# Patient Record
Sex: Male | Born: 1955
Health system: Southern US, Community
[De-identification: ages and names within clinical notes are randomized; demographics above are authoritative.]

## PROBLEM LIST (undated history)

## (undated) DIAGNOSIS — T7840XA Allergy, unspecified, initial encounter: Secondary | ICD-10-CM

## (undated) DIAGNOSIS — E119 Type 2 diabetes mellitus without complications: Secondary | ICD-10-CM

## (undated) DIAGNOSIS — Z9889 Other specified postprocedural states: Secondary | ICD-10-CM

## (undated) DIAGNOSIS — F419 Anxiety disorder, unspecified: Secondary | ICD-10-CM

## (undated) DIAGNOSIS — M1990 Unspecified osteoarthritis, unspecified site: Secondary | ICD-10-CM

## (undated) DIAGNOSIS — Z91018 Allergy to other foods: Secondary | ICD-10-CM

## (undated) DIAGNOSIS — L509 Urticaria, unspecified: Secondary | ICD-10-CM

## (undated) DIAGNOSIS — C61 Malignant neoplasm of prostate: Secondary | ICD-10-CM

## (undated) DIAGNOSIS — Z87442 Personal history of urinary calculi: Secondary | ICD-10-CM

## (undated) DIAGNOSIS — R112 Nausea with vomiting, unspecified: Secondary | ICD-10-CM

## (undated) HISTORY — PX: ADENOIDECTOMY: SUR15

## (undated) HISTORY — DX: Allergy to other foods: Z91.018

## (undated) HISTORY — DX: Allergy, unspecified, initial encounter: T78.40XA

## (undated) HISTORY — DX: Type 2 diabetes mellitus without complications: E11.9

## (undated) HISTORY — DX: Urticaria, unspecified: L50.9

## (undated) HISTORY — PX: COLONOSCOPY: SHX174

## (undated) HISTORY — DX: Malignant neoplasm of prostate: C61

## (undated) HISTORY — PX: TONSILLECTOMY: SUR1361

---

## 1988-02-16 HISTORY — PX: SHOULDER SURGERY: SHX246

## 1998-02-15 HISTORY — PX: CHOLECYSTECTOMY: SHX55

## 2010-02-15 LAB — HM COLONOSCOPY: HM Colonoscopy: NORMAL

## 2013-11-24 ENCOUNTER — Ambulatory Visit: Payer: Self-pay

## 2014-03-18 DIAGNOSIS — C61 Malignant neoplasm of prostate: Secondary | ICD-10-CM

## 2014-03-18 HISTORY — DX: Malignant neoplasm of prostate: C61

## 2014-04-18 ENCOUNTER — Other Ambulatory Visit: Payer: Self-pay | Admitting: Urology

## 2014-05-03 ENCOUNTER — Telehealth: Payer: Self-pay

## 2014-05-03 NOTE — Telephone Encounter (Signed)
LM for patient to return the call.  

## 2014-05-06 ENCOUNTER — Ambulatory Visit (INDEPENDENT_AMBULATORY_CARE_PROVIDER_SITE_OTHER): Payer: 59 | Admitting: Family

## 2014-05-06 ENCOUNTER — Encounter: Payer: Self-pay | Admitting: Family

## 2014-05-06 VITALS — BP 118/78 | HR 80 | Temp 98.4°F | Resp 16 | Ht 68.0 in | Wt 207.8 lb

## 2014-05-06 DIAGNOSIS — E119 Type 2 diabetes mellitus without complications: Secondary | ICD-10-CM | POA: Diagnosis not present

## 2014-05-06 DIAGNOSIS — G4700 Insomnia, unspecified: Secondary | ICD-10-CM

## 2014-05-06 DIAGNOSIS — J302 Other seasonal allergic rhinitis: Secondary | ICD-10-CM

## 2014-05-06 DIAGNOSIS — C61 Malignant neoplasm of prostate: Secondary | ICD-10-CM

## 2014-05-06 DIAGNOSIS — Z0000 Encounter for general adult medical examination without abnormal findings: Secondary | ICD-10-CM

## 2014-05-06 MED ORDER — SITAGLIPTIN PHOSPHATE 100 MG PO TABS: 100.0000 mg | ORAL_TABLET | Freq: Every day | ORAL | Status: DC

## 2014-05-06 MED ORDER — ZOLPIDEM TARTRATE 10 MG PO TABS: 10.0000 mg | ORAL_TABLET | Freq: Every evening | ORAL | Status: DC | PRN

## 2014-05-06 NOTE — Patient Instructions (Signed)
Please schedule fasting lab draw at the front. Stop metformin, start januvia.  Follow up in 3 months. Welcome to [facility]!

## 2014-05-06 NOTE — Progress Notes (Signed)
Subjective:    Patient ID: Larry Davis, male    DOB: 1955/04/14, 59 y.o.   MRN: [id number]  HPI  [name] is a 59 yr old male who presents today to establish care.  1) DM2- was diagnosed 2 years ago. On Metformin. Generally sugars run 160-180. Denies hypoglycemia.  Last A1C was 5 months ago.   2) Prostate cancer- diagnosed a few weeks ago.  Followed at [facility]. He will have robotic prostatectomy with Dr. Tresa Moore in the end of April.  3) allergic rhinitis- reports that he was recent sinus infection and was treated with amoxicillin. He takes Allegra D.  Also using flonase.  Feeling much better.   4) Insomnia- uses ambien rarely.    Review of Systems  Constitutional:       Has gained 8-9 pounds in the last 6 months.    HENT:       Has hearing aids  Eyes:       Wears glasses  Respiratory: Negative for shortness of breath.   Cardiovascular: Negative for chest pain and leg swelling.  Gastrointestinal: Negative for constipation.       Diarrhea secondary to metformin  Genitourinary: Negative for dysuria and frequency.  Musculoskeletal: Positive for arthralgias.       Some right knee pain  Skin: Positive for rash.  Neurological:       Occasional sinus headaches  Hematological: Negative for adenopathy.  Psychiatric/Behavioral:       Denies depression/anxiety   Past Medical History  Diagnosis Date  . Diabetes mellitus without complication   . Prostate cancer 03/2014  . Allergy     History   Social History  . Marital Status: Married    Spouse Name: N/A  . Number of Children: N/A  . Years of Education: N/A   Occupational History  . Not on file.   Social History Main Topics  . Smoking status: Never Smoker   . Smokeless tobacco: Never Used  . Alcohol Use: No  . Drug Use: No  . Sexual Activity: Not on file   Other Topics Concern  . 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 Cholecystectomy  2000  . Shoulder surgery Right 1990    arthroscopy    Family History  Problem Relation Age of Onset  . Heart disease Mother   . Diabetes Mother   . Arthritis Mother   . Hypertension Mother   . Heart disease Father   . Hypertension Father   . Diabetes Father   . Arthritis Father   . Cancer Maternal Uncle     prostate  . Cancer Maternal Grandmother     cervical    No Known Allergies  No current outpatient prescriptions on file prior to visit.   No current facility-administered medications on file prior to visit.    BP 118/78 mmHg  Pulse 80  Temp(Src) 98.4 F (36.9 C) (Oral)  Resp 16  Ht 5\' 8"  (1.727 m)  Wt 207 lb 12.8 oz (94.257 kg)  BMI 31.60 kg/m2  SpO2 97%       Objective:   Physical Exam  Constitutional: He is oriented to person, place, and  time. He appears well-developed and well-nourished. No distress.  HENT:  Head: Normocephalic and atraumatic.  Right Ear: Tympanic membrane and ear canal normal.  Left Ear: Tympanic membrane and ear canal normal.  Eyes: No scleral icterus.  Cardiovascular: Normal rate and regular rhythm.   No murmur heard. Pulmonary/Chest: Effort normal and breath sounds normal. No respiratory distress. He has no wheezes. He has no rales.  Abdominal: Soft. He exhibits no distension. There is no tenderness.  Musculoskeletal: He exhibits no edema.  Lymphadenopathy:    He has no cervical adenopathy.  Neurological: He is alert and oriented to person, place, and time.  Skin: Skin is warm and dry. No rash noted.  Psychiatric: He has a normal mood and affect. His behavior is normal. Thought content normal.          Assessment & Plan:

## 2014-05-06 NOTE — Telephone Encounter (Signed)
Unable to reach pre visit.  

## 2014-05-11 DIAGNOSIS — E1165 Type 2 diabetes mellitus with hyperglycemia: Secondary | ICD-10-CM | POA: Insufficient documentation

## 2014-05-12 DIAGNOSIS — G4700 Insomnia, unspecified: Secondary | ICD-10-CM | POA: Insufficient documentation

## 2014-05-12 DIAGNOSIS — J3089 Other allergic rhinitis: Secondary | ICD-10-CM | POA: Insufficient documentation

## 2014-05-12 DIAGNOSIS — C61 Malignant neoplasm of prostate: Secondary | ICD-10-CM | POA: Insufficient documentation

## 2014-05-12 NOTE — Assessment & Plan Note (Addendum)
Fair control per pt report. Pt will return for A1C, urine microalbumin, lipid panel.

## 2014-05-12 NOTE — Assessment & Plan Note (Signed)
Per pt he will have robotic prostatectomy with Dr. Tresa Moore in the end of April.

## 2014-05-12 NOTE — Assessment & Plan Note (Signed)
Improved with allegra D and flonase.

## 2014-05-12 NOTE — Assessment & Plan Note (Signed)
Stable with rare use of ambien.

## 2014-05-15 ENCOUNTER — Telehealth: Payer: Self-pay | Admitting: *Deleted

## 2014-05-15 NOTE — Telephone Encounter (Signed)
Prior authorization for Januvia initiated. Awaiting determination. JG//CMA

## 2014-05-17 ENCOUNTER — Telehealth: Payer: Self-pay | Admitting: Family

## 2014-05-17 NOTE — Telephone Encounter (Signed)
Please remind pt to schedule fasting lab visit.

## 2014-05-17 NOTE — Telephone Encounter (Signed)
Mailed letter to pt

## 2014-05-20 NOTE — Telephone Encounter (Signed)
PA approved effective 05/15/2014 through 05/15/2015. Approval letter sent for scanning. JG//CMA

## 2014-06-05 ENCOUNTER — Other Ambulatory Visit: Payer: Self-pay | Admitting: Urology

## 2014-06-05 NOTE — Patient Instructions (Addendum)
Larry Davis  06/05/2014   Your procedure is scheduled on: 06/14/2014    Report to [facility] Main  Entrance and follow signs to               [place] at      [phone].  Call this number if you have problems the morning of surgery [phone]   Remember:  Do not eat food or drink liquids :After Midnight.                No diabetic medications am of surgery   Take these medicines the morning of surgery with A SIP OF WATER: Flonas nasal spray                                You may not have any metal on your body including hair pins and              piercings  Do not wear jewelry, , lotions, powders or perfumes, deodorant.  .                        Men may shave face and neck.   Do not bring valuables to the hospital. [place].  Contacts, dentures or bridgework may not be worn into surgery.  Leave suitcase in the car. After surgery it may be brought to your room.         Special Instructions: coughing and deep breathing exercises, leg exercises               Please read over the following fact sheets you were given: _____________________________________________________________________             [facility] - Preparing for Surgery Before surgery, you can play an important role.  Because skin is not sterile, your skin needs to be as free of germs as possible.  You can reduce the number of germs on your skin by washing with CHG (chlorahexidine gluconate) soap before surgery.  CHG is an antiseptic cleaner which kills germs and bonds with the skin to continue killing germs even after washing. Please DO NOT use if you have an allergy to CHG or antibacterial soaps.  If your skin becomes reddened/irritated stop using the CHG and inform your nurse when you arrive at Short Stay. Do not shave (including legs and underarms) for at least 48 hours prior to the first CHG shower.  You may shave your  face/neck. Please follow these instructions carefully:  1.  Shower with CHG Soap the night before surgery and the  morning of Surgery.  2.  If you choose to wash your hair, wash your hair first as usual with your  normal  shampoo.  3.  After you shampoo, rinse your hair and body thoroughly to remove the  shampoo.                           4.  Use CHG as you would any other liquid soap.  You can apply chg directly  to the skin and wash  Gently with a scrungie or clean washcloth.  5.  Apply the CHG Soap to your body ONLY FROM THE NECK DOWN.   Do not use on face/ open                           Wound or open sores. Avoid contact with eyes, ears mouth and genitals (private parts).                       Wash face,  Genitals (private parts) with your normal soap.             6.  Wash thoroughly, paying special attention to the area where your surgery  will be performed.  7.  Thoroughly rinse your body with warm water from the neck down.  8.  DO NOT shower/wash with your normal soap after using and rinsing off  the CHG Soap.                9.  Pat yourself dry with a clean towel.            10.  Wear clean pajamas.            11.  Place clean sheets on your bed the night of your first shower and do not  sleep with pets. Day of Surgery : Do not apply any lotions/deodorants the morning of surgery.  Please wear clean clothes to the hospital/surgery center.  FAILURE TO FOLLOW THESE INSTRUCTIONS MAY RESULT IN THE CANCELLATION OF YOUR SURGERY PATIENT SIGNATURE_________________________________  NURSE SIGNATURE__________________________________  ________________________________________________________________________  WHAT IS A BLOOD TRANSFUSION? Blood Transfusion Information  A transfusion is the replacement of blood or some of its parts. Blood is made up of multiple cells which provide different functions.  Red blood cells carry oxygen and are used for blood loss  replacement.  White blood cells fight against infection.  Platelets control bleeding.  Plasma helps clot blood.  Other blood products are available for specialized needs, such as hemophilia or other clotting disorders. BEFORE THE TRANSFUSION  Who gives blood for transfusions?   Healthy volunteers who are fully evaluated to make sure their blood is safe. This is blood bank blood. Transfusion therapy is the safest it has ever been in the practice of medicine. Before blood is taken from a donor, a complete history is taken to make sure that person has no history of diseases nor engages in risky social behavior (examples are intravenous drug use or sexual activity with multiple partners). The donor's travel history is screened to minimize risk of transmitting infections, such as malaria. The donated blood is tested for signs of infectious diseases, such as HIV and hepatitis. The blood is then tested to be sure it is compatible with you in order to minimize the chance of a transfusion reaction. If you or a relative donates blood, this is often done in anticipation of surgery and is not appropriate for emergency situations. It takes many days to process the donated blood. RISKS AND COMPLICATIONS Although transfusion therapy is very safe and saves many lives, the main dangers of transfusion include:   Getting an infectious disease.  Developing a transfusion reaction. This is an allergic reaction to something in the blood you were given. Every precaution is taken to prevent this. The decision to have a blood transfusion has been considered carefully by your caregiver before blood is given. Blood is not given unless the benefits outweigh  the risks. AFTER THE TRANSFUSION  Right after receiving a blood transfusion, you will usually feel much better and more energetic. This is especially true if your red blood cells have gotten low (anemic). The transfusion raises the level of the red blood cells which  carry oxygen, and this usually causes an energy increase.  The nurse administering the transfusion will monitor you carefully for complications. HOME CARE INSTRUCTIONS  No special instructions are needed after a transfusion. You may find your energy is better. Speak with your caregiver about any limitations on activity for underlying diseases you may have. SEEK MEDICAL CARE IF:   Your condition is not improving after your transfusion.  You develop redness or irritation at the intravenous (IV) site. SEEK IMMEDIATE MEDICAL CARE IF:  Any of the following symptoms occur over the next 12 hours:  Shaking chills.  You have a temperature by mouth above 102 F (38.9 C), not controlled by medicine.  Chest, back, or muscle pain.  People around you feel you are not acting correctly or are confused.  Shortness of breath or difficulty breathing.  Dizziness and fainting.  You get a rash or develop hives.  You have a decrease in urine output.  Your urine turns a dark color or changes to pink, red, or brown. Any of the following symptoms occur over the next 10 days:  You have a temperature by mouth above 102 F (38.9 C), not controlled by medicine.  Shortness of breath.  Weakness after normal activity.  The white part of the eye turns yellow (jaundice). You have a decrease in the amount of urine or are urinating less often.

## 2014-06-06 ENCOUNTER — Encounter (HOSPITAL_COMMUNITY)
Admission: RE | Admit: 2014-06-06 | Discharge: 2014-06-06 | Disposition: A | Discharge status: Home / Self Care | Payer: 59 | Source: Ambulatory Visit | Attending: Urology | Admitting: Urology

## 2014-06-06 ENCOUNTER — Encounter (HOSPITAL_COMMUNITY): Payer: Self-pay

## 2014-06-06 DIAGNOSIS — Z01818 Encounter for other preprocedural examination: Secondary | ICD-10-CM | POA: Diagnosis present

## 2014-06-06 HISTORY — DX: Other specified postprocedural states: R11.2

## 2014-06-06 HISTORY — DX: Other specified postprocedural states: Z98.890

## 2014-06-06 LAB — CBC
HEMATOCRIT: 42.6 % (ref 39.0–52.0)
HEMOGLOBIN: 15.1 g/dL (ref 13.0–17.0)
MCH: 30.4 pg (ref 26.0–34.0)
MCHC: 35.4 g/dL (ref 30.0–36.0)
MCV: 85.9 fL (ref 78.0–100.0)
Platelets: 166 10*3/uL (ref 150–400)
RBC: 4.96 MIL/uL (ref 4.22–5.81)
RDW: 12.6 % (ref 11.5–15.5)
WBC: 7.4 10*3/uL (ref 4.0–10.5)

## 2014-06-06 LAB — BASIC METABOLIC PANEL
Anion gap: 10 (ref 5–15)
BUN: 18 mg/dL (ref 6–23)
CALCIUM: 9.2 mg/dL (ref 8.4–10.5)
CO2: 22 mmol/L (ref 19–32)
Chloride: 105 mmol/L (ref 96–112)
Creatinine, Ser: 0.92 mg/dL (ref 0.50–1.35)
GFR calc non Af Amer: >90 mL/min (ref >90)
GLUCOSE: 297 mg/dL — AB (ref 70–99)
POTASSIUM: 3.9 mmol/L (ref 3.5–5.1)
Sodium: 137 mmol/L (ref 135–145)

## 2014-06-06 NOTE — Progress Notes (Signed)
BMP results done 06/05/2014 faxed via EPIC to Dr Tresa Moore.

## 2014-06-07 NOTE — Progress Notes (Signed)
Final EKG in EPIC done 06/06/2014.

## 2014-06-11 NOTE — Anesthesia Preprocedure Evaluation (Addendum)
Anesthesia Evaluation  Patient identified by MRN, date of birth, ID band Patient awake    Reviewed: Allergy & Precautions, NPO status , Patient's Chart, lab work & pertinent test results, reviewed documented beta blocker date and time   History of Anesthesia Complications (+) PONV  Airway Mallampati: III   Neck ROM: Full    Dental  (+) Teeth Intact, Dental Advisory Given, Chipped   Pulmonary neg pulmonary ROS,  breath sounds clear to auscultation        Cardiovascular Rhythm:Regular  EKG 05/2014 occ PVC, poss old septal MI   Neuro/Psych negative neurological ROS  negative psych ROS   GI/Hepatic   Endo/Other  diabetes, Poorly Controlled, Type 2  Renal/GU      Musculoskeletal   Abdominal (+)  Abdomen: soft.    Peds  Hematology 15/42   Anesthesia Other Findings Chipped top front  Reproductive/Obstetrics                           Anesthesia Physical Anesthesia Plan  ASA: II  Anesthesia Plan: General   Post-op Pain Management:    Induction: Intravenous  Airway Management Planned: Oral ETT  Additional Equipment:   Intra-op Plan:   Post-operative Plan: Extubation in OR  Informed Consent: I have reviewed the patients History and Physical, chart, labs and discussed the procedure including the risks, benefits and alternatives for the proposed anesthesia with the patient or authorized representative who has indicated his/her understanding and acceptance.     Plan Discussed with:   Anesthesia Plan Comments: (Multimodal pain RX)        Anesthesia Quick Evaluation

## 2014-06-14 ENCOUNTER — Inpatient Hospital Stay (HOSPITAL_COMMUNITY): Payer: 59 | Admitting: Anesthesiology

## 2014-06-14 ENCOUNTER — Encounter (HOSPITAL_COMMUNITY): Payer: Self-pay | Admitting: Anesthesiology

## 2014-06-14 ENCOUNTER — Encounter (HOSPITAL_COMMUNITY)
Admission: RE | Disposition: A | Discharge status: Home / Self Care | Payer: Self-pay | Source: Ambulatory Visit | Attending: Urology

## 2014-06-14 ENCOUNTER — Inpatient Hospital Stay (HOSPITAL_COMMUNITY)
Admission: RE | Admit: 2014-06-14 | Discharge: 2014-06-15 | DRG: 708 | Disposition: A | Discharge status: Home / Self Care | Payer: 59 | Source: Ambulatory Visit | Attending: Urology | Admitting: Urology

## 2014-06-14 DIAGNOSIS — E119 Type 2 diabetes mellitus without complications: Secondary | ICD-10-CM | POA: Diagnosis present

## 2014-06-14 DIAGNOSIS — Z833 Family history of diabetes mellitus: Secondary | ICD-10-CM

## 2014-06-14 DIAGNOSIS — Z8249 Family history of ischemic heart disease and other diseases of the circulatory system: Secondary | ICD-10-CM | POA: Diagnosis not present

## 2014-06-14 DIAGNOSIS — Z8042 Family history of malignant neoplasm of prostate: Secondary | ICD-10-CM

## 2014-06-14 DIAGNOSIS — Z01810 Encounter for preprocedural cardiovascular examination: Secondary | ICD-10-CM

## 2014-06-14 DIAGNOSIS — C61 Malignant neoplasm of prostate: Secondary | ICD-10-CM | POA: Diagnosis present

## 2014-06-14 DIAGNOSIS — E291 Testicular hypofunction: Secondary | ICD-10-CM | POA: Diagnosis present

## 2014-06-14 DIAGNOSIS — Z79899 Other long term (current) drug therapy: Secondary | ICD-10-CM | POA: Diagnosis not present

## 2014-06-14 DIAGNOSIS — Z01812 Encounter for preprocedural laboratory examination: Secondary | ICD-10-CM | POA: Diagnosis not present

## 2014-06-14 HISTORY — PX: LYMPHADENECTOMY: SHX5960

## 2014-06-14 HISTORY — PX: ROBOT ASSISTED LAPAROSCOPIC RADICAL PROSTATECTOMY: SHX5141

## 2014-06-14 LAB — GLUCOSE, CAPILLARY
GLUCOSE-CAPILLARY: 275 mg/dL — AB (ref 70–99)
Glucose-Capillary: 230 mg/dL — ABNORMAL HIGH (ref 70–99)
Glucose-Capillary: 259 mg/dL — ABNORMAL HIGH (ref 70–99)
Glucose-Capillary: 282 mg/dL — ABNORMAL HIGH (ref 70–99)
Glucose-Capillary: 283 mg/dL — ABNORMAL HIGH (ref 70–99)
Glucose-Capillary: 247 mg/dL — ABNORMAL HIGH (ref 70–99)
Glucose-Capillary: 187 mg/dL — ABNORMAL HIGH (ref 70–99)

## 2014-06-14 LAB — HEMOGLOBIN AND HEMATOCRIT, BLOOD
HEMATOCRIT: 39 % (ref 39.0–52.0)
Hemoglobin: 13.7 g/dL (ref 13.0–17.0)

## 2014-06-14 LAB — TYPE AND SCREEN
ABO/RH(D): O POS
Antibody Screen: NEGATIVE

## 2014-06-14 LAB — ABO/RH: ABO/RH(D): O POS

## 2014-06-14 SURGERY — ROBOTIC ASSISTED LAPAROSCOPIC RADICAL PROSTATECTOMY
Anesthesia: General

## 2014-06-14 MED ORDER — HYDROMORPHONE HCL 1 MG/ML IJ SOLN
0.2500 mg | INTRAMUSCULAR | Status: DC | PRN
  Administered 2014-06-14 (×2): 0.5 mg via INTRAVENOUS

## 2014-06-14 MED ORDER — FENTANYL CITRATE (PF) 250 MCG/5ML IJ SOLN
INTRAMUSCULAR | Status: AC
  Filled 2014-06-14: qty 5

## 2014-06-14 MED ORDER — INSULIN ASPART 100 UNIT/ML ~~LOC~~ SOLN
SUBCUTANEOUS | Status: AC
  Filled 2014-06-14: qty 1

## 2014-06-14 MED ORDER — LACTATED RINGERS IR SOLN
Status: DC | PRN
  Administered 2014-06-14: 1

## 2014-06-14 MED ORDER — ONDANSETRON HCL 4 MG/2ML IJ SOLN: 4.0000 mg | INTRAMUSCULAR | Status: DC | PRN

## 2014-06-14 MED ORDER — MIDAZOLAM HCL 5 MG/5ML IJ SOLN
INTRAMUSCULAR | Status: DC | PRN
  Administered 2014-06-14: 2 mg via INTRAVENOUS

## 2014-06-14 MED ORDER — INSULIN ASPART 100 UNIT/ML ~~LOC~~ SOLN
8.0000 [IU] | Freq: Once | SUBCUTANEOUS | Status: AC
  Administered 2014-06-14: 8 [IU] via SUBCUTANEOUS

## 2014-06-14 MED ORDER — NEOSTIGMINE METHYLSULFATE 10 MG/10ML IV SOLN
INTRAVENOUS | Status: AC
Start: 2014-06-14 — End: 2014-06-14
  Filled 2014-06-14: qty 1

## 2014-06-14 MED ORDER — FENTANYL CITRATE (PF) 100 MCG/2ML IJ SOLN
25.0000 ug | INTRAMUSCULAR | Status: AC | PRN
  Administered 2014-06-14: 50 ug via INTRAVENOUS
  Administered 2014-06-14 (×2): 25 ug via INTRAVENOUS
  Administered 2014-06-14: 50 ug via INTRAVENOUS
  Administered 2014-06-14 (×2): 25 ug via INTRAVENOUS

## 2014-06-14 MED ORDER — EPHEDRINE SULFATE 50 MG/ML IJ SOLN
INTRAMUSCULAR | Status: AC
  Filled 2014-06-14: qty 1

## 2014-06-14 MED ORDER — KETAMINE HCL 10 MG/ML IJ SOLN
INTRAMUSCULAR | Status: AC
  Filled 2014-06-14: qty 1

## 2014-06-14 MED ORDER — CEFAZOLIN SODIUM-DEXTROSE 2-3 GM-% IV SOLR
INTRAVENOUS | Status: AC
  Filled 2014-06-14: qty 50

## 2014-06-14 MED ORDER — PROMETHAZINE HCL 25 MG/ML IJ SOLN: 6.2500 mg | INTRAMUSCULAR | Status: DC | PRN

## 2014-06-14 MED ORDER — SODIUM CHLORIDE 0.9 % IJ SOLN
INTRAMUSCULAR | Status: AC
  Filled 2014-06-14: qty 10

## 2014-06-14 MED ORDER — LIDOCAINE HCL (CARDIAC) 20 MG/ML IV SOLN
INTRAVENOUS | Status: AC
Start: 2014-06-14 — End: 2014-06-14
  Filled 2014-06-14: qty 10

## 2014-06-14 MED ORDER — MEPERIDINE HCL 50 MG/ML IJ SOLN: 6.2500 mg | INTRAMUSCULAR | Status: DC | PRN

## 2014-06-14 MED ORDER — INSULIN ASPART 100 UNIT/ML ~~LOC~~ SOLN
4.0000 [IU] | Freq: Three times a day (TID) | SUBCUTANEOUS | Status: DC
  Administered 2014-06-14: 4 [IU] via SUBCUTANEOUS

## 2014-06-14 MED ORDER — LINAGLIPTIN 5 MG PO TABS
5.0000 mg | ORAL_TABLET | Freq: Every day | ORAL | Status: DC
Start: 2014-06-14 — End: 2014-06-14
  Filled 2014-06-14 (×2): qty 1

## 2014-06-14 MED ORDER — FENTANYL CITRATE (PF) 100 MCG/2ML IJ SOLN
INTRAMUSCULAR | Status: AC
  Filled 2014-06-14: qty 2

## 2014-06-14 MED ORDER — SCOPOLAMINE 1 MG/3DAYS TD PT72
1.0000 | MEDICATED_PATCH | TRANSDERMAL | Status: DC
  Administered 2014-06-14: 1.5 mg via TRANSDERMAL
  Filled 2014-06-14 (×2): qty 1

## 2014-06-14 MED ORDER — ROCURONIUM BROMIDE 100 MG/10ML IV SOLN
INTRAVENOUS | Status: DC | PRN
  Administered 2014-06-14 (×2): 10 mg via INTRAVENOUS
  Administered 2014-06-14: 40 mg via INTRAVENOUS
  Administered 2014-06-14: 5 mg via INTRAVENOUS

## 2014-06-14 MED ORDER — PROPOFOL 10 MG/ML IV BOLUS
INTRAVENOUS | Status: AC
  Filled 2014-06-14: qty 20

## 2014-06-14 MED ORDER — INSULIN ASPART 100 UNIT/ML ~~LOC~~ SOLN
0.0000 [IU] | Freq: Three times a day (TID) | SUBCUTANEOUS | Status: DC
  Administered 2014-06-14 – 2014-06-15 (×2): 5 [IU] via SUBCUTANEOUS

## 2014-06-14 MED ORDER — ONDANSETRON HCL 4 MG/2ML IJ SOLN
INTRAMUSCULAR | Status: DC | PRN
  Administered 2014-06-14 (×2): 4 mg via INTRAVENOUS

## 2014-06-14 MED ORDER — HYDROMORPHONE HCL 1 MG/ML IJ SOLN
0.5000 mg | INTRAMUSCULAR | Status: DC | PRN
  Administered 2014-06-14: 1 mg via INTRAVENOUS
  Filled 2014-06-14: qty 1

## 2014-06-14 MED ORDER — SODIUM CHLORIDE 0.9 % IJ SOLN
INTRAMUSCULAR | Status: AC
  Filled 2014-06-14: qty 20

## 2014-06-14 MED ORDER — ONDANSETRON HCL 4 MG/2ML IJ SOLN
INTRAMUSCULAR | Status: AC
  Filled 2014-06-14: qty 4

## 2014-06-14 MED ORDER — OXYCODONE HCL 5 MG PO TABS
5.0000 mg | ORAL_TABLET | ORAL | Status: DC | PRN
Start: 2014-06-14 — End: 2014-06-15
  Administered 2014-06-14 – 2014-06-15 (×5): 5 mg via ORAL
  Filled 2014-06-14 (×5): qty 1

## 2014-06-14 MED ORDER — LIDOCAINE HCL (CARDIAC) 20 MG/ML IV SOLN
INTRAVENOUS | Status: DC | PRN
  Administered 2014-06-14: 100 mg via INTRAVENOUS

## 2014-06-14 MED ORDER — ONDANSETRON HCL 4 MG/2ML IJ SOLN
INTRAMUSCULAR | Status: AC
  Filled 2014-06-14: qty 2

## 2014-06-14 MED ORDER — SODIUM CHLORIDE 0.45 % IV SOLN
INTRAVENOUS | Status: DC
  Administered 2014-06-14 – 2014-06-15 (×3): via INTRAVENOUS

## 2014-06-14 MED ORDER — HYDROMORPHONE HCL 1 MG/ML IJ SOLN
INTRAMUSCULAR | Status: AC
  Filled 2014-06-14: qty 1

## 2014-06-14 MED ORDER — ROCURONIUM BROMIDE 100 MG/10ML IV SOLN
INTRAVENOUS | Status: AC
  Filled 2014-06-14: qty 1

## 2014-06-14 MED ORDER — ACETAMINOPHEN 10 MG/ML IV SOLN
1000.0000 mg | Freq: Once | INTRAVENOUS | Status: AC
  Administered 2014-06-14: 1000 mg via INTRAVENOUS
  Filled 2014-06-14: qty 100

## 2014-06-14 MED ORDER — INSULIN ASPART 100 UNIT/ML ~~LOC~~ SOLN
5.0000 [IU] | Freq: Once | SUBCUTANEOUS | Status: AC
  Administered 2014-06-14: 5 [IU] via SUBCUTANEOUS

## 2014-06-14 MED ORDER — KETOROLAC TROMETHAMINE 30 MG/ML IJ SOLN
30.0000 mg | Freq: Four times a day (QID) | INTRAMUSCULAR | Status: DC
  Administered 2014-06-14 – 2014-06-15 (×5): 30 mg via INTRAVENOUS
  Filled 2014-06-14 (×7): qty 1

## 2014-06-14 MED ORDER — DEXMEDETOMIDINE BOLUS VIA INFUSION
INTRAVENOUS | Status: DC | PRN
Start: 2014-06-14 — End: 2014-06-14
  Administered 2014-06-14 (×3): 10 ug via INTRAVENOUS

## 2014-06-14 MED ORDER — NEOSTIGMINE METHYLSULFATE 10 MG/10ML IV SOLN
INTRAVENOUS | Status: DC | PRN
  Administered 2014-06-14: 4 mg via INTRAVENOUS

## 2014-06-14 MED ORDER — PROPOFOL 10 MG/ML IV BOLUS
INTRAVENOUS | Status: DC | PRN
  Administered 2014-06-14: 200 mg via INTRAVENOUS

## 2014-06-14 MED ORDER — GLYCOPYRROLATE 0.2 MG/ML IJ SOLN
INTRAMUSCULAR | Status: AC
  Filled 2014-06-14: qty 2

## 2014-06-14 MED ORDER — BUPIVACAINE LIPOSOME 1.3 % IJ SUSP
20.0000 mL | Freq: Once | INTRAMUSCULAR | Status: AC
  Administered 2014-06-14: 20 mL
  Filled 2014-06-14: qty 20

## 2014-06-14 MED ORDER — DEXMEDETOMIDINE HCL IN NACL 200 MCG/50ML IV SOLN
0.4000 ug/kg/h | INTRAVENOUS | Status: DC
Start: 2014-06-14 — End: 2014-06-15
  Filled 2014-06-14: qty 50

## 2014-06-14 MED ORDER — KETAMINE HCL 10 MG/ML IJ SOLN
INTRAMUSCULAR | Status: DC | PRN
  Administered 2014-06-14: 20 mg via INTRAVENOUS
  Administered 2014-06-14: 10 mg via INTRAVENOUS
  Administered 2014-06-14: 20 mg via INTRAVENOUS

## 2014-06-14 MED ORDER — LACTATED RINGERS IV SOLN
INTRAVENOUS | Status: DC | PRN
  Administered 2014-06-14 (×2): via INTRAVENOUS

## 2014-06-14 MED ORDER — SODIUM CHLORIDE 0.9 % IV BOLUS (SEPSIS)
1000.0000 mL | Freq: Once | INTRAVENOUS | Status: AC
  Administered 2014-06-14: 1000 mL via INTRAVENOUS

## 2014-06-14 MED ORDER — SUCCINYLCHOLINE CHLORIDE 20 MG/ML IJ SOLN
INTRAMUSCULAR | Status: DC | PRN
  Administered 2014-06-14: 100 mg via INTRAVENOUS

## 2014-06-14 MED ORDER — CIPROFLOXACIN HCL 500 MG PO TABS: 500.0000 mg | ORAL_TABLET | Freq: Two times a day (BID) | ORAL | Status: DC

## 2014-06-14 MED ORDER — MIDAZOLAM HCL 2 MG/2ML IJ SOLN
INTRAMUSCULAR | Status: AC
  Filled 2014-06-14: qty 2

## 2014-06-14 MED ORDER — GLYCOPYRROLATE 0.2 MG/ML IJ SOLN
INTRAMUSCULAR | Status: DC | PRN
  Administered 2014-06-14: 0.6 mg via INTRAVENOUS

## 2014-06-14 MED ORDER — MENTHOL 3 MG MT LOZG
1.0000 | LOZENGE | OROMUCOSAL | Status: DC | PRN
  Filled 2014-06-14: qty 9

## 2014-06-14 MED ORDER — HYDROCODONE-ACETAMINOPHEN 5-325 MG PO TABS: 1.0000 | ORAL_TABLET | Freq: Four times a day (QID) | ORAL | Status: DC | PRN

## 2014-06-14 MED ORDER — ACETAMINOPHEN 325 MG PO TABS: 650.0000 mg | ORAL_TABLET | ORAL | Status: DC | PRN

## 2014-06-14 MED ORDER — PHENOL 1.4 % MT LIQD: 1.0000 | OROMUCOSAL | Status: DC | PRN

## 2014-06-14 MED ORDER — FENTANYL CITRATE (PF) 100 MCG/2ML IJ SOLN
INTRAMUSCULAR | Status: DC | PRN
  Administered 2014-06-14 (×3): 50 ug via INTRAVENOUS
  Administered 2014-06-14: 100 ug via INTRAVENOUS
  Administered 2014-06-14: 50 ug via INTRAVENOUS

## 2014-06-14 MED ORDER — CEFAZOLIN SODIUM-DEXTROSE 2-3 GM-% IV SOLR
2.0000 g | INTRAVENOUS | Status: AC
  Administered 2014-06-14: 2 g via INTRAVENOUS

## 2014-06-14 MED ORDER — PHENYLEPHRINE HCL 10 MG/ML IJ SOLN
INTRAMUSCULAR | Status: DC | PRN
  Administered 2014-06-14 (×2): 40 ug via INTRAVENOUS

## 2014-06-14 SURGICAL SUPPLY — 52 items
CABLE HIGH FREQUENCY MONO STRZ (ELECTRODE) ×3 IMPLANT
CATH FOLEY 2WAY SLVR 18FR 30CC (CATHETERS) ×3 IMPLANT
CATH TIEMANN FOLEY 18FR 5CC (CATHETERS) ×3 IMPLANT
CHLORAPREP W/TINT 26ML (MISCELLANEOUS) ×3 IMPLANT
CLIP LIGATING HEM O LOK PURPLE (MISCELLANEOUS) ×6 IMPLANT
CLOTH BEACON ORANGE TIMEOUT ST (SAFETY) ×3 IMPLANT
CONT SPEC 4OZ CLIKSEAL STRL BL (MISCELLANEOUS) ×3 IMPLANT
COVER SURGICAL LIGHT HANDLE (MISCELLANEOUS) ×3 IMPLANT
COVER TIP SHEARS 8 DVNC (MISCELLANEOUS) ×2 IMPLANT
COVER TIP SHEARS 8MM DA VINCI (MISCELLANEOUS) ×1
CUTTER ECHEON FLEX ENDO 45 340 (ENDOMECHANICALS) ×3 IMPLANT
DECANTER SPIKE VIAL GLASS SM (MISCELLANEOUS) ×3 IMPLANT
DRSG TEGADERM 4X4.75 (GAUZE/BANDAGES/DRESSINGS) ×6 IMPLANT
DRSG TEGADERM 6X8 (GAUZE/BANDAGES/DRESSINGS) ×6 IMPLANT
ELECT REM PT RETURN 9FT ADLT (ELECTROSURGICAL) ×3
ELECTRODE REM PT RTRN 9FT ADLT (ELECTROSURGICAL) ×2 IMPLANT
GAUZE SPONGE 2X2 8PLY STRL LF (GAUZE/BANDAGES/DRESSINGS) ×2 IMPLANT
GLOVE BIO SURGEON STRL SZ 6.5 (GLOVE) ×3 IMPLANT
GLOVE BIOGEL M STRL SZ7.5 (GLOVE) ×6 IMPLANT
GLOVE BIOGEL PI IND STRL 7.5 (GLOVE) ×2 IMPLANT
GLOVE BIOGEL PI INDICATOR 7.5 (GLOVE) ×1
GOWN STRL REUS W/TWL LRG LVL3 (GOWN DISPOSABLE) ×6 IMPLANT
GOWN STRL REUS W/TWL LRG LVL4 (GOWN DISPOSABLE) ×9 IMPLANT
HOLDER FOLEY CATH W/STRAP (MISCELLANEOUS) ×3 IMPLANT
IV LACTATED RINGERS 1000ML (IV SOLUTION) ×3 IMPLANT
KIT ACCESSORY DA VINCI DISP (KITS) ×1
KIT ACCESSORY DVNC DISP (KITS) ×2 IMPLANT
KIT PROCEDURE DA VINCI SI (MISCELLANEOUS) ×1
KIT PROCEDURE DVNC SI (MISCELLANEOUS) ×2 IMPLANT
LIQUID BAND (GAUZE/BANDAGES/DRESSINGS) ×3 IMPLANT
NEEDLE INSUFFLATION 14GA 120MM (NEEDLE) ×3 IMPLANT
PACK ROBOT UROLOGY CUSTOM (CUSTOM PROCEDURE TRAY) ×3 IMPLANT
PAD POSITIONING PINK XL (MISCELLANEOUS) IMPLANT
RELOAD GREEN ECHELON 45 (STAPLE) ×3 IMPLANT
SET TUBE IRRIG SUCTION NO TIP (IRRIGATION / IRRIGATOR) ×3 IMPLANT
SHEET LAVH (DRAPES) IMPLANT
SOLUTION ELECTROLUBE (MISCELLANEOUS) ×3 IMPLANT
SPONGE GAUZE 2X2 STER 10/PKG (GAUZE/BANDAGES/DRESSINGS) ×1
SPONGE LAP 4X18 X RAY DECT (DISPOSABLE) ×3 IMPLANT
SUT ETHILON 3 0 PS 1 (SUTURE) ×3 IMPLANT
SUT MNCRL AB 4-0 PS2 18 (SUTURE) ×6 IMPLANT
SUT PDS AB 1 CT1 27 (SUTURE) ×6 IMPLANT
SUT VIC AB 2-0 SH 27 (SUTURE) ×1
SUT VIC AB 2-0 SH 27X BRD (SUTURE) ×2 IMPLANT
SUT VICRYL 0 UR6 27IN ABS (SUTURE) ×3 IMPLANT
SUT VLOC BARB 180 ABS3/0GR12 (SUTURE) ×9
SUTURE VLOC BRB 180 ABS3/0GR12 (SUTURE) ×6 IMPLANT
SYR 27GX1/2 1ML LL SAFETY (SYRINGE) ×3 IMPLANT
TOWEL OR 17X26 10 PK STRL BLUE (TOWEL DISPOSABLE) ×3 IMPLANT
TOWEL OR NON WOVEN STRL DISP B (DISPOSABLE) ×3 IMPLANT
TROCAR 12M 150ML BLUNT (TROCAR) ×3 IMPLANT
WATER STERILE IRR 1500ML POUR (IV SOLUTION) ×6 IMPLANT

## 2014-06-14 NOTE — Anesthesia Procedure Notes (Signed)
Procedure Name: Intubation Date/Time: 06/14/2014 7:23 AM Performed by: Ramirez Fullbright, Virgel Gess Pre-anesthesia Checklist: Patient identified, Emergency Drugs available, Suction available, Patient being monitored and Timeout performed Patient Re-evaluated:Patient Re-evaluated prior to inductionOxygen Delivery Method: Circle system utilized Preoxygenation: Pre-oxygenation with 100% oxygen Intubation Type: IV induction Ventilation: Mask ventilation without difficulty Laryngoscope Size: Mac and 3 Grade View: Grade III Tube type: Oral Tube size: 7.5 mm Number of attempts: 2 Airway Equipment and Method: Stylet Placement Confirmation: ETT inserted through vocal cords under direct vision,  positive ETCO2,  CO2 detector and breath sounds checked- equal and bilateral Secured at: 23 cm Tube secured with: Tape Dental Injury: Teeth and Oropharynx as per pre-operative assessment  Future Recommendations: Recommend- induction with short-acting agent, and alternative techniques readily available Comments: Anterior, small mouth opening with full set of teeth.

## 2014-06-14 NOTE — Transfer of Care (Signed)
Immediate Anesthesia Transfer of Care Note  Patient: Larry Davis  Procedure(s) Performed: Procedure(s): ROBOTIC ASSISTED LAPAROSCOPIC RADICAL PROSTATECTOMY WITH INDOCYANINE GREEN DYE INJECTION (N/A) PELVIC LYMPH NODE DISSECTION (Bilateral)  Patient Location: PACU  Anesthesia Type:General  Level of Consciousness:  sedated, patient cooperative and responds to stimulation  Airway & Oxygen Therapy:Patient Spontanous Breathing and Patient connected to face mask oxgen  Post-op Assessment:  Report given to PACU RN and Post -op Vital signs reviewed and stable  Post vital signs:  Reviewed and stable  Last Vitals:  Filed Vitals:   06/14/14 0519  BP: 133/78  Pulse: 72  Temp: 36.4 C  Resp: 18    Complications: No apparent anesthesia complications

## 2014-06-14 NOTE — H&P (Signed)
Larry Davis is an 59 y.o. male.    Chief Complaint: Pre-Op Robotic Radical Prostatectomy  HPI:   1 - Moderate Risk Prostate Cancer - Mix of Gleason 6 and 3+4=7 adenocarcinoma by prostate biopsy 03/21/14 by Dr. Janice Norrie on evaluation of PSA 7.09 that had risen after starting androgen supplementation. All positive cores right sided including lateral base and apex. He denies erection problems or urinary control problems.   PMH sig for lap chole, DM2 (no neuropathy). His PCP is London Pepper. He also receives some care at [place] in [place].  Today " Larry Davis " is seen to proceed with robotic prostatecotmy. NO interval fevers.   Past Medical History  Diagnosis Date  . Diabetes mellitus without complication   . Prostate cancer 03/2014  . Allergy   . PONV (postoperative nausea and vomiting)     Past Surgical History  Procedure Laterality Date  . Cholecystectomy  2000  . Shoulder surgery Right 1990    arthroscopy    Family History  Problem Relation Age of Onset  . Heart disease Mother   . Diabetes Mother   . Arthritis Mother   . Hypertension Mother   . Heart disease Father   . Hypertension Father   . Diabetes Father   . Arthritis Father   . Cancer Maternal Uncle     prostate  . Cancer Maternal Grandmother     cervical   Social History:  reports that he has never smoked. He has never used smokeless tobacco. He reports that he does not drink alcohol or use illicit drugs.  Allergies: No Known Allergies  Medications Prior to Admission  Medication Sig Dispense Refill  . fluticasone (FLONASE) 50 MCG/ACT nasal spray Place 1 spray into both nostrils daily.    . sitaGLIPtin (JANUVIA) 100 MG tablet Take 1 tablet (100 mg total) by mouth daily. 30 tablet 5  . zolpidem (AMBIEN) 10 MG tablet Take 1 tablet (10 mg total) by mouth at bedtime as needed for sleep. 30 tablet 0    Results for orders placed or performed during the hospital encounter of 06/14/14 (from the past 48 hour(s))  Glucose,  capillary     Status: Abnormal   Collection Time: 06/14/14  5:22 AM  Result Value Ref Range   Glucose-Capillary 230 (H) 70 - 99 mg/dL   Comment 1 Notify RN    No results found.  Review of Systems  Constitutional: Negative.  Negative for fever and chills.  HENT: Negative.   Eyes: Negative.   Respiratory: Negative.   Cardiovascular: Negative.   Gastrointestinal: Negative.   Genitourinary: Negative.   Musculoskeletal: Negative.   Skin: Negative.   Neurological: Negative.   Endo/Heme/Allergies: Negative.   Psychiatric/Behavioral: Negative.     Blood pressure 133/78, pulse 72, temperature 97.6 F (36.4 C), temperature source Oral, resp. rate 18, SpO2 97 %. Physical Exam  Constitutional: He appears well-developed.  HENT:  Head: Normocephalic.  Eyes: Pupils are equal, round, and reactive to light.  Neck: Normal range of motion.  Cardiovascular: Normal rate.   Respiratory: Effort normal.  GI: Soft.  Genitourinary:  No CVAT  Musculoskeletal: Normal range of motion.  Neurological: He is alert.  Skin: Skin is warm.  Psychiatric: He has a normal mood and affect. His behavior is normal. Judgment and thought content normal.     Assessment/Plan  1 - Moderate Risk Prostate Cancer - significant disease in young man, especially worrisome in setting of relative hypogonadism.  Certainly agree aggressive primary therapy warranted. Pt very  well versed in primary management options including radiation, surgery, cryo.   We rediscussed prostatectomy and specifically robotic prostatectomy with bilateral pelvic lymphadenectomy being the technique that I most commonly perform. I showed the patient on their abdomen the approximately 6 small incision (trocar) sites as well as presumed extraction sites with robotic approach as well as possible open incision sites should open conversion be necessary. We rediscussed peri-operative risks including bleeding, infection, deep vein thrombosis, pulmonary  embolism, compartment syndrome, nuropathy / neuropraxia, heart attack, stroke, death, as well as long-term risks such as non-cure / need for additional therapy. We specificallyre addressed that the procedure would compromise urinary control leading to stress incontinence which typically resolves with time and pelvic rehabilitation (Kegel's, etc..), but can sometimes be permanent and require additional therapy including surgery. We also specifically readdressed sexual sequellae including significant erectile dysfunction which typically partially resolves with time but can also be permanent and require additional therapy including surgery.   We rediscussed the typical hospital course including usual 1-2 night hospitalization, discharge with foley catheter in place usually for 1-2 weeks before voiding trial as well as usually 2 week recovery until able to perform most non-strenuous activity and 6 weeks until able to return to most jobs and more strenuous activity such as exercise.   Evone Arseneau 06/14/2014, 5:39 AM

## 2014-06-14 NOTE — Care Management Note (Signed)
    Page 1 of 1   06/14/2014     3:31:52 PM CARE MANAGEMENT NOTE 06/14/2014  Patient:  Larry Davis, Larry Davis   Account Number:  [id number]  Date Initiated:  06/14/2014  Documentation initiated by:  Dessa Phi  Subjective/Objective Assessment:   59 y/o m admitted w/Prostate Ca.     Action/Plan:   From home.   Anticipated DC Date:  06/15/2014   Anticipated DC Plan:  [place]  CM consult      Choice offered to / List presented to:             Status of service:  In process, will continue to follow Medicare Important Message given?   (If response is "NO", the following Medicare IM given date fields will be blank) Date Medicare IM given:   Medicare IM given by:   Date Additional Medicare IM given:   Additional Medicare IM given by:    Discharge Disposition:    Per UR Regulation:  Reviewed for med. necessity/level of care/duration of stay  If discussed at [place] of Stay Meetings, dates discussed:    Comments:  06/14/14 Dessa Phi RN BSN NCM [phone] s/P Prostatectomy.No anticipated d/c needs.

## 2014-06-14 NOTE — Discharge Instructions (Signed)

## 2014-06-14 NOTE — Brief Op Note (Signed)
06/14/2014  10:19 AM  PATIENT:  Gwenlyn Fudge  59 y.o. male  PRE-OPERATIVE DIAGNOSIS:  PROSTATE CANCER  POST-OPERATIVE DIAGNOSIS:  PROSTATE CANCER  PROCEDURE:  Procedure(s): ROBOTIC ASSISTED LAPAROSCOPIC RADICAL PROSTATECTOMY WITH INDOCYANINE GREEN DYE INJECTION (N/A) PELVIC LYMPH NODE DISSECTION (Bilateral)  SURGEON:  Surgeon(s) and Role:    * Alexis Frock, MD - Primary  PHYSICIAN ASSISTANT:   ASSISTANTS: Clemetine Marker, PA   ANESTHESIA:   local and general  EBL:  Total I/O In: 1200 [I.V.:1200] Out: 300 [Urine:150; Blood:150]  BLOOD ADMINISTERED:none  DRAINS: 1 - foley, 2 - JP   LOCAL MEDICATIONS USED:  MARCAINE     SPECIMEN:  Source of Specimen:  1 - pelvic lymph nodes, 2- radical prostatectomy, 3 - rt distal urethral margin  DISPOSITION OF SPECIMEN:  PATHOLOGY  COUNTS:  YES  TOURNIQUET:  * No tourniquets in log *  DICTATION: .Other Dictation: Dictation Number [id number]  PLAN OF CARE: Admit to inpatient   PATIENT DISPOSITION:  PACU - hemodynamically stable.   Delay start of Pharmacological VTE agent (>24hrs) due to surgical blood loss or risk of bleeding: yes

## 2014-06-14 NOTE — Anesthesia Postprocedure Evaluation (Signed)
  Anesthesia Post-op Note  Patient: Larry Davis  Procedure(s) Performed: Procedure(s): ROBOTIC ASSISTED LAPAROSCOPIC RADICAL PROSTATECTOMY WITH INDOCYANINE GREEN DYE INJECTION (N/A) PELVIC LYMPH NODE DISSECTION (Bilateral)  Patient Location: PACU  Anesthesia Type:General  Level of Consciousness: awake, alert  and oriented  Airway and Oxygen Therapy: Patient Spontanous Breathing and Patient connected to nasal cannula oxygen  Post-op Pain: mild  Post-op Assessment: Post-op Vital signs reviewed, Patient's Cardiovascular Status Stable, PATIENT'S CARDIOVASCULAR STATUS UNSTABLE, Patent Airway and No signs of Nausea or vomiting  Post-op Vital Signs: Reviewed and stable  Last Vitals:  Filed Vitals:   06/14/14 1100  BP: 159/88  Pulse: 69  Temp:   Resp: 21    Complications: No apparent anesthesia complications

## 2014-06-15 LAB — CREATININE, FLUID (PLEURAL, PERITONEAL, JP DRAINAGE): Creat, Fluid: 0.9 mg/dL

## 2014-06-15 LAB — BASIC METABOLIC PANEL
Anion gap: 8 (ref 5–15)
BUN: 14 mg/dL (ref 6–23)
CALCIUM: 8.5 mg/dL (ref 8.4–10.5)
CHLORIDE: 101 mmol/L (ref 96–112)
CO2: 27 mmol/L (ref 19–32)
Creatinine, Ser: 0.86 mg/dL (ref 0.50–1.35)
GFR calc Af Amer: >90 mL/min (ref >90)
GFR calc non Af Amer: >90 mL/min (ref >90)
GLUCOSE: 171 mg/dL — AB (ref 70–99)
POTASSIUM: 3.8 mmol/L (ref 3.5–5.1)
SODIUM: 136 mmol/L (ref 135–145)

## 2014-06-15 LAB — HEMOGLOBIN AND HEMATOCRIT, BLOOD
HEMATOCRIT: 36.5 % — AB (ref 39.0–52.0)
HEMOGLOBIN: 12.5 g/dL — AB (ref 13.0–17.0)

## 2014-06-15 LAB — GLUCOSE, CAPILLARY
GLUCOSE-CAPILLARY: 189 mg/dL — AB (ref 70–99)
GLUCOSE-CAPILLARY: 206 mg/dL — AB (ref 70–99)

## 2014-06-15 MED ORDER — OXYCODONE-ACETAMINOPHEN 5-325 MG PO TABS: 1.0000 | ORAL_TABLET | Freq: Four times a day (QID) | ORAL | Status: DC | PRN

## 2014-06-15 MED ORDER — SENNOSIDES-DOCUSATE SODIUM 8.6-50 MG PO TABS: 1.0000 | ORAL_TABLET | Freq: Two times a day (BID) | ORAL | Status: DC

## 2014-06-15 NOTE — Discharge Summary (Signed)
Physician Discharge Summary  Patient ID: Larry Davis MRN: [id number] DOB/AGE: Feb 09, 1956 59 y.o.  Admit date: 06/14/2014 Discharge date: 06/15/2014  Admission Diagnoses: Prostate Cancer  Discharge Diagnoses:  Active Problems:   Prostate cancer   Discharged Condition: good  Hospital Course:   1 - Moderate Risk Prostate Cancer - s/p robotic prostatectomy with bilateral pelvic lymphadenectomy on 4/29, the day of admission, without acute complications. By POD 1, the day of discharge, pt is ambulatory, tollerating regular diet, pain controlled on PO meds, and felt to be adequate for discharge. JP removed prior to discharge as output scant and fluid Cr consistent with serum.   Consults: None  Significant Diagnostic Studies: labs: Hgb >10, surgical pathology pending.  Treatments: surgery: robotic prostatectomy with bilateral pelvic lymphadenectomy on 06/14/2014  Discharge Exam: Blood pressure 115/54, pulse 61, temperature 98.7 F (37.1 C), temperature source Oral, resp. rate 16, height 5\' 8"  (1.727 m), weight 92.987 kg (205 lb), SpO2 95 %. General appearance: alert, cooperative and appears stated age Head: Normocephalic, without obvious abnormality, atraumatic Nose: Nares normal. Septum midline. Mucosa normal. No drainage or sinus tenderness. Throat: lips, mucosa, and tongue normal; teeth and gums normal Back: symmetric, no curvature. ROM normal. No CVA tenderness. Resp: non-labored on room air Cardio: Nl rate GI: soft, non-tender; bowel sounds normal; no masses,  no organomegaly Male genitalia: normal, foley c/d/i with light pink urine, no clots Extremities: extremities normal, atraumatic, no cyanosis or edema Pulses: 2+ and symmetric Skin: Skin color, texture, turgor normal. No rashes or lesions Lymph nodes: Cervical, supraclavicular, and axillary nodes normal. Neurologic: Grossly normal Incision/Wound: recent port stites / extraction sites c/d/i. JP removed and dry dressing  applied.   Disposition: Final discharge disposition not confirmed     Medication List    TAKE these medications        ciprofloxacin 500 MG tablet  Commonly known as:  CIPRO  Take 1 tablet (500 mg total) by mouth 2 (two) times daily. Start day prior to office visit for foley removal     fluticasone 50 MCG/ACT nasal spray  Commonly known as:  FLONASE  Place 1 spray into both nostrils daily.     oxyCODONE-acetaminophen 5-325 MG per tablet  Commonly known as:  ROXICET  Take 1-2 tablets by mouth every 6 (six) hours as needed for moderate pain or severe pain. Post-operatively     senna-docusate 8.6-50 MG per tablet  Commonly known as:  Senokot-S  Take 1 tablet by mouth 2 (two) times daily. While taking pain meds to prevent constipation.     sitaGLIPtin 100 MG tablet  Commonly known as:  JANUVIA  Take 1 tablet (100 mg total) by mouth daily.     zolpidem 10 MG tablet  Commonly known as:  AMBIEN  Take 1 tablet (10 mg total) by mouth at bedtime as needed for sleep.           Follow-up Information    Follow up with Alexis Frock, MD On 06/21/2014.   Specialty:  Urology   Why:  at 1:15   Contact information:   [place] [address] [phone]       Signed: Alexis Frock 06/15/2014, 2:18 PM

## 2014-06-15 NOTE — Op Note (Signed)
NAME:  Larry Davis, Larry Davis NO.:  [id number]  MEDICAL RECORD NO.:  [id number]  LOCATION:  40                         FACILITY:  [facility]  PHYSICIAN:  Alexis Frock, MD     DATE OF BIRTH:  1956/01/30  DATE OF PROCEDURE: 06/14/2014                               OPERATIVE REPORT  DIAGNOSIS:  Moderate-risk prostate cancer.  PROCEDURES: 1. Robotic-assisted laparoscopic radical prostatectomy. 2. Injection of indocyanine green dye for sentinel lymphangiography. 3. Bilateral pelvic lymph node dissection, laparoscopic.  ESTIMATED BLOOD LOSS:  150 mL.  COMPLICATIONS:  None.  SPECIMENS: 1. Right external iliac lymph node, sentinel. 2. Right obturator lymph node, sentinel. 3. Left external iliac lymph node, sentinel. 4. Left obturator lymph node, sentinel. 5. Radical prostatectomy. 6. Right distal urethral margin frozen section negative.  FINDINGS: 1. Several small hyperfluorescent lymph just in the left external,     left obturator, right external, right obturator packets     respectively. 2. Grossly enlarged right external iliac lymph node with blue     discoloration, this was felt to likely represent uptake from     previous right lower extremity tattoo.  ASSISTANT:  Clemetine Marker, PA.  DRAINS: 1. Jackson-Pratt drain to bulb suction. 2. Foley catheter to straight drain.  INDICATION:  Larry Davis is a pleasant 59 year old gentleman, who was found on workup of elevated PSA to have multifocal moderate-risk prostate cancer.  This was mostly on his right side including right lateral and right apex positivity.  Options were discussed for management including ablative therapies versus surveillance protocol versus surgical extirpation with and without minimally-invasive assistance and minimally wished to proceed with robotic prostatectomy. Informed consent was obtained and placed in the medical record.  PROCEDURE IN DETAIL:  Patient being Larry Davis was  verified. Procedure being robotic prostatectomy was confirmed.  Procedure was carried out.  Time-out was performed.  Intravenous antibiotics were administered.  General endotracheal anesthesia was induced.  The patient was placed into a low lithotomy position and sterile field was created by prepping and draping the patient's penis, perineum, and proximal thighs using iodine in his infra-xiphoid abdomen using chlorhexidine gluconate.  A test of steep Trendelenburg position was performed and was found to be suitably positioned.  He was further fashioned on the operative table using 3-inch tape over across his chest.  Next, a high- flow, low-pressure pneumoperitoneum was obtained using Veress technique in the infraumbilical midline having passed the aspiration and drop test.  After Foley catheter had been placed, a 12-mm robotic camera port was placed in the same location.  Laparoscopic examination of the peritoneal cavity revealed no significant adhesions and no visceral injury.  Additional ports were then placed as follows; right paramedian 8-mm robotic port, right far lateral 12-mm assistant port, right paramedian 5-mm suction port, left paramedian 8-mm robotic port, left far lateral 8-mm robotic port.  Robot was docked and passed through the electronic checks.  Next, attention was directed to the development of the space of Retzius.  Incision was made lateral to the right medial umbilical ligament near the midline towards the area of the internal ring and then following the contour of the iliac vessels.  The vas deferens was  encountered, doubly ligated and placed on gentle medial traction.  Dissection was proceeded within this plane, sweeping the bladder away from the pelvic sidewall towards the area of the endopelvic fascia, which was carefully swept away from the lateral aspect of the prostate with base-to-apex orientation.  A mirror-imaged dissection was performed on the left side,  transecting purposely the left vas deferens and sweeping the left bladder wall away from the pelvic sidewall and skipping the left prostate away from the pelvic sidewall as well. Anterior attachments were taken down using cautery scissors.  This exposed the anterior base of the prostate, which was defatted and set aside for permanent pathology, labeled the periprostatic fat.  This exposed the area of the dorsal venous complex, which was controlled using the vascular load stapler.  This resulted in excellent hemostasis of the structure.  Prostate was identified moving the Foley catheter back and forth.  Attention was directed to the indocyanine green dye injection.  Using robotic guidance, a 0.2 mL of indocyanine green dye were injected via a spinal needle that was placed just superior to the pubic ramus with intervening suction and point coagulation current to avoid dye spillage, which did not occur.  Attention was then directed at lymphadenectomy.  First on the left side, all fiber fatty tissue in the confines of left external iliac artery, vein, pelvic sidewall and iliac. Bifurcation was carefully mobilized.  Lymphostasis was achieved with cold clips.  There was a single dominant area in the more proximal aspect of this that was hyperfluorescent.  Therefore, this packet was set aside, labelled the left external iliac lymph node, sentinel.  Next, all fiber fatty tissue in the confines of the left external iliac artery, obturator nerve and pelvic side wall were carefully mobilized. Lymphostasis was achieved with cold clips.  This was set aside, labeled left obturator lymph node, sentinel as there was one hyperfluorescent nodes also within this packet.  The obturator nerve was inspected following these maneuvers and found to be uninjured.  A mirror-imaged lymphadenectomy was performed on the right side using the exact same borders.  Notably on the right external iliac, there was a  dominant, quite enlarged lymph node, at least 3-4 cm or so.  This had a blue discoloration grossly.  The patient has known lower extremity tattoos, this would likely represent uptake of this in the right external iliac lymph node and right obturator lymph node packets were set aside separately.  The right obturator nerve was inspected following these maneuvers and was found to be uninjured.  Attention was directed to the bladder neck dissection.  The bladder neck was identified moving the Foley catheter back and forth and in the anterior plane.  The bladder neck was carefully separated from the base of the prostate anterior- posterior direction, taking great care to avoid excessive bladder neck caliber, this did not occur.  Posterior dissection was performed by incising approximately 7 mm inferior-posterior to the posterior lip of the prostate and injuring the plane of Denonvilliers.  The bilateral vas deferens were encountered.  As such, total distance approximately 4 cm, placed on gentle superior traction.  Bilateral seminal vesicles were also dissected towards their tips and placed on gentle superior traction.  Dissection proceeded towards the area of the apex of the prostate sweeping the perirectal fat and Denonvilliers fascia inferiorly, this exposed the neurovascular pedicles on the right side. Purposeful wide dissection was performed with hemostasis being achieved with cold clips in a base-to-apex orientation.  There was no  nerve sparing performed on the right side and the left side.  Partial nerve sparing was performed by initially controlling the obvious vascular tissue using cold clips and then sweeping away the presumed neurovascular bundles away from the anterolateral and apical aspects of the prostate.  The prostate was placed on gentle superior traction. This exposed the membranous urethral stump, which was coldly transected. Given the known right apex positivity, frozen  section was sent from the right distal ureteral margin, this was found to be negative for carcinoma.  Posterior reconstruction was performed after a digital rectal exam was performed using indicator glove under laparoscopic vision, which revealed no evidence of rectal injury.  Posterior reconstruction was performed using a single V-Loc suture reapproximating the posterior urethral plate to the posterior bladder neck, brought these structures in tension-free apposition.  Next, mucosa-to-mucosa anastomosis was performed using double-armed V-Loc suture reapproximating the membranous urethral stump to the bladder neck, resulted in excellent tension-free apposition.  New Foley catheter was then placed, was irrigated quantitatively.  This anastomotic suture was anchored to the puboprostatic ligament thus performing anterior reconstruction.  At this point, all sponge and needle counts were correct.  Hemostasis appeared excellent.  Closed suction drain was brought through the previous left lateral most robotic port site near the peritoneal cavity.  Robot was then undocked.  Specimen was retrieved by extending the previous 12-mm camera port site for total distance of approximately 4 cm, removing the prostatectomy specimen and setting aside for permanent pathology.  Just prior to extraction, the right lateral most robotic port site was closed at the level of the fascia using a suture passer.  The extraction site was closed at the level of the fascia using figure-of-eight PDS x3 followed by refractory Scarpa's using 0 Vicryl.  All incision sites were infiltrated with dilute lyophilized Marcaine.  Drain stitch was applied, and the skin was reapproximated using subcuticular Monocryl followed by Dermabond. Procedure was then terminated.  The patient tolerated the procedure well.  There were no immediate periprocedural occasions.  The patient was taken to the postanesthesia care unit in a stable  condition.          ______________________________ Alexis Frock, MD     TM/MEDQ  D:  06/14/2014  T:  06/15/2014  Job:  [id number]

## 2014-06-17 ENCOUNTER — Encounter (HOSPITAL_COMMUNITY): Payer: Self-pay | Admitting: Urology

## 2014-07-30 ENCOUNTER — Other Ambulatory Visit (INDEPENDENT_AMBULATORY_CARE_PROVIDER_SITE_OTHER): Payer: 59

## 2014-07-30 DIAGNOSIS — Z0000 Encounter for general adult medical examination without abnormal findings: Secondary | ICD-10-CM | POA: Diagnosis not present

## 2014-07-30 LAB — URINALYSIS, ROUTINE W REFLEX MICROSCOPIC
Bilirubin Urine: NEGATIVE
Hgb urine dipstick: NEGATIVE
Ketones, ur: NEGATIVE
LEUKOCYTES UA: NEGATIVE
Nitrite: NEGATIVE
SPECIFIC GRAVITY, URINE: 1.025 (ref 1.000–1.030)
Total Protein, Urine: NEGATIVE
UROBILINOGEN UA: 0.2 (ref 0.0–1.0)
Urine Glucose: NEGATIVE
pH: 6 (ref 5.0–8.0)

## 2014-07-30 LAB — HEPATIC FUNCTION PANEL
ALT: 20 U/L (ref 0–53)
AST: 14 U/L (ref 0–37)
Albumin: 4.2 g/dL (ref 3.5–5.2)
Alkaline Phosphatase: 57 U/L (ref 39–117)
BILIRUBIN DIRECT: 0.1 mg/dL (ref 0.0–0.3)
BILIRUBIN TOTAL: 0.6 mg/dL (ref 0.2–1.2)
TOTAL PROTEIN: 6.8 g/dL (ref 6.0–8.3)

## 2014-07-30 LAB — CBC WITH DIFFERENTIAL/PLATELET
BASOS PCT: 0.7 % (ref 0.0–3.0)
Basophils Absolute: 0.1 10*3/uL (ref 0.0–0.1)
EOS ABS: 0.3 10*3/uL (ref 0.0–0.7)
EOS PCT: 3.2 % (ref 0.0–5.0)
HEMATOCRIT: 44 % (ref 39.0–52.0)
Hemoglobin: 15.1 g/dL (ref 13.0–17.0)
LYMPHS ABS: 2.8 10*3/uL (ref 0.7–4.0)
Lymphocytes Relative: 35.6 % (ref 12.0–46.0)
MCHC: 34.3 g/dL (ref 30.0–36.0)
MCV: 87.9 fl (ref 78.0–100.0)
Monocytes Absolute: 0.5 10*3/uL (ref 0.1–1.0)
Monocytes Relative: 6.8 % (ref 3.0–12.0)
Neutro Abs: 4.3 10*3/uL (ref 1.4–7.7)
Neutrophils Relative %: 53.7 % (ref 43.0–77.0)
Platelets: 204 10*3/uL (ref 150.0–400.0)
RBC: 5.01 Mil/uL (ref 4.22–5.81)
RDW: 13.6 % (ref 11.5–15.5)
WBC: 8 10*3/uL (ref 4.0–10.5)

## 2014-07-30 LAB — BASIC METABOLIC PANEL
BUN: 16 mg/dL (ref 6–23)
CHLORIDE: 105 meq/L (ref 96–112)
CO2: 26 mEq/L (ref 19–32)
Calcium: 9.3 mg/dL (ref 8.4–10.5)
Creatinine, Ser: 0.98 mg/dL (ref 0.40–1.50)
GFR: 83.4 mL/min (ref >60.00)
Glucose, Bld: 191 mg/dL — ABNORMAL HIGH (ref 70–99)
Potassium: 4 mEq/L (ref 3.5–5.1)
Sodium: 136 mEq/L (ref 135–145)

## 2014-07-30 LAB — LIPID PANEL
CHOL/HDL RATIO: 5
CHOLESTEROL: 171 mg/dL (ref 0–200)
HDL: 36 mg/dL — AB (ref >39.00)
LDL Cholesterol: 117 mg/dL — ABNORMAL HIGH (ref 0–99)
NonHDL: 135
Triglycerides: 92 mg/dL (ref 0.0–149.0)
VLDL: 18.4 mg/dL (ref 0.0–40.0)

## 2014-07-30 LAB — TSH: TSH: 1.04 u[IU]/mL (ref 0.35–4.50)

## 2014-07-31 ENCOUNTER — Other Ambulatory Visit (INDEPENDENT_AMBULATORY_CARE_PROVIDER_SITE_OTHER): Payer: 59

## 2014-07-31 DIAGNOSIS — E119 Type 2 diabetes mellitus without complications: Secondary | ICD-10-CM | POA: Diagnosis not present

## 2014-07-31 LAB — HEMOGLOBIN A1C: HEMOGLOBIN A1C: 7.5 % — AB (ref 4.6–6.5)

## 2014-08-12 ENCOUNTER — Ambulatory Visit (INDEPENDENT_AMBULATORY_CARE_PROVIDER_SITE_OTHER): Payer: 59 | Admitting: Family

## 2014-08-12 ENCOUNTER — Encounter: Payer: Self-pay | Admitting: Family

## 2014-08-12 VITALS — BP 118/74 | HR 65 | Temp 98.0°F | Ht 68.0 in | Wt 199.0 lb

## 2014-08-12 DIAGNOSIS — C61 Malignant neoplasm of prostate: Secondary | ICD-10-CM | POA: Diagnosis not present

## 2014-08-12 DIAGNOSIS — G4700 Insomnia, unspecified: Secondary | ICD-10-CM

## 2014-08-12 DIAGNOSIS — E119 Type 2 diabetes mellitus without complications: Secondary | ICD-10-CM

## 2014-08-12 MED ORDER — ZOLPIDEM TARTRATE 10 MG PO TABS: 10.0000 mg | ORAL_TABLET | Freq: Every evening | ORAL | Status: DC | PRN

## 2014-08-12 NOTE — Assessment & Plan Note (Signed)
A1C up slightly. Continue [place]. Reinforced diet/exercise.

## 2014-08-12 NOTE — Assessment & Plan Note (Signed)
S/p prostatectomy. Advised pt to keep his upcoming apt. With urology.

## 2014-08-12 NOTE — Progress Notes (Signed)
Pre visit review using our clinic review tool, if applicable. No additional management support is needed unless otherwise documented below in the visit note. 

## 2014-08-12 NOTE — Patient Instructions (Signed)
Follow up in 3 months

## 2014-08-12 NOTE — Assessment & Plan Note (Signed)
Stable on prn ambien, refill provided.

## 2014-08-12 NOTE — Progress Notes (Signed)
Subjective:    Patient ID: Larry Davis, male    DOB: 1955-11-24, 59 y.o.   MRN: [id number]  HPI  Larry Davis is a 59 yr old male who presents today for follow up.  1) DM2-  Lab Results  Component Value Date   HGBA1C 7.5* 07/31/2014   Lab Results  Component Value Date   LDLCALC 117* 07/30/2014   CREATININE 0.98 07/30/2014   2) Insomnia-  On ambien.reports that this works well and only uses as needed.    3) Allergic rhinitis- reports stable with otc flonase.   4) prostate cancer- s/p robotic prostatectomy.  Having some incontinence and ED- has follow up with urology.    Review of Systems See HPI  Past Medical History  Diagnosis Date  . Diabetes mellitus without complication   . Prostate cancer 03/2014  . Allergy   . PONV (postoperative nausea and vomiting)     History   Social History  . Marital Status: Married    Spouse Name: N/A  . Number of Children: N/A  . Years of Education: N/A   Occupational History  . Not on file.   Social History Main Topics  . Smoking status: Never Smoker   . Smokeless tobacco: Never Used  . Alcohol Use: No  . Drug Use: No  . Sexual Activity: Not on file   Other Topics Concern  . 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 Cholecystectomy  2000  . Shoulder surgery Right 1990    arthroscopy  . Robot assisted laparoscopic radical prostatectomy N/A 06/14/2014    Procedure: ROBOTIC ASSISTED LAPAROSCOPIC RADICAL PROSTATECTOMY WITH INDOCYANINE GREEN DYE INJECTION;  Surgeon: Alexis Frock, MD;  Location: [facility];  Service: Urology;  Laterality: N/A;  . Lymphadenectomy Bilateral 06/14/2014    Procedure: PELVIC LYMPH NODE DISSECTION;  Surgeon: Alexis Frock, MD;  Location:  [facility];  Service: Urology;  Laterality: Bilateral;    Family History  Problem Relation Age of Onset  . Heart disease Mother   . Diabetes Mother   . Arthritis Mother   . Hypertension Mother   . Heart disease Father   . Hypertension Father   . Diabetes Father   . Arthritis Father   . Cancer Maternal Uncle     prostate  . Cancer Maternal Grandmother     cervical    No Known Allergies  Current Outpatient Prescriptions on File Prior to Visit  Medication Sig Dispense Refill  . sitaGLIPtin (JANUVIA) 100 MG tablet Take 1 tablet (100 mg total) by mouth daily. 30 tablet 5  . zolpidem (AMBIEN) 10 MG tablet Take 1 tablet (10 mg total) by mouth at bedtime as needed for sleep. 30 tablet 0   No current facility-administered medications on file prior to visit.    BP 118/74 mmHg  Pulse 65  Temp(Src) 98 F (36.7 C) (Oral)  Ht 5\' 8"  (1.727 m)  Wt 199 lb (90.266 kg)  BMI 30.26 kg/m2  SpO2 95%       Objective:   Physical Exam  Constitutional: He is oriented to person, place, and time. He appears well-developed and well-nourished. No distress.  HENT:  Head: Normocephalic and atraumatic.  Cardiovascular: Normal rate and regular rhythm.   No murmur heard. Pulmonary/Chest: Effort normal and breath sounds normal. No respiratory distress. He has no wheezes. He has no rales.  Musculoskeletal: He exhibits no edema.  Neurological: He is alert and oriented to person, place, and time.  Skin: Skin is warm and dry.  Psychiatric: He has a normal mood and affect. His behavior is normal. Thought content normal.          Assessment & Plan:

## 2014-10-18 ENCOUNTER — Telehealth: Payer: Self-pay | Admitting: Family

## 2014-10-18 MED ORDER — SITAGLIPTIN PHOSPHATE 100 MG PO TABS: 100.0000 mg | ORAL_TABLET | Freq: Every day | ORAL | Status: DC

## 2014-10-18 NOTE — Telephone Encounter (Signed)
Spoke with pt and advised him I will sent 90 day supply of Januvia but zolpidem can only be done every 30 days. Pt voices understanding and states he is not needing a refill of zolpidem at this time. Scheduled f/u with PCP for 11/13/14 at 7am.

## 2014-10-18 NOTE — Telephone Encounter (Signed)
Caller name: Vue Pavon  Relationship to patient: Self  Can be reached: [phone]  Pharmacy:  Reason for call: pt would like to have a 90 day supply on his medication instead of a 30 days supply.   Please advise.

## 2014-11-13 ENCOUNTER — Encounter: Payer: Self-pay | Admitting: Family

## 2014-11-13 ENCOUNTER — Ambulatory Visit (INDEPENDENT_AMBULATORY_CARE_PROVIDER_SITE_OTHER): Payer: 59 | Admitting: Family

## 2014-11-13 VITALS — BP 130/88 | HR 68 | Temp 97.9°F | Resp 16 | Ht 68.0 in | Wt 209.2 lb

## 2014-11-13 DIAGNOSIS — E119 Type 2 diabetes mellitus without complications: Secondary | ICD-10-CM

## 2014-11-13 DIAGNOSIS — G4700 Insomnia, unspecified: Secondary | ICD-10-CM

## 2014-11-13 DIAGNOSIS — E785 Hyperlipidemia, unspecified: Secondary | ICD-10-CM | POA: Diagnosis not present

## 2014-11-13 DIAGNOSIS — Z23 Encounter for immunization: Secondary | ICD-10-CM

## 2014-11-13 LAB — BASIC METABOLIC PANEL
BUN: 14 mg/dL (ref 6–23)
CALCIUM: 9.7 mg/dL (ref 8.4–10.5)
CO2: 27 mEq/L (ref 19–32)
Chloride: 103 mEq/L (ref 96–112)
Creatinine, Ser: 0.94 mg/dL (ref 0.40–1.50)
GFR: 87.42 mL/min (ref >60.00)
GLUCOSE: 262 mg/dL — AB (ref 70–99)
Potassium: 4.3 mEq/L (ref 3.5–5.1)
SODIUM: 138 meq/L (ref 135–145)

## 2014-11-13 LAB — MICROALBUMIN / CREATININE URINE RATIO
Creatinine,U: 127.9 mg/dL
MICROALB UR: 1 mg/dL (ref 0.0–1.9)
MICROALB/CREAT RATIO: 0.8 mg/g (ref 0.0–30.0)

## 2014-11-13 LAB — LIPID PANEL
Cholesterol: 192 mg/dL (ref 0–200)
HDL: 36.5 mg/dL — AB (ref >39.00)
NonHDL: 155.39
TRIGLYCERIDES: 241 mg/dL — AB (ref 0.0–149.0)
Total CHOL/HDL Ratio: 5
VLDL: 48.2 mg/dL — ABNORMAL HIGH (ref 0.0–40.0)

## 2014-11-13 LAB — LDL CHOLESTEROL, DIRECT: LDL DIRECT: 142 mg/dL

## 2014-11-13 LAB — HEMOGLOBIN A1C: HEMOGLOBIN A1C: 8.7 % — AB (ref 4.6–6.5)

## 2014-11-13 NOTE — Progress Notes (Signed)
Subjective:    Patient ID: Larry Davis, male    DOB: 1956-01-28, 59 y.o.   MRN: [id number]  HPI  Larry Davis is a 59 yr old male who presents today for 3 month follow up.    1) DM2-  Last eye exam was 2 years ago.    Lab Results  Component Value Date   HGBA1C 7.5* 07/31/2014   Lab Results  Component Value Date   LDLCALC 117* 07/30/2014   CREATININE 0.98 07/30/2014   2) Insomnia- uses ambien prn.    Review of Systems Past Medical History  Diagnosis Date  . Diabetes mellitus without complication   . Prostate cancer 03/2014  . Allergy   . PONV (postoperative nausea and vomiting)     Social History   Social History  . Marital Status: Married    Spouse Name: N/A  . Number of Children: N/A  . Years of Education: N/A   Occupational History  . Not on file.   Social History Main Topics  . Smoking status: Never Smoker   . Smokeless tobacco: Never Used  . Alcohol Use: No  . Drug Use: No  . Sexual Activity: Not on file   Other Topics Concern  . Not on file   Social History Narrative   2 children Son and daughter 3 biological grandchildren- live in [place]   Larry Davis has 4 children   Married   Works as an Chief Financial Officer in Therapist, nutritional        Past Surgical History  Procedure Laterality Date  . Cholecystectomy  2000  . Shoulder surgery Right 1990    arthroscopy  . Robot assisted laparoscopic radical prostatectomy N/A 06/14/2014    Procedure: ROBOTIC ASSISTED LAPAROSCOPIC RADICAL PROSTATECTOMY WITH INDOCYANINE GREEN DYE INJECTION;  Surgeon: Alexis Frock, MD;  Location: [facility];  Service: Urology;  Laterality: N/A;  . Lymphadenectomy Bilateral 06/14/2014    Procedure: PELVIC LYMPH NODE DISSECTION;  Surgeon: Alexis Frock, MD;  Location: [facility];  Service: Urology;  Laterality: Bilateral;    Family History  Problem Relation Age of Onset  . Heart disease Mother   . Diabetes Mother   . Arthritis Mother   .  Hypertension Mother   . Heart disease Father   . Hypertension Father   . Diabetes Father   . Arthritis Father   . Cancer Maternal Uncle     prostate  . Cancer Maternal Grandmother     cervical    No Known Allergies  Current Outpatient Prescriptions on File Prior to Visit  Medication Sig Dispense Refill  . sitaGLIPtin (JANUVIA) 100 MG tablet Take 1 tablet (100 mg total) by mouth daily. 90 tablet 0  . zolpidem (AMBIEN) 10 MG tablet Take 1 tablet (10 mg total) by mouth at bedtime as needed for sleep. 30 tablet 0   No current facility-administered medications on file prior to visit.    BP 141/82 mmHg  Pulse 68  Temp(Src) 97.9 F (36.6 C) (Oral)  Resp 16  Ht 5\' 8"  (1.727 m)  Wt 209 lb 3.2 oz (94.892 kg)  BMI 31.82 kg/m2  SpO2 98%       Objective:   Physical Exam  Constitutional: He is oriented to person, place, and time. He appears well-developed and well-nourished. No distress.  HENT:  Head: Normocephalic and atraumatic.  Cardiovascular: Normal rate and regular rhythm.   No murmur heard. Pulmonary/Chest: Effort normal and breath sounds normal. No respiratory distress.  He has no wheezes. He has no rales.  Musculoskeletal: He exhibits no edema.  Neurological: He is alert and oriented to person, place, and time.  Skin: Skin is warm and dry.  Psychiatric: He has a normal mood and affect. His behavior is normal. Thought content normal.          Assessment & Plan:  Flu shot today

## 2014-11-13 NOTE — Patient Instructions (Addendum)
Please complete lab work prior to leaving. Follow up in 3 months.  

## 2014-11-13 NOTE — Assessment & Plan Note (Signed)
Obtain follow up A1C, bmet.  Continue [place]. We discussed that if his sugar remains >7 we will need to consider second oral agent. Refer for diabetic eye exam.

## 2014-11-13 NOTE — Progress Notes (Signed)
Pre visit review using our clinic review tool, if applicable. No additional management support is needed unless otherwise documented below in the visit note. 

## 2014-11-13 NOTE — Assessment & Plan Note (Signed)
Stable on prn ambien.  

## 2014-11-18 ENCOUNTER — Telehealth: Payer: Self-pay | Admitting: Family

## 2014-11-19 MED ORDER — CANAGLIFLOZIN 100 MG PO TABS: 100.0000 mg | ORAL_TABLET | Freq: Every day | ORAL | Status: DC

## 2014-11-19 MED ORDER — ATORVASTATIN CALCIUM 20 MG PO TABS: 20.0000 mg | ORAL_TABLET | Freq: Every day | ORAL | Status: DC

## 2014-11-19 NOTE — Telephone Encounter (Signed)
Please contact pt and let him know that sugar is high.  I recommend that he add invokana (another dm med).  Cholesterol is also elevated. I would recommend that he start atorvastatin to lower his cholesterol. Follow up in 3 months.

## 2014-11-19 NOTE — Telephone Encounter (Signed)
Notified pt and scheduled f/u for 02/24/15 at 7am.

## 2014-11-21 ENCOUNTER — Encounter: Payer: Self-pay | Admitting: Family

## 2014-11-21 DIAGNOSIS — E1165 Type 2 diabetes mellitus with hyperglycemia: Principal | ICD-10-CM

## 2014-11-21 DIAGNOSIS — IMO0001 Reserved for inherently not codable concepts without codable children: Secondary | ICD-10-CM

## 2014-11-22 MED ORDER — ASPIRIN 81 MG PO TABS: 81.0000 mg | ORAL_TABLET | Freq: Every day | ORAL | Status: DC

## 2014-11-26 ENCOUNTER — Encounter: Payer: Self-pay | Admitting: Family

## 2014-11-26 MED ORDER — CANAGLIFLOZIN 300 MG PO TABS: 300.0000 mg | ORAL_TABLET | Freq: Every day | ORAL | Status: DC

## 2014-12-21 ENCOUNTER — Encounter: Payer: 59 | Attending: Family

## 2014-12-21 VITALS — Wt 199.3 lb

## 2014-12-21 DIAGNOSIS — Z713 Dietary counseling and surveillance: Secondary | ICD-10-CM | POA: Diagnosis not present

## 2014-12-21 DIAGNOSIS — E119 Type 2 diabetes mellitus without complications: Secondary | ICD-10-CM | POA: Insufficient documentation

## 2014-12-21 NOTE — Progress Notes (Signed)
Patient was seen on 12/21/14 for the complete diabetes self-management series at the [facility]. This is a part of the [facility] [employer].  Handouts given during class include:  Living Well with Diabetes book  Carb Counting and Meal Planning book  Meal Plan Card  Carbohydrate guide  Meal planning worksheet  Low Sodium Flavoring Tips  The diabetes portion plate  Low Carbohydrate Snack Suggestions  A1c to eAG Conversion Chart  Diabetes Medications  Stress Management  Diabetes Recommended Care Schedule  Diabetes Success Plan  Core Class Satisfaction Survey  The following learning objectives were met by the patient during this course:  Describe diabetes  State some common risk factors for diabetes  Defines the role of glucose and insulin  Identifies type of diabetes and pathophysiology  Describe the relationship between diabetes and cardiovascular risk  State the members of the Healthcare Team  States the rationale for glucose monitoring  State when to test glucose  State their individual Target Range  State the importance of logging glucose readings  Describe how to interpret glucose readings  Identifies A1C target  Explain the correlation between A1c and eAG values  State symptoms and treatment of high blood glucose  State symptoms and treatment of low blood glucose  Explain proper technique for glucose testing  Identifies proper sharps disposal  Describe the role of different macronutrients on glucose  Explain how carbohydrates affect blood glucose  State what foods contain the most carbohydrates  Demonstrate carbohydrate counting  Demonstrate how to read Nutrition Facts food label  Describe effects of various fats on heart health  Describe the importance of good nutrition for health and healthy eating strategies  Describe techniques for managing your shopping, cooking and meal planning  List  strategies to follow meal plan when dining out  Describe the effects of alcohol on glucose and how to use it safely . State the amount of activity recommended for healthy living . Describe activities suitable for individual needs . Identify ways to regularly incorporate activity into daily life . Identify barriers to activity and ways to over come these barriers  Identify diabetes medications being personally used and their primary action for lowering glucose and possible side effects . Describe role of stress on blood glucose and develop strategies to address psychosocial issues . Identify diabetes complications and ways to prevent them  Explain how to manage diabetes during illness . Evaluate success in meeting personal goal . Establish 2-3 goals that they will plan to diligently work on until they return for the  48-monthfollow-up visit  Goals:   I will count my carb choices at most meals and snacks  I will be active 30 minutes or more 4 times a week  I will test my glucose at least 2 times a day, 7 days a week  Your patient has identified these potential barriers to change:  Time   Your patient has identified their diabetes self-care support plan as  Family Support On-line Resources   Plan: [place] up with [facility] [place]

## 2015-01-20 ENCOUNTER — Telehealth: Payer: Self-pay | Admitting: Family

## 2015-01-20 MED ORDER — ZOLPIDEM TARTRATE 10 MG PO TABS: 10.0000 mg | ORAL_TABLET | Freq: Every evening | ORAL | Status: DC | PRN

## 2015-01-20 MED ORDER — SITAGLIPTIN PHOSPHATE 100 MG PO TABS: 100.0000 mg | ORAL_TABLET | Freq: Every day | ORAL | Status: DC

## 2015-01-20 NOTE — Telephone Encounter (Signed)
Zolpidem Rx printed and forwarded to PCP for signature.

## 2015-01-21 NOTE — Telephone Encounter (Signed)
Rx faxed to pharmacy. Message sent to pt. 

## 2015-01-27 MED ORDER — ATORVASTATIN CALCIUM 20 MG PO TABS: 20.0000 mg | ORAL_TABLET | Freq: Every day | ORAL | Status: DC

## 2015-01-27 NOTE — Addendum Note (Signed)
Addended by: Kelle Darting A on: 01/27/2015 06:36 PM   Modules accepted: Orders

## 2015-02-03 ENCOUNTER — Encounter: Payer: Self-pay | Admitting: Family

## 2015-02-04 MED ORDER — TRUEPLUS LANCETS 33G MISC: Status: DC

## 2015-02-04 MED ORDER — GLUCOSE BLOOD VI STRP: ORAL_STRIP | Status: DC

## 2015-02-04 MED ORDER — TRUE METRIX METER W/DEVICE KIT: PACK | Status: DC

## 2015-02-17 ENCOUNTER — Encounter: Payer: Self-pay | Admitting: Family

## 2015-02-17 MED FILL — INVOKANA 300 MG TABLET: 300 | 30 days supply | Qty: 30 | Fill #2 | Status: TO

## 2015-02-18 MED ORDER — CANAGLIFLOZIN 300 MG PO TABS: 300.0000 mg | ORAL_TABLET | Freq: Every day | ORAL | Status: DC

## 2015-02-24 ENCOUNTER — Ambulatory Visit: Payer: 59 | Admitting: Family

## 2015-02-25 ENCOUNTER — Ambulatory Visit: Payer: 59 | Admitting: Family

## 2015-02-28 ENCOUNTER — Encounter: Payer: Self-pay | Admitting: Family

## 2015-02-28 NOTE — Telephone Encounter (Signed)
Melissa-- I have scheduled pt for pre-visit follow up labs on 03/11/15.  Please advise what tests you would like him to have?

## 2015-03-10 ENCOUNTER — Encounter: Payer: Self-pay | Admitting: Family

## 2015-03-10 NOTE — Telephone Encounter (Signed)
Larry Davis--  Pt is coming in at 7am tomorrow for labs and we need to put his future orders in.  What labs do you want him to have?  Thanks!

## 2015-03-11 ENCOUNTER — Other Ambulatory Visit (INDEPENDENT_AMBULATORY_CARE_PROVIDER_SITE_OTHER): Payer: 59

## 2015-03-11 DIAGNOSIS — Z0000 Encounter for general adult medical examination without abnormal findings: Secondary | ICD-10-CM | POA: Diagnosis not present

## 2015-03-11 LAB — HEMOGLOBIN A1C: HEMOGLOBIN A1C: 7.3 % — AB (ref 4.6–6.5)

## 2015-03-11 LAB — BASIC METABOLIC PANEL
BUN: 20 mg/dL (ref 6–23)
CALCIUM: 9.3 mg/dL (ref 8.4–10.5)
CO2: 24 mEq/L (ref 19–32)
Chloride: 105 mEq/L (ref 96–112)
Creatinine, Ser: 0.98 mg/dL (ref 0.40–1.50)
GFR: 83.22 mL/min (ref >60.00)
Glucose, Bld: 171 mg/dL — ABNORMAL HIGH (ref 70–99)
Potassium: 4 mEq/L (ref 3.5–5.1)
SODIUM: 140 meq/L (ref 135–145)

## 2015-03-11 LAB — HEPATIC FUNCTION PANEL
ALK PHOS: 60 U/L (ref 39–117)
ALT: 20 U/L (ref 0–53)
AST: 12 U/L (ref 0–37)
Albumin: 4.1 g/dL (ref 3.5–5.2)
BILIRUBIN DIRECT: 0.2 mg/dL (ref 0.0–0.3)
Total Bilirubin: 0.6 mg/dL (ref 0.2–1.2)
Total Protein: 7 g/dL (ref 6.0–8.3)

## 2015-03-18 ENCOUNTER — Ambulatory Visit (INDEPENDENT_AMBULATORY_CARE_PROVIDER_SITE_OTHER): Payer: 59 | Admitting: Family

## 2015-03-18 ENCOUNTER — Encounter: Payer: Self-pay | Admitting: Family

## 2015-03-18 VITALS — BP 125/68 | HR 57 | Temp 98.6°F | Resp 16 | Ht 68.0 in | Wt 193.0 lb

## 2015-03-18 DIAGNOSIS — G4700 Insomnia, unspecified: Secondary | ICD-10-CM | POA: Diagnosis not present

## 2015-03-18 DIAGNOSIS — E118 Type 2 diabetes mellitus with unspecified complications: Secondary | ICD-10-CM | POA: Diagnosis not present

## 2015-03-18 DIAGNOSIS — E785 Hyperlipidemia, unspecified: Secondary | ICD-10-CM | POA: Diagnosis not present

## 2015-03-18 LAB — LIPID PANEL
CHOL/HDL RATIO: 3
Cholesterol: 108 mg/dL (ref 0–200)
HDL: 39.1 mg/dL (ref >39.00)
LDL CALC: 54 mg/dL (ref 0–99)
NonHDL: 68.94
Triglycerides: 76 mg/dL (ref 0.0–149.0)
VLDL: 15.2 mg/dL (ref 0.0–40.0)

## 2015-03-18 MED ORDER — CANAGLIFLOZIN 300 MG PO TABS: 300.0000 mg | ORAL_TABLET | Freq: Every day | ORAL | Status: DC

## 2015-03-18 MED FILL — INVOKANA 300 MG TABLET: 300 | 90 days supply | Qty: 90 | Fill #0

## 2015-03-18 NOTE — Progress Notes (Signed)
Pre visit review using our clinic review tool, if applicable. No additional management support is needed unless otherwise documented below in the visit note. 

## 2015-03-18 NOTE — Assessment & Plan Note (Addendum)
Tolerating statin. Continue same. Obtain lipid panel.

## 2015-03-18 NOTE — Assessment & Plan Note (Signed)
Improving.  Continue current meds.

## 2015-03-18 NOTE — Assessment & Plan Note (Signed)
Stable with rare use of ambien.  

## 2015-03-18 NOTE — Progress Notes (Signed)
Subjective:    Patient ID: Larry Davis, male    DOB: [birth year], 60 y.o.   MRN: [id number]  HPI   [name] is a 60 yr old male who presents today for follow up.  1) DM2- pt is currently maintained on [place] and invokana. Reports eye exam in November- normal per patient.  Wt Readings from Last 3 Encounters:  03/18/15 193 lb (87.544 kg)  12/21/14 199 lb 4.8 oz (90.402 kg)  11/13/14 209 lb 3.2 oz (94.892 kg)   Lab Results  Component Value Date   HGBA1C 7.3* 03/11/2015   HGBA1C 8.7* 11/13/2014   HGBA1C 7.5* 07/31/2014   Lab Results  Component Value Date   MICROALBUR 1.0 11/13/2014   LDLCALC 117* 07/30/2014   CREATININE 0.98 03/11/2015   2) Insomnia- using ambien prn.  Using 2 x a week.    3) Prostate CA- s/p prostatectomy- followed by urology. Saw urology 2 weeks ago.   4) Hyperlipidemia-   Lab Results  Component Value Date   CHOL 192 11/13/2014   HDL 36.50* 11/13/2014   LDLCALC 117* 07/30/2014   LDLDIRECT 142.0 11/13/2014   TRIG 241.0* 11/13/2014   CHOLHDL 5 11/13/2014       Review of Systems  Genitourinary:       + urinary frequency due to prostatectomy   See HPI  Past Medical History  Diagnosis Date  . Diabetes mellitus without complication ([place])   . Prostate cancer ([place]) 03/2014  . Allergy   . PONV (postoperative nausea and vomiting)     Social History   Social History  . Marital Status: Married    Spouse Name: N/A  . Number of Children: N/A  . Years of Education: N/A   Occupational History  . Not on file.   Social History Main Topics  . Smoking status: Never Smoker   . Smokeless tobacco: Never Used  . Alcohol Use: No  . Drug Use: No  . Sexual Activity: Not on file   Other Topics Concern  . Not on file   Social History Narrative   2 children Son and daughter 3 biological grandchildren- live in [place]   Wife has 4 children   Married   Works as an Chief Financial Officer in [employer]        Past Surgical History  Procedure Laterality Date  . Cholecystectomy  2000  . Shoulder surgery Right 1990    arthroscopy  . Robot assisted laparoscopic radical prostatectomy N/A 06/14/2014    Procedure: ROBOTIC ASSISTED LAPAROSCOPIC RADICAL PROSTATECTOMY WITH INDOCYANINE GREEN DYE INJECTION;  Surgeon: Alexis Frock, MD;  Location: [facility];  Service: Urology;  Laterality: N/A;  . Lymphadenectomy Bilateral 06/14/2014    Procedure: PELVIC LYMPH NODE DISSECTION;  Surgeon: Alexis Frock, MD;  Location: [facility];  Service: Urology;  Laterality: Bilateral;    Family History  Problem Relation Age of Onset  . Heart disease Mother   . Diabetes Mother   . Arthritis Mother   . Hypertension Mother   . Heart disease Father   . Hypertension Father   . Diabetes Father   . Arthritis Father   . Cancer Maternal Uncle     prostate  . Cancer Maternal Grandmother     cervical    No Known Allergies  Current Outpatient Prescriptions on File Prior to Visit  Medication Sig Dispense Refill  . aspirin 81 MG tablet Take 1 tablet (81 mg total) by mouth  daily. 30 tablet   . atorvastatin (LIPITOR) 20 MG tablet Take 1 tablet (20 mg total) by mouth daily. 90 tablet 0  . Blood Glucose Monitoring Suppl (TRUE METRIX METER) W/DEVICE KIT Use to check blood sugar DX E11.9 1 kit 0  . canagliflozin (INVOKANA) 300 MG TABS tablet Take 300 mg by mouth daily before breakfast. 90 tablet 0  . glucose blood test strip Use as instructed to check blood sugar once a day.  DX E11.9 100 each 1  . sitaGLIPtin (JANUVIA) 100 MG tablet Take 1 tablet (100 mg total) by mouth daily. 90 tablet 0  . TRUEPLUS LANCETS 33G MISC Use to check blood sugar once a day. DX E11.9 100 each 1  . zolpidem (AMBIEN) 10 MG tablet Take 1 tablet (10 mg total) by mouth at bedtime as needed for sleep. 30 tablet 0   No current facility-administered medications on file prior to visit.    BP 125/68 mmHg  Pulse 57  Temp(Src) 98.6 F (37  C) (Oral)  Resp 16  Ht '5\' 8"'  (1.727 m)  Wt 193 lb (87.544 kg)  BMI 29.35 kg/m2  SpO2 98%       Objective:   Physical Exam  Constitutional: He is oriented to person, place, and time. He appears well-developed and well-nourished. No distress.  HENT:  Head: Normocephalic and atraumatic.  Cardiovascular: Normal rate and regular rhythm.   No murmur heard. Pulmonary/Chest: Effort normal and breath sounds normal. No respiratory distress. He has no wheezes. He has no rales.  Musculoskeletal: He exhibits no edema.  Neurological: He is alert and oriented to person, place, and time.  Skin: Skin is warm and dry.  Psychiatric: He has a normal mood and affect. His behavior is normal. Thought content normal.          Assessment & Plan:

## 2015-03-18 NOTE — Patient Instructions (Signed)
Please complete lab work prior to leaving. Keep up the great work with diet/exercise/weight loss.

## 2015-04-27 ENCOUNTER — Other Ambulatory Visit: Payer: Self-pay | Admitting: Family

## 2015-04-28 ENCOUNTER — Other Ambulatory Visit: Payer: Self-pay | Admitting: Family

## 2015-04-29 MED ORDER — ATORVASTATIN CALCIUM 20 MG PO TABS: 20.0000 mg | ORAL_TABLET | Freq: Every day | ORAL | Status: DC

## 2015-04-29 MED ORDER — SITAGLIPTIN PHOSPHATE 100 MG PO TABS: 100.0000 mg | ORAL_TABLET | Freq: Every day | ORAL | Status: DC

## 2015-04-29 MED FILL — ATORVASTATIN 20 MG TABLET: 20 | 90 days supply | Qty: 90 | Fill #0

## 2015-04-29 MED FILL — JANUVIA 100 MG TABLET: 100 | 90 days supply | Qty: 90 | Fill #0

## 2015-06-12 ENCOUNTER — Telehealth: Payer: Self-pay | Admitting: Family

## 2015-06-13 MED ORDER — ZOLPIDEM TARTRATE 10 MG PO TABS: 10.0000 mg | ORAL_TABLET | Freq: Every evening | ORAL | Status: DC | PRN

## 2015-06-13 MED FILL — INVOKANA 300 MG TABLET: 300 | 90 days supply | Qty: 90 | Fill #1

## 2015-06-13 MED FILL — ZOLPIDEM TARTRATE 10 MG TAB: 10 | 30 days supply | Qty: 30 | Fill #0

## 2015-06-13 NOTE — Telephone Encounter (Signed)
I'd like to see him back in the office please in the next 1 month.

## 2015-06-13 NOTE — Telephone Encounter (Signed)
Rx faxed to pharmacy. Please call pt to schedule appt as below. Thanks!

## 2015-06-13 NOTE — Telephone Encounter (Signed)
Last zolpidem Rx:  01/20/15, #30 UDS:  Moderate risk, next due 08/06/15 Rx printed and forwarded to PCP for signature.  Pt last seen by PCP 03/08/15 and has not future appts scheduled. When should pt f/u in the office again?

## 2015-06-19 NOTE — Telephone Encounter (Signed)
Pt has been scheduled.  °

## 2015-07-24 DIAGNOSIS — C61 Malignant neoplasm of prostate: Secondary | ICD-10-CM | POA: Diagnosis not present

## 2015-07-31 DIAGNOSIS — R32 Unspecified urinary incontinence: Secondary | ICD-10-CM | POA: Diagnosis not present

## 2015-07-31 DIAGNOSIS — C61 Malignant neoplasm of prostate: Secondary | ICD-10-CM | POA: Diagnosis not present

## 2015-08-06 MED FILL — ATORVASTATIN 20 MG TABLET: 20 | 90 days supply | Qty: 90 | Fill #1

## 2015-08-06 MED FILL — JANUVIA 100 MG TABLET: 100 | 90 days supply | Qty: 90 | Fill #1

## 2015-08-11 ENCOUNTER — Encounter: Payer: Self-pay | Admitting: Medical Oncology

## 2015-08-11 ENCOUNTER — Encounter: Payer: Self-pay | Admitting: Radiation Oncology

## 2015-08-11 ENCOUNTER — Ambulatory Visit
Admission: RE | Admit: 2015-08-11 | Discharge: 2015-08-11 | Disposition: A | Discharge status: Home / Self Care | Payer: 59 | Source: Ambulatory Visit | Attending: Radiation Oncology | Admitting: Radiation Oncology

## 2015-08-11 ENCOUNTER — Encounter: Payer: Self-pay | Admitting: Family

## 2015-08-11 ENCOUNTER — Ambulatory Visit (INDEPENDENT_AMBULATORY_CARE_PROVIDER_SITE_OTHER): Payer: 59 | Admitting: Family

## 2015-08-11 VITALS — BP 116/76 | HR 69 | Resp 16 | Ht 68.0 in | Wt 196.1 lb

## 2015-08-11 VITALS — BP 120/78 | HR 61 | Temp 98.4°F | Resp 16 | Ht 68.0 in | Wt 196.4 lb

## 2015-08-11 DIAGNOSIS — E1165 Type 2 diabetes mellitus with hyperglycemia: Secondary | ICD-10-CM

## 2015-08-11 DIAGNOSIS — E119 Type 2 diabetes mellitus without complications: Secondary | ICD-10-CM | POA: Insufficient documentation

## 2015-08-11 DIAGNOSIS — E785 Hyperlipidemia, unspecified: Secondary | ICD-10-CM

## 2015-08-11 DIAGNOSIS — C61 Malignant neoplasm of prostate: Secondary | ICD-10-CM | POA: Insufficient documentation

## 2015-08-11 DIAGNOSIS — Z9079 Acquired absence of other genital organ(s): Secondary | ICD-10-CM | POA: Diagnosis not present

## 2015-08-11 DIAGNOSIS — G4700 Insomnia, unspecified: Secondary | ICD-10-CM

## 2015-08-11 DIAGNOSIS — M25511 Pain in right shoulder: Secondary | ICD-10-CM

## 2015-08-11 DIAGNOSIS — Z7982 Long term (current) use of aspirin: Secondary | ICD-10-CM | POA: Insufficient documentation

## 2015-08-11 DIAGNOSIS — Z79899 Other long term (current) drug therapy: Secondary | ICD-10-CM | POA: Insufficient documentation

## 2015-08-11 DIAGNOSIS — E118 Type 2 diabetes mellitus with unspecified complications: Secondary | ICD-10-CM

## 2015-08-11 DIAGNOSIS — IMO0001 Reserved for inherently not codable concepts without codable children: Secondary | ICD-10-CM

## 2015-08-11 LAB — MICROALBUMIN / CREATININE URINE RATIO
CREATININE, U: 59.2 mg/dL
Microalb Creat Ratio: 1.2 mg/g (ref 0.0–30.0)

## 2015-08-11 LAB — BASIC METABOLIC PANEL
BUN: 18 mg/dL (ref 6–23)
CHLORIDE: 103 meq/L (ref 96–112)
CO2: 25 meq/L (ref 19–32)
Calcium: 9.6 mg/dL (ref 8.4–10.5)
Creatinine, Ser: 1.02 mg/dL (ref 0.40–1.50)
GFR: 79.36 mL/min (ref >60.00)
Glucose, Bld: 191 mg/dL — ABNORMAL HIGH (ref 70–99)
POTASSIUM: 3.8 meq/L (ref 3.5–5.1)
SODIUM: 137 meq/L (ref 135–145)

## 2015-08-11 LAB — HEMOGLOBIN A1C: Hgb A1c MFr Bld: 7.3 % — ABNORMAL HIGH (ref 4.6–6.5)

## 2015-08-11 MED FILL — INVOKANA 300 MG TABLET: 300 | 90 days supply | Qty: 90 | Fill #0

## 2015-08-11 NOTE — Progress Notes (Signed)
Subjective:    Patient ID: Larry Davis, male    DOB: 04-07-1955, 60 y.o.   MRN: [id number]  HPI   Larry Davis is a 60 yr old male who presents today for follow up.  1) Hyperlipidemia- maintained on lipitor.   Lab Results  Component Value Date   CHOL 108 03/18/2015   HDL 39.10 03/18/2015   LDLCALC 54 03/18/2015   LDLDIRECT 142.0 11/13/2014   TRIG 76.0 03/18/2015   CHOLHDL 3 03/18/2015   2) DM2- on invokana and januvia.  Runs about 160-170.  Lab Results  Component Value Date   HGBA1C 7.3* 03/11/2015   HGBA1C 8.7* 11/13/2014   HGBA1C 7.5* 07/31/2014   Lab Results  Component Value Date   MICROALBUR 1.0 11/13/2014   LDLCALC 54 03/18/2015   CREATININE 0.98 03/11/2015   3) Prostate CA- s/ laparoscopic radical prostatectomy.  Following with Dr. Tresa Moore, has rising PSA and is being evaluated for salvage radiation therapy.   4) R shoulder pain- "for years." has had "cortisone shots" in the past that have helped.      Review of Systems  Musculoskeletal:       Some cramping in legs/feet- occasional.  Denies myalgia       Past Medical History  Diagnosis Date  . Diabetes mellitus without complication ([place])   . Prostate cancer ([place]) 03/2014  . Allergy   . PONV (postoperative nausea and vomiting)      Social History   Social History  . Marital Status: Married    Spouse Name: N/A  . Number of Children: N/A  . Years of Education: N/A   Occupational History  . Not on file.   Social History Main Topics  . Smoking status: Never Smoker   . Smokeless tobacco: Never Used  . Alcohol Use: No  . Drug Use: No  . Sexual Activity: Not on file   Other Topics Concern  . 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 Cholecystectomy  2000  . Shoulder surgery Right 1990    arthroscopy  . Robot assisted laparoscopic radical prostatectomy N/A 06/14/2014    Procedure: ROBOTIC ASSISTED LAPAROSCOPIC RADICAL PROSTATECTOMY WITH INDOCYANINE GREEN DYE INJECTION;  Surgeon: Alexis Frock, MD;  Location: [facility];  Service: Urology;  Laterality: N/A;  . Lymphadenectomy Bilateral 06/14/2014    Procedure: PELVIC LYMPH NODE DISSECTION;  Surgeon: Alexis Frock, MD;  Location: [facility];  Service: Urology;  Laterality: Bilateral;    Family History  Problem Relation Age of Onset  . Heart disease Mother   . Diabetes Mother   . Arthritis Mother   . Hypertension Mother   . Heart disease Father   . Hypertension Father   . Diabetes Father   . Arthritis Father   . Cancer Maternal Uncle     prostate  . Cancer Maternal Grandmother     cervical    No Known Allergies  Current Outpatient Prescriptions on File Prior to Visit  Medication Sig Dispense Refill  . aspirin 81 MG tablet Take 1 tablet (81 mg total) by mouth daily. 30 tablet   . atorvastatin (LIPITOR) 20 MG tablet Take 1 tablet (20 mg total)  by mouth daily. 90 tablet 1  . Blood Glucose Monitoring Suppl (TRUE METRIX METER) W/DEVICE KIT Use to check blood sugar DX E11.9 1 kit 0  . canagliflozin (INVOKANA) 300 MG TABS tablet Take 1 tablet (300 mg total) by mouth daily before breakfast. 90 tablet 1  . glucose blood test strip Use as instructed to check blood sugar once a day.  DX E11.9 100 each 1  . sitaGLIPtin (JANUVIA) 100 MG tablet Take 1 tablet (100 mg total) by mouth daily. 90 tablet 1  . TRUEPLUS LANCETS 33G MISC Use to check blood sugar once a day. DX E11.9 100 each 1  . zolpidem (AMBIEN) 10 MG tablet Take 1 tablet (10 mg total) by mouth at bedtime as needed for sleep. 30 tablet 0   No current facility-administered medications on file prior to visit.    BP 120/78 mmHg  Pulse 61  Temp(Src) 98.4 F (36.9 C) (Oral)  Resp 16  Ht _0  (1.727 m)  Wt 196 lb  6.4 oz (89.086 kg)  BMI 29.87 kg/m2  SpO2 100%    Objective:   Physical Exam  Constitutional: He is oriented to person, place, and time. He appears well-developed and well-nourished. No distress.  HENT:  Head: Normocephalic and atraumatic.  Cardiovascular: Normal rate and regular rhythm.   No murmur heard. Pulmonary/Chest: Effort normal and breath sounds normal. No respiratory distress. He has no wheezes. He has no rales.  Musculoskeletal: He exhibits no edema.  Neurological: He is alert and oriented to person, place, and time.  Skin: Skin is warm and dry.  Psychiatric: He has a normal mood and affect. His behavior is normal. Thought content normal.          Assessment & Plan:  R shoulder pain- refer to sports medicine for further evaluation.

## 2015-08-11 NOTE — Progress Notes (Signed)
  Radiation Oncology         [phone] ________________________________  Name: Larry Davis MRN: [id number]  Date: 08/11/2015  DOB: [birth year]  SIMULATION AND TREATMENT PLANNING NOTE    ICD-9-CM ICD-10-CM   1. Prostate cancer ([place]) 185 C61     DIAGNOSIS: 60 y.o. 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  Identity was confirmed.  All relevant records and images related to the planned course of therapy were reviewed.  The patient freely provided informed written consent to proceed with treatment after reviewing the details related to the planned course of therapy. The consent form was witnessed and verified by the simulation staff.  Then, the patient was set-up in a stable reproducible supine position for radiation therapy.  A vacuum lock pillow device was custom fabricated to position his legs in a reproducible immobilized position.  Then, I performed a urethrogram under sterile conditions to identify the prostatic apex.  CT images were obtained.  Surface markings were placed.  The CT images were loaded into the planning software.  Then the prostate target and avoidance structures including the rectum, bladder, bowel and hips were contoured.  Treatment planning then occurred.  The radiation prescription was entered and confirmed.  A total of one complex treatment device was fabricated. I have requested : Intensity Modulated Radiotherapy (IMRT) is medically necessary for this case for the following reason:  Rectal sparing.Marland Kitchen  PLAN:  The patient will receive 68.4 Gy in 38 fractions.  ________________________________  Sheral Apley Tammi Klippel, M.D.  This document serves as a record of services personally performed by Tyler Pita, MD. It was created on his behalf by Darcus Austin, a  trained medical scribe. The creation of this record is based on the scribe's personal observations and the provider's statements to them. This document has been checked and approved by the attending provider.

## 2015-08-11 NOTE — Progress Notes (Signed)
See progress note under physician encounter. 

## 2015-08-11 NOTE — Progress Notes (Signed)
Radiation Oncology         [phone] ________________________________  Initial Outpatient Consultation  Name: Larry Davis MRN: [id number]  Date: 08/11/2015  DOB: 1956/01/24  CC:O'SULLIVAN,MELISSA S., NP  Alexis Frock, MD   REFERRING PHYSICIAN: Alexis Frock, MD  DIAGNOSIS: 60 y.o. 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 Malignant neoplasm of prostate ([place]) 185 C61   2. Prostate cancer ([place]) Middleton is a 60 y.o. gentleman.  He was noted to have an elevated PSA of 7.09 by his primary care physician, Dr. Orland Mustard.  Accordingly, he was referred for evaluation in urology by Dr. Janice Norrie. The patient proceeded to transrectal ultrasound with 12 biopsies of the prostate on 03/21/14. Out of 12 core biopsies, 3 were positive.  The maximum Gleason score was 3+4, and this was seen in the right lateral mid and right mid. The right lateral base was Gleason score 3+3.  The patient underwent a radical prostatectomy and lymph node biopsies on 06/14/14, by Dr. Tresa Moore. This revealed prostatic adenocarcinoma, Gleason's Score 4+3=7, involving both lobes (broadly extending to the inked cauterized left apical margin, measuring 0.8 cm in linear length from the glass slide) with no evidence of angiolymphatic invasion, extraprostatic extension or seminal vesicle involvement. Biopsy of the right distal ureteral margin, fatty tissue, left external iliac sentinel lymph node, right external iliac sentinel lymph node, left obturator sentinel lymph node, right obturator sentinel lymph node, and three lymph nodes were negative for metastasis.  The patient's PSA was followed post-operatively and was noted to increase in June  2017.  7/2016PSA0.01 9/2016PSA0.03 12/2016PSA0.03 6/2017PSA0.14  The patient has kindly been referred today for discussion of salvage radiation treatment options.  PREVIOUS RADIATION THERAPY: No  PAST MEDICAL HISTORY:  has a past medical history of Diabetes mellitus without complication ([place]); Prostate cancer ([place]) (03/2014); Allergy; and PONV (postoperative nausea and vomiting).    PAST SURGICAL HISTORY: Past Surgical History  Procedure Laterality Date  . Cholecystectomy  2000  . Shoulder surgery Right 1990    arthroscopy  . Robot assisted laparoscopic radical prostatectomy N/A 06/14/2014    Procedure: ROBOTIC ASSISTED LAPAROSCOPIC RADICAL PROSTATECTOMY WITH INDOCYANINE GREEN DYE INJECTION;  Surgeon: Alexis Frock, MD;  Location: [facility];  Service: Urology;  Laterality: N/A;  . Lymphadenectomy Bilateral 06/14/2014    Procedure: PELVIC LYMPH NODE DISSECTION;  Surgeon: Alexis Frock, MD;  Location: [facility];  Service: Urology;  Laterality: Bilateral;    FAMILY HISTORY: family history includes Arthritis in his father and mother; Cancer in his maternal grandmother and maternal uncle; Diabetes in his father and mother; Heart disease in his father and mother; Hypertension in his father and mother.  SOCIAL HISTORY:  reports that he has never smoked. He has never used smokeless tobacco. He reports that he does not drink alcohol or use illicit drugs.  ALLERGIES: Review of patient's allergies indicates no known allergies.  MEDICATIONS:  Current Outpatient Prescriptions  Medication Sig Dispense Refill  . aspirin 81 MG tablet Take 1 tablet (81 mg total) by mouth daily. 30 tablet   . atorvastatin (LIPITOR) 20 MG tablet Take 1 tablet (20 mg total) by mouth daily. 90 tablet 1  . Blood Glucose Monitoring Suppl (TRUE METRIX METER) W/DEVICE KIT Use to check blood sugar DX E11.9 1 kit  0   . canagliflozin (INVOKANA) 300 MG TABS tablet Take 1 tablet (300 mg total) by mouth daily before breakfast. 90 tablet 1  . glucose blood test strip Use as instructed to check blood sugar once a day.  DX E11.9 100 each 1  . sitaGLIPtin (JANUVIA) 100 MG tablet Take 1 tablet (100 mg total) by mouth daily. 90 tablet 1  . TRUEPLUS LANCETS 33G MISC Use to check blood sugar once a day. DX E11.9 100 each 1  . zolpidem (AMBIEN) 10 MG tablet Take 1 tablet (10 mg total) by mouth at bedtime as needed for sleep. 30 tablet 0   No current facility-administered medications for this encounter.    REVIEW OF SYSTEMS:  A 15 point review of systems is documented in the electronic medical record. This was obtained by the nursing staff. However, I reviewed this with the patient to discuss relevant findings and make appropriate changes.  Pertinent items noted in HPI and remainder of comprehensive ROS otherwise negative..  The patient completed an IPSS and IIEF questionnaire.    PHYSICAL EXAM: This patient is in no acute distress.  He is alert and oriented.   height is '5\' 8"'  (1.727 m) and weight is 196 lb 1.6 oz (88.95 kg). His blood pressure is 116/76 and his pulse is 69. His respiration is 16 and oxygen saturation is 100%.  He exhibits no respiratory distress or labored breathing.  He appears neurologically intact.  His mood is pleasant.  His affect is appropriate.  Please note the digital rectal exam findings described above.  KPS = 90  100 - Normal; no complaints; no evidence of disease. 90   - Able to carry on normal activity; minor signs or symptoms of disease. 80   - Normal activity with effort; some signs or symptoms of disease. 10   - Cares for self; unable to carry on normal activity or to do active work. 60   - Requires occasional assistance, but is able to care for most of his personal needs. 50   - Requires considerable assistance and frequent medical care. 61   - Disabled; requires special care and  assistance. 55   - Severely disabled; hospital admission is indicated although death not imminent. 28   - Very sick; hospital admission necessary; active supportive treatment necessary. 10   - Moribund; fatal processes progressing rapidly. 0     - Dead  Karnofsky DA, Abelmann [place], Craver LS and Burchenal [facility] [phone]) The use of the nitrogen mustards in the palliative treatment of carcinoma: with particular reference to bronchogenic carcinoma Cancer 1 [id number]   LABORATORY DATA:  Lab Results  Component Value Date   WBC 8.0 07/30/2014   HGB 15.1 07/30/2014   HCT 44.0 07/30/2014   MCV 87.9 07/30/2014   PLT 204.0 07/30/2014   Lab Results  Component Value Date   NA 137 08/11/2015   K 3.8 08/11/2015   CL 103 08/11/2015   CO2 25 08/11/2015   Lab Results  Component Value Date   ALT 20 03/11/2015   AST 12 03/11/2015   ALKPHOS 60 03/11/2015   BILITOT 0.6 03/11/2015     RADIOGRAPHY: No results found.    IMPRESSION: This gentleman is a 60 y.o. 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  He has a biochemical failure with positive surgical margin and may benefit from salvage  radiation treatment.  PLAN: Today I reviewed the findings and workup thus far.  We discussed the natural history of prostate cancer.  We reviewed the the implications of T-stage, Gleason's Score, and PSA on decision-making and outcomes in prostate cancer.  We discussed radiation treatment in the management of prostate cancer with regard to the logistics and delivery of external beam radiation treatment.  The patient would like to proceed with prostate salvage IMRT.  I will share my findings with Dr. Tresa Moore.  I enjoyed meeting with him today, and will look forward to participating in the care of this very nice gentleman.  CT Simulation has been scheduled today at 1PM. The patient  signed a consent form and this was placed in his medical chart.  I spent time face to face with the patient and more than 50% of that time was spent in counseling and/or coordination of care.   ------------------------------------------------  Sheral Apley. Tammi Klippel, M.D.  This document serves as a record of services personally performed by Tyler Pita, MD. It was created on his behalf by Darcus Austin, a trained medical scribe. The creation of this record is based on the scribe's personal observations and the provider's statements to them. This document has been checked and approved by the attending provider.

## 2015-08-11 NOTE — Patient Instructions (Addendum)
Please complete lab work prior to leaving. You will be contacted about your referral to sports medicine for your shoulder pain.

## 2015-08-11 NOTE — Assessment & Plan Note (Signed)
Management per urology 

## 2015-08-11 NOTE — Assessment & Plan Note (Addendum)
Clinically stable on current medications, continue same.  Obtain A1C, bmet, urine microalbumin.

## 2015-08-11 NOTE — Assessment & Plan Note (Signed)
Continues prn Larry Davis, will obtain uds today.

## 2015-08-11 NOTE — Assessment & Plan Note (Signed)
LDL at goal on statin, continue same.

## 2015-08-11 NOTE — Progress Notes (Signed)
Oncology Nurse Navigator Documentation  Oncology Nurse Navigator Flowsheets 08/11/2015  Navigator Location [facility]  Navigator Encounter Type Initial RadOnc  Abnormal Finding Date 07/24/2015  Surgery Date 06/14/2014  Treatment Initiated Date 08/11/2015  Treatment Phase CT SIM-   Barriers/Navigation Needs No barriers at this time  Interventions None required  Support Groups/Services Friends and Family  Acuity Level 1  Acuity Level 1 Initial guidance, education and coordination as needed  Time Spent with Patient 30

## 2015-08-11 NOTE — Progress Notes (Signed)
GU Location of Tumor / Histology: biochemical recurrent prostate cancer  If Prostate Cancer, Gleason Score is (4 + 3) and PSA is (7.09) pre treatment.  08/2014  PSA  0.01 10/2014  PSA  0.03 01/2015 PSA  0.03 08/2015  PSA  0.14  1. Ureter, biopsy, right distal ureteral margin - NO EVIDENCE OF CARCINOMA 2. Fatty tissue - NO EVIDENCE OF CARCINOMA. 3. Lymph node, biopsy, left external iliac sentinel - ONE LYMPH NODE, NEGATIVE FOR METASTATIC CARCINOMA (0/1). 4. Lymph node, biopsy, left obturator sentinel - ONE LYMPH NODE, NEGATIVE FOR METASTATIC CARCINOMA (0/1). 5. Lymph node, biopsy, right obturator sentinel - ONE LYMPH NODE, NEGATIVE FOR METASTATIC CARCINOMA (0/1). 6. Lymph nodes, regional resection, right external iliac sentinel - THREE LYMPH NODES, NEGATIVE FOR METASTATIC CARCINOMA (0/3). 7. Prostate, radical resection - PROSTATIC ADENOCARCINOMA, GLEASON'S SCORE 4+3=7, PROGNOSTIC GRADE GROUP III, INVOLVING BOTH LOBES, BROADLY EXTENDING TO THE INKED CAUTERIZED LEFT APICAL MARGIN. - NO EVIDENCE OF ANGIOLYMPHATIC INVASION, EXTRAPROSTATIC EXTENSION OR SEMINAL VESICLE INVOLVEMENT IDENTIFIED. Microscopic Comment 1. Multiple deeper levels were obtained and yield no additional findings. 7. PROSTATE RADICAL RESECTION/PROSTATECTOMY Procedure: Prostatectomy.  Past/Anticipated interventions by urology, if any: prostatectomy, post op surveillance, referral to Dr. Tammi Klippel for consideration of salvage XRT  Past/Anticipated interventions by medical oncology, if any: no  Weight changes, if any: no  Bowel/Bladder complaints, if any: Nocturia x 2. Urgency less than half the time. Incomplete emptying less than 1 in 5 times. Denies dysuria or hematuria. Reports incontinence has resolved. IPSS 5.    Nausea/Vomiting, if any: no  Pain issues, if any:  no  SAFETY ISSUES:  Prior radiation? no  Pacemaker/ICD? no  Possible current pregnancy? no  Is the patient on methotrexate? 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

## 2015-08-13 ENCOUNTER — Telehealth: Payer: Self-pay | Admitting: Family

## 2015-08-13 MED ORDER — METFORMIN HCL 500 MG PO TABS: 500.0000 mg | ORAL_TABLET | Freq: Two times a day (BID) | ORAL | Status: DC

## 2015-08-13 NOTE — Telephone Encounter (Signed)
Sugar is slightly above goal. Begin metformin 500mg  bid.

## 2015-08-14 ENCOUNTER — Ambulatory Visit (INDEPENDENT_AMBULATORY_CARE_PROVIDER_SITE_OTHER): Payer: 59 | Admitting: Family Medicine

## 2015-08-14 ENCOUNTER — Encounter: Payer: Self-pay | Admitting: Family Medicine

## 2015-08-14 ENCOUNTER — Ambulatory Visit: Payer: 59 | Admitting: Family Medicine

## 2015-08-14 VITALS — BP 121/75 | HR 69 | Ht 68.0 in | Wt 192.0 lb

## 2015-08-14 DIAGNOSIS — M25511 Pain in right shoulder: Secondary | ICD-10-CM

## 2015-08-14 MED ORDER — METHYLPREDNISOLONE ACETATE 40 MG/ML IJ SUSP
40.0000 mg | Freq: Once | INTRAMUSCULAR | Status: AC
  Administered 2015-08-14: 40 mg via INTRA_ARTICULAR

## 2015-08-14 NOTE — Patient Instructions (Signed)
You have rotator cuff impingement Try to avoid painful activities (overhead activities, lifting with extended arm) as much as possible. Aleve 2 tabs twice a day with food OR ibuprofen 3 tabs three times a day with food for pain and inflammation if needed. Can take tylenol in addition to this. Subacromial injection may be beneficial to help with pain and to decrease inflammation - you were given this today. Consider physical therapy with transition to home exercise program. Do home exercise program with theraband and scapular stabilization exercises daily - these are very important for long term relief even if an injection was given.  3 sets of 10 once a day. If not improving at follow-up we will consider further imaging, physical therapy, and/or nitro patches. Follow up with me in 6 weeks or as needed.

## 2015-08-14 NOTE — Telephone Encounter (Signed)
Pt aware.

## 2015-08-14 NOTE — Telephone Encounter (Signed)
No further recommendations.

## 2015-08-14 NOTE — Telephone Encounter (Signed)
Notified pt. He states that he took Metformin in the past for 3 years and it caused him to have diarrhea the whole time and he is not going to take the medication again. Pt states he can work on his diet and exercise a little more. Rx cancelled by Ailene Ravel at [facility].   Please advise if any other recommendations.

## 2015-08-15 DIAGNOSIS — M25519 Pain in unspecified shoulder: Secondary | ICD-10-CM | POA: Insufficient documentation

## 2015-08-15 NOTE — Progress Notes (Signed)
PCP and consultation requested by: Nance Pear., NP  Subjective:   HPI: Patient is a 60 y.o. male here for right shoulder pain.  Patient reports he's had over 20 years of problems with right shoulder. Includes surgical intervention that sounds like debridement but no records available. Pain has been daily, worse with reaching and overhead movements. Pain level 3/10, sharp with these deep lateral shoulder. Has intermittently had injections which usually help for a couple years. + night pain. No radiation. No numbness or tingling.  Past Medical History  Diagnosis Date  . Diabetes mellitus without complication ([place])   . Prostate cancer ([place]) 03/2014  . Allergy   . PONV (postoperative nausea and vomiting)     Current Outpatient Prescriptions on File Prior to Visit  Medication Sig Dispense Refill  . aspirin 81 MG tablet Take 1 tablet (81 mg total) by mouth daily. 30 tablet   . atorvastatin (LIPITOR) 20 MG tablet Take 1 tablet (20 mg total) by mouth daily. 90 tablet 1  . Blood Glucose Monitoring Suppl (TRUE METRIX METER) W/DEVICE KIT Use to check blood sugar DX E11.9 1 kit 0  . canagliflozin (INVOKANA) 300 MG TABS tablet Take 1 tablet (300 mg total) by mouth daily before breakfast. 90 tablet 1  . glucose blood test strip Use as instructed to check blood sugar once a day.  DX E11.9 100 each 1  . sitaGLIPtin (JANUVIA) 100 MG tablet Take 1 tablet (100 mg total) by mouth daily. 90 tablet 1  . TRUEPLUS LANCETS 33G MISC Use to check blood sugar once a day. DX E11.9 100 each 1  . zolpidem (AMBIEN) 10 MG tablet Take 1 tablet (10 mg total) by mouth at bedtime as needed for sleep. 30 tablet 0   No current facility-administered medications on file prior to visit.    Past Surgical History  Procedure Laterality Date  . Cholecystectomy  2000  . Shoulder surgery Right 1990    arthroscopy  . Robot assisted laparoscopic radical prostatectomy N/A 06/14/2014    Procedure: ROBOTIC ASSISTED  LAPAROSCOPIC RADICAL PROSTATECTOMY WITH INDOCYANINE GREEN DYE INJECTION;  Surgeon: Alexis Frock, MD;  Location: [facility];  Service: Urology;  Laterality: N/A;  . Lymphadenectomy Bilateral 06/14/2014    Procedure: PELVIC LYMPH NODE DISSECTION;  Surgeon: Alexis Frock, MD;  Location: [facility];  Service: Urology;  Laterality: Bilateral;    Allergies  Allergen Reactions  . Metformin And Related     diarrhea    Social History   Social History  . Marital Status: Married    Spouse Name: N/A  . Number of Children: N/A  . Years of Education: N/A   Occupational History  . Not on file.   Social History Main Topics  . Smoking status: Never Smoker   . Smokeless tobacco: Never Used  . Alcohol Use: No  . Drug Use: No  . Sexual Activity: Not on file   Other Topics Concern  . 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 Heart disease Mother   . Diabetes Mother   . Arthritis Mother   . Hypertension Mother   . Heart disease Father   . Hypertension Father   .  Diabetes Father   . Arthritis Father   . Cancer Maternal Uncle     prostate  . Cancer Maternal Grandmother     cervical    BP 121/75 mmHg  Pulse 69  Ht _0  (1.727 m)  Wt 192 lb (87.091 kg)  BMI 29.20 kg/m2  Review of Systems: See HPI above.    Objective:  Physical Exam:  Gen: NAD, comfortable in exam room  Right shoulder: No swelling, ecchymoses.  No gross deformity. No TTP. FROM with painful arc. Negative Hawkins, negative Neers. Negative Speeds, Yergasons. Strength 5/5 with empty can and resisted internal/external rotation. Pain empty can > IR. Negative apprehension. NV intact distally.  Left shoulder: FROM without pain.    Assessment & Plan:  1.  Right shoulder pain - 2/2 rotator cuff impingement.  Discussed nsaids, tylenol.  Shown home exercises to do daily.  Subacromial injection given today.  Consider nitro patches, imaging, PT if not improving.  F/u in 6 weeks or prn.  After informed written consent, patient was seated on exam table. Right shoulder was prepped with alcohol swab and utilizing posterior approach, patient's right subacromial space was injected with 3:1 marcaine: depomedrol. Patient tolerated the procedure well without immediate complications.

## 2015-08-15 NOTE — Assessment & Plan Note (Addendum)
2/2 rotator cuff impingement.  Discussed nsaids, tylenol.  Shown home exercises to do daily.  Subacromial injection given today.  Consider nitro patches, imaging, PT if not improving.  F/u in 6 weeks or prn.  After informed written consent, patient was seated on exam table. Right shoulder was prepped with alcohol swab and utilizing posterior approach, patient's right subacromial space was injected with 3:1 marcaine: depomedrol. Patient tolerated the procedure well without immediate complications.

## 2015-08-21 ENCOUNTER — Ambulatory Visit: Payer: 59 | Admitting: Radiation Oncology

## 2015-08-22 ENCOUNTER — Ambulatory Visit: Payer: 59

## 2015-08-25 ENCOUNTER — Ambulatory Visit: Payer: 59

## 2015-08-25 DIAGNOSIS — C61 Malignant neoplasm of prostate: Secondary | ICD-10-CM | POA: Diagnosis not present

## 2015-08-25 DIAGNOSIS — Z7982 Long term (current) use of aspirin: Secondary | ICD-10-CM | POA: Diagnosis not present

## 2015-08-25 DIAGNOSIS — Z9079 Acquired absence of other genital organ(s): Secondary | ICD-10-CM | POA: Diagnosis not present

## 2015-08-25 DIAGNOSIS — Z79899 Other long term (current) drug therapy: Secondary | ICD-10-CM | POA: Diagnosis not present

## 2015-08-25 DIAGNOSIS — E119 Type 2 diabetes mellitus without complications: Secondary | ICD-10-CM | POA: Diagnosis not present

## 2015-08-26 ENCOUNTER — Ambulatory Visit: Payer: 59

## 2015-08-27 ENCOUNTER — Ambulatory Visit: Payer: 59

## 2015-08-28 ENCOUNTER — Ambulatory Visit: Payer: 59

## 2015-08-28 DIAGNOSIS — Z9079 Acquired absence of other genital organ(s): Secondary | ICD-10-CM | POA: Diagnosis not present

## 2015-08-28 DIAGNOSIS — E119 Type 2 diabetes mellitus without complications: Secondary | ICD-10-CM | POA: Diagnosis not present

## 2015-08-28 DIAGNOSIS — C61 Malignant neoplasm of prostate: Secondary | ICD-10-CM | POA: Diagnosis not present

## 2015-08-28 DIAGNOSIS — Z7982 Long term (current) use of aspirin: Secondary | ICD-10-CM | POA: Diagnosis not present

## 2015-08-28 DIAGNOSIS — Z79899 Other long term (current) drug therapy: Secondary | ICD-10-CM | POA: Diagnosis not present

## 2015-08-29 ENCOUNTER — Ambulatory Visit: Payer: 59

## 2015-09-01 ENCOUNTER — Ambulatory Visit: Payer: 59

## 2015-09-02 ENCOUNTER — Ambulatory Visit: Payer: 59

## 2015-09-03 ENCOUNTER — Ambulatory Visit: Payer: 59

## 2015-09-04 ENCOUNTER — Ambulatory Visit: Payer: 59

## 2015-09-05 ENCOUNTER — Ambulatory Visit: Payer: 59

## 2015-09-08 ENCOUNTER — Ambulatory Visit: Payer: 59

## 2015-09-09 ENCOUNTER — Ambulatory Visit: Payer: 59

## 2015-09-10 ENCOUNTER — Ambulatory Visit: Payer: 59

## 2015-09-10 ENCOUNTER — Telehealth: Payer: Self-pay | Admitting: Medical Oncology

## 2015-09-10 NOTE — Telephone Encounter (Signed)
[name] called asking about insurance payment for husband's radiation treatments. I explained that the business office in radiation oncology gets approval. I gave her [name]'s number so she can call and find out what their out of pocket cost will be. I asked her to call me back with any questions or concerns. She voiced understanding. Larry Davis will start radiation August 1.

## 2015-09-11 ENCOUNTER — Ambulatory Visit: Payer: 59

## 2015-09-12 ENCOUNTER — Ambulatory Visit: Payer: 59

## 2015-09-15 ENCOUNTER — Telehealth: Payer: Self-pay | Admitting: Medical Oncology

## 2015-09-15 ENCOUNTER — Ambulatory Visit: Payer: 59

## 2015-09-16 ENCOUNTER — Ambulatory Visit
Admission: RE | Admit: 2015-09-16 | Discharge: 2015-09-16 | Disposition: A | Discharge status: Home / Self Care | Payer: 59 | Source: Ambulatory Visit | Attending: Radiation Oncology | Admitting: Radiation Oncology

## 2015-09-16 ENCOUNTER — Ambulatory Visit: Payer: 59

## 2015-09-16 ENCOUNTER — Encounter: Payer: Self-pay | Admitting: Medical Oncology

## 2015-09-16 DIAGNOSIS — E119 Type 2 diabetes mellitus without complications: Secondary | ICD-10-CM | POA: Diagnosis not present

## 2015-09-16 DIAGNOSIS — Z79899 Other long term (current) drug therapy: Secondary | ICD-10-CM | POA: Diagnosis not present

## 2015-09-16 DIAGNOSIS — Z7982 Long term (current) use of aspirin: Secondary | ICD-10-CM | POA: Diagnosis not present

## 2015-09-16 DIAGNOSIS — Z9079 Acquired absence of other genital organ(s): Secondary | ICD-10-CM | POA: Diagnosis not present

## 2015-09-16 DIAGNOSIS — C61 Malignant neoplasm of prostate: Secondary | ICD-10-CM | POA: Diagnosis not present

## 2015-09-16 NOTE — Telephone Encounter (Signed)
Vaughan Basta- wife called stating she has not received a call regarding the cost/insurane coverage of radiation treatments. I will contact Ailene Ravel in radiation business office and have her contact Vaughan Basta. She voiced understanding.

## 2015-09-16 NOTE — Progress Notes (Signed)
Oncology Nurse Navigator Documentation  Oncology Nurse Navigator Flowsheets 09/10/2015 09/15/2015 09/16/2015  Navigator Location - - -  Navigator Encounter Type Telephone Telephone -  Telephone Incoming Call;Financial Assistance Incoming Call -  Abnormal Finding Date - - -  Surgery Date - - -  Treatment Initiated Date - - -  Patient Visit Type - - RadOnc  Treatment Phase - - First Radiation Tx  Barriers/Navigation Needs - Financial;Family concerns Education- Discussed having a comfortable full bladder to help control side effects. Informed patient and his wife that he will see Dr. Tammi Klippel weekly during treatment.  Education - - Concerns with Finances/ Eligibility- [name] states she was shock at how much of the bill they are responsible for. I informed her that she can talk further with Ailene Ravel and I am sure that we can work with them to set up payments if needed. She voiced understanding.  Interventions - Other Education Method  Education Method - - Teach-back;Verbal  Support Groups/Services - - Friends and Family  Acuity - - -  Acuity Level 1 - - -  Time Spent with Patient [id number]

## 2015-09-17 ENCOUNTER — Ambulatory Visit
Admission: RE | Admit: 2015-09-17 | Discharge: 2015-09-17 | Disposition: A | Discharge status: Home / Self Care | Payer: 59 | Source: Ambulatory Visit | Attending: Radiation Oncology | Admitting: Radiation Oncology

## 2015-09-17 ENCOUNTER — Ambulatory Visit: Payer: 59

## 2015-09-17 DIAGNOSIS — E119 Type 2 diabetes mellitus without complications: Secondary | ICD-10-CM | POA: Diagnosis not present

## 2015-09-17 DIAGNOSIS — Z7982 Long term (current) use of aspirin: Secondary | ICD-10-CM | POA: Diagnosis not present

## 2015-09-17 DIAGNOSIS — C61 Malignant neoplasm of prostate: Secondary | ICD-10-CM | POA: Diagnosis not present

## 2015-09-17 DIAGNOSIS — Z79899 Other long term (current) drug therapy: Secondary | ICD-10-CM | POA: Diagnosis not present

## 2015-09-17 DIAGNOSIS — Z9079 Acquired absence of other genital organ(s): Secondary | ICD-10-CM | POA: Diagnosis not present

## 2015-09-18 ENCOUNTER — Ambulatory Visit
Admission: RE | Admit: 2015-09-18 | Discharge: 2015-09-18 | Disposition: A | Discharge status: Home / Self Care | Payer: 59 | Source: Ambulatory Visit | Attending: Radiation Oncology | Admitting: Radiation Oncology

## 2015-09-18 ENCOUNTER — Ambulatory Visit: Payer: 59

## 2015-09-18 DIAGNOSIS — Z7982 Long term (current) use of aspirin: Secondary | ICD-10-CM | POA: Diagnosis not present

## 2015-09-18 DIAGNOSIS — C61 Malignant neoplasm of prostate: Secondary | ICD-10-CM | POA: Diagnosis not present

## 2015-09-18 DIAGNOSIS — Z79899 Other long term (current) drug therapy: Secondary | ICD-10-CM | POA: Diagnosis not present

## 2015-09-18 DIAGNOSIS — Z9079 Acquired absence of other genital organ(s): Secondary | ICD-10-CM | POA: Diagnosis not present

## 2015-09-18 DIAGNOSIS — E119 Type 2 diabetes mellitus without complications: Secondary | ICD-10-CM | POA: Diagnosis not present

## 2015-09-19 ENCOUNTER — Ambulatory Visit
Admission: RE | Admit: 2015-09-19 | Discharge: 2015-09-19 | Disposition: A | Discharge status: Home / Self Care | Payer: 59 | Source: Ambulatory Visit | Attending: Radiation Oncology | Admitting: Radiation Oncology

## 2015-09-19 ENCOUNTER — Ambulatory Visit: Payer: 59

## 2015-09-19 VITALS — BP 124/73 | HR 72 | Resp 16 | Wt 197.7 lb

## 2015-09-19 DIAGNOSIS — E119 Type 2 diabetes mellitus without complications: Secondary | ICD-10-CM | POA: Diagnosis not present

## 2015-09-19 DIAGNOSIS — Z9079 Acquired absence of other genital organ(s): Secondary | ICD-10-CM | POA: Diagnosis not present

## 2015-09-19 DIAGNOSIS — Z7982 Long term (current) use of aspirin: Secondary | ICD-10-CM | POA: Diagnosis not present

## 2015-09-19 DIAGNOSIS — C61 Malignant neoplasm of prostate: Secondary | ICD-10-CM

## 2015-09-19 DIAGNOSIS — Z79899 Other long term (current) drug therapy: Secondary | ICD-10-CM | POA: Diagnosis not present

## 2015-09-19 NOTE — Progress Notes (Signed)
  Radiation Oncology         [phone]   Name: Larry Davis MRN: [id number]   Date: 09/19/2015  DOB: [birth year]   Weekly Radiation Therapy Management    ICD-9-CM ICD-10-CM   1. Prostate cancer ([place]) 185 C61     Current Dose: 7.2 Gy  Planned Dose:  68.4  Gy  Narrative The patient presents for routine under treatment assessment. .  The patient is without complaint. Set-up films were reviewed. The chart was checked.  Physical Findings  weight is 197 lb 11.2 oz (89.7 kg). His blood pressure is 124/73 and his pulse is 72. His respiration is 16 and oxygen saturation is 100%. . Weight essentially stable.  No significant changes.  Impression The patient is tolerating radiation.  Plan Continue treatment as planned.         Sheral Apley Tammi Klippel, M.D.

## 2015-09-19 NOTE — Progress Notes (Signed)
Weight and vitals stable. Denies pain. Reports nocturia x 2. Denies dysuria or hematuria. Describes his urine stream as strong and steady. Denies difficulty emptying his bladder. Denies leakage or incontinence. Denies urinary urgency. Denies urinary frequency. Denies diarrhea. Denies fatigue.   BP 124/73   Pulse 72   Resp 16   Wt 197 lb 11.2 oz (89.7 kg)   SpO2 100%   BMI 30.06 kg/m  Wt Readings from Last 3 Encounters:  09/19/15 197 lb 11.2 oz (89.7 kg)  08/14/15 192 lb (87.1 kg)  08/11/15 196 lb 1.6 oz (89 kg)

## 2015-09-22 ENCOUNTER — Ambulatory Visit: Payer: 59

## 2015-09-22 ENCOUNTER — Ambulatory Visit
Admission: RE | Admit: 2015-09-22 | Discharge: 2015-09-22 | Disposition: A | Discharge status: Home / Self Care | Payer: 59 | Source: Ambulatory Visit | Attending: Radiation Oncology | Admitting: Radiation Oncology

## 2015-09-22 DIAGNOSIS — Z9079 Acquired absence of other genital organ(s): Secondary | ICD-10-CM | POA: Diagnosis not present

## 2015-09-22 DIAGNOSIS — E119 Type 2 diabetes mellitus without complications: Secondary | ICD-10-CM | POA: Diagnosis not present

## 2015-09-22 DIAGNOSIS — C61 Malignant neoplasm of prostate: Secondary | ICD-10-CM | POA: Diagnosis not present

## 2015-09-22 DIAGNOSIS — Z7982 Long term (current) use of aspirin: Secondary | ICD-10-CM | POA: Diagnosis not present

## 2015-09-22 DIAGNOSIS — Z79899 Other long term (current) drug therapy: Secondary | ICD-10-CM | POA: Diagnosis not present

## 2015-09-23 ENCOUNTER — Encounter: Payer: Self-pay | Admitting: Medical Oncology

## 2015-09-23 ENCOUNTER — Ambulatory Visit: Payer: 59

## 2015-09-23 ENCOUNTER — Ambulatory Visit
Admission: RE | Admit: 2015-09-23 | Discharge: 2015-09-23 | Disposition: A | Discharge status: Home / Self Care | Payer: 59 | Source: Ambulatory Visit | Attending: Radiation Oncology | Admitting: Radiation Oncology

## 2015-09-23 DIAGNOSIS — E119 Type 2 diabetes mellitus without complications: Secondary | ICD-10-CM | POA: Diagnosis not present

## 2015-09-23 DIAGNOSIS — Z9079 Acquired absence of other genital organ(s): Secondary | ICD-10-CM | POA: Diagnosis not present

## 2015-09-23 DIAGNOSIS — Z79899 Other long term (current) drug therapy: Secondary | ICD-10-CM | POA: Diagnosis not present

## 2015-09-23 DIAGNOSIS — Z7982 Long term (current) use of aspirin: Secondary | ICD-10-CM | POA: Diagnosis not present

## 2015-09-23 DIAGNOSIS — C61 Malignant neoplasm of prostate: Secondary | ICD-10-CM | POA: Diagnosis not present

## 2015-09-23 NOTE — Progress Notes (Signed)
Oncology Nurse Navigator Documentation  Oncology Nurse Navigator Flowsheets 09/15/2015 09/16/2015 09/23/2015  Navigator Location - - -  Navigator Encounter Type Telephone - Treatment  Telephone Incoming Call - -  Abnormal Finding Date - - -  Surgery Date - - -  Treatment Initiated Date - - -  Patient Visit Type - RadOnc RadOnc- Mr. Zaman doing well with his first week of radiation. States he is not experiencing any side effects. Will continue to follow.  Treatment Phase - First Radiation Tx Treatment  Barriers/Navigation Needs Financial;Family concerns Education No barriers at this time  Education - Pain/ Symptom Management;Concerns with Finances/ Eligibility -  Interventions Other Education Method None required  Education Method - Teach-back;Verbal -  Support Groups/Services - Friends and Family Friends and Family  Acuity - - -  Acuity Level 1 - - -  Time Spent with Patient [id number]

## 2015-09-24 ENCOUNTER — Ambulatory Visit: Payer: 59

## 2015-09-24 ENCOUNTER — Ambulatory Visit
Admission: RE | Admit: 2015-09-24 | Discharge: 2015-09-24 | Disposition: A | Discharge status: Home / Self Care | Payer: 59 | Source: Ambulatory Visit | Attending: Radiation Oncology | Admitting: Radiation Oncology

## 2015-09-24 VITALS — BP 129/79 | HR 80 | Resp 16 | Wt 198.8 lb

## 2015-09-24 DIAGNOSIS — Z79899 Other long term (current) drug therapy: Secondary | ICD-10-CM | POA: Diagnosis not present

## 2015-09-24 DIAGNOSIS — Z7982 Long term (current) use of aspirin: Secondary | ICD-10-CM | POA: Diagnosis not present

## 2015-09-24 DIAGNOSIS — E119 Type 2 diabetes mellitus without complications: Secondary | ICD-10-CM | POA: Diagnosis not present

## 2015-09-24 DIAGNOSIS — C61 Malignant neoplasm of prostate: Secondary | ICD-10-CM | POA: Diagnosis not present

## 2015-09-24 DIAGNOSIS — Z9079 Acquired absence of other genital organ(s): Secondary | ICD-10-CM | POA: Diagnosis not present

## 2015-09-24 NOTE — Progress Notes (Signed)
  Radiation Oncology         [phone] ________________________________  Name: Larry Davis MRN: [id number]  Date: 09/24/2015  DOB: 20-Feb-1955  Weekly Radiation Therapy Management    ICD-9-CM ICD-10-CM   1. Prostate cancer ([place]) 185 C61      Current Dose: 12.6 Gy     Planned Dose:  68.4 Gy  Narrative . . . . . . . . The patient presents for routine under treatment assessment.                                   The patient is without complaint.  . Denies pain. Reports nocturia x 2. Denies dysuria or hematuria. Describes his urine stream as strong and steady. Denies difficulty emptying his bladder. Denies leakage or incontinence. Denies urinary urgency. Denies urinary frequency. Denies diarrhea. Denies fatigue.                                  Set-up films were reviewed.                                 The chart was checked. Physical Findings. . .  weight is 198 lb 12.8 oz (90.2 kg). His blood pressure is 129/79 and his pulse is 80. His respiration is 16 and oxygen saturation is 100%. . The lungs are clear. The heart has a regular rhythm and rate. The abdomen is soft and nontender with normal bowel sounds Impression . . . . . . . The patient is tolerating radiation. Plan . . . . . . . . . . . . Continue treatment as planned.  ________________________________   Blair Promise, PhD, MD

## 2015-09-24 NOTE — Progress Notes (Signed)
Weight and vitals stable. Denies pain. Reports nocturia x 2. Denies dysuria or hematuria. Describes his urine stream as strong and steady. Denies difficulty emptying his bladder. Denies leakage or incontinence. Denies urinary urgency. Denies urinary frequency. Denies diarrhea. Denies fatigue.   BP 129/79 (BP Location: Left Arm, Cuff Size: Normal)   Pulse 80   Resp 16   Wt 198 lb 12.8 oz (90.2 kg)   SpO2 100%   BMI 30.23 kg/m  Wt Readings from Last 3 Encounters:  09/24/15 198 lb 12.8 oz (90.2 kg)  09/19/15 197 lb 11.2 oz (89.7 kg)  08/14/15 192 lb (87.1 kg)

## 2015-09-25 ENCOUNTER — Ambulatory Visit: Payer: 59

## 2015-09-25 ENCOUNTER — Ambulatory Visit
Admission: RE | Admit: 2015-09-25 | Discharge: 2015-09-25 | Disposition: A | Discharge status: Home / Self Care | Payer: 59 | Source: Ambulatory Visit | Attending: Radiation Oncology | Admitting: Radiation Oncology

## 2015-09-25 DIAGNOSIS — Z7982 Long term (current) use of aspirin: Secondary | ICD-10-CM | POA: Diagnosis not present

## 2015-09-25 DIAGNOSIS — E119 Type 2 diabetes mellitus without complications: Secondary | ICD-10-CM | POA: Diagnosis not present

## 2015-09-25 DIAGNOSIS — Z9079 Acquired absence of other genital organ(s): Secondary | ICD-10-CM | POA: Diagnosis not present

## 2015-09-25 DIAGNOSIS — C61 Malignant neoplasm of prostate: Secondary | ICD-10-CM | POA: Diagnosis not present

## 2015-09-25 DIAGNOSIS — Z79899 Other long term (current) drug therapy: Secondary | ICD-10-CM | POA: Diagnosis not present

## 2015-09-26 ENCOUNTER — Ambulatory Visit: Payer: 59

## 2015-09-26 ENCOUNTER — Ambulatory Visit
Admission: RE | Admit: 2015-09-26 | Discharge: 2015-09-26 | Disposition: A | Discharge status: Home / Self Care | Payer: 59 | Source: Ambulatory Visit | Attending: Radiation Oncology | Admitting: Radiation Oncology

## 2015-09-26 DIAGNOSIS — E119 Type 2 diabetes mellitus without complications: Secondary | ICD-10-CM | POA: Diagnosis not present

## 2015-09-26 DIAGNOSIS — Z79899 Other long term (current) drug therapy: Secondary | ICD-10-CM | POA: Diagnosis not present

## 2015-09-26 DIAGNOSIS — Z7982 Long term (current) use of aspirin: Secondary | ICD-10-CM | POA: Diagnosis not present

## 2015-09-26 DIAGNOSIS — C61 Malignant neoplasm of prostate: Secondary | ICD-10-CM | POA: Diagnosis not present

## 2015-09-26 DIAGNOSIS — Z9079 Acquired absence of other genital organ(s): Secondary | ICD-10-CM | POA: Diagnosis not present

## 2015-09-27 ENCOUNTER — Other Ambulatory Visit: Payer: Self-pay | Admitting: Family

## 2015-09-29 ENCOUNTER — Ambulatory Visit
Admission: RE | Admit: 2015-09-29 | Discharge: 2015-09-29 | Disposition: A | Discharge status: Home / Self Care | Payer: 59 | Source: Ambulatory Visit | Attending: Radiation Oncology | Admitting: Radiation Oncology

## 2015-09-29 ENCOUNTER — Ambulatory Visit: Payer: 59

## 2015-09-29 DIAGNOSIS — C61 Malignant neoplasm of prostate: Secondary | ICD-10-CM | POA: Diagnosis not present

## 2015-09-29 DIAGNOSIS — Z79899 Other long term (current) drug therapy: Secondary | ICD-10-CM | POA: Diagnosis not present

## 2015-09-29 DIAGNOSIS — Z7982 Long term (current) use of aspirin: Secondary | ICD-10-CM | POA: Diagnosis not present

## 2015-09-29 DIAGNOSIS — E119 Type 2 diabetes mellitus without complications: Secondary | ICD-10-CM | POA: Diagnosis not present

## 2015-09-29 DIAGNOSIS — Z9079 Acquired absence of other genital organ(s): Secondary | ICD-10-CM | POA: Diagnosis not present

## 2015-09-29 MED ORDER — ZOLPIDEM TARTRATE 10 MG PO TABS: 10.0000 mg | ORAL_TABLET | Freq: Every evening | ORAL | 0 refills | Status: DC | PRN

## 2015-09-29 MED FILL — ZOLPIDEM TARTRATE 10 MG TAB: 10 | 30 days supply | Qty: 30 | Fill #0

## 2015-09-29 NOTE — Telephone Encounter (Signed)
Last zolpidem Rx: 05/29/15, #30 UDS: moderate 05/06/14, will collect at pt's next OV Last OV: 08/12/15 Next OV: due 11/12/15  Rx printed and forwarded to PCP for signature.

## 2015-09-29 NOTE — Telephone Encounter (Signed)
Rx faxed to pharmacy  

## 2015-09-30 ENCOUNTER — Ambulatory Visit
Admission: RE | Admit: 2015-09-30 | Discharge: 2015-09-30 | Disposition: A | Discharge status: Home / Self Care | Payer: 59 | Source: Ambulatory Visit | Attending: Radiation Oncology | Admitting: Radiation Oncology

## 2015-09-30 ENCOUNTER — Ambulatory Visit: Payer: 59

## 2015-09-30 DIAGNOSIS — E119 Type 2 diabetes mellitus without complications: Secondary | ICD-10-CM | POA: Diagnosis not present

## 2015-09-30 DIAGNOSIS — Z79899 Other long term (current) drug therapy: Secondary | ICD-10-CM | POA: Diagnosis not present

## 2015-09-30 DIAGNOSIS — Z7982 Long term (current) use of aspirin: Secondary | ICD-10-CM | POA: Diagnosis not present

## 2015-09-30 DIAGNOSIS — C61 Malignant neoplasm of prostate: Secondary | ICD-10-CM | POA: Diagnosis not present

## 2015-09-30 DIAGNOSIS — Z9079 Acquired absence of other genital organ(s): Secondary | ICD-10-CM | POA: Diagnosis not present

## 2015-10-01 ENCOUNTER — Ambulatory Visit: Payer: 59

## 2015-10-01 ENCOUNTER — Ambulatory Visit
Admission: RE | Admit: 2015-10-01 | Discharge: 2015-10-01 | Disposition: A | Discharge status: Home / Self Care | Payer: 59 | Source: Ambulatory Visit | Attending: Radiation Oncology | Admitting: Radiation Oncology

## 2015-10-01 ENCOUNTER — Encounter: Payer: Self-pay | Admitting: Medical Oncology

## 2015-10-01 DIAGNOSIS — Z7982 Long term (current) use of aspirin: Secondary | ICD-10-CM | POA: Diagnosis not present

## 2015-10-01 DIAGNOSIS — Z9079 Acquired absence of other genital organ(s): Secondary | ICD-10-CM | POA: Diagnosis not present

## 2015-10-01 DIAGNOSIS — Z79899 Other long term (current) drug therapy: Secondary | ICD-10-CM | POA: Diagnosis not present

## 2015-10-01 DIAGNOSIS — E119 Type 2 diabetes mellitus without complications: Secondary | ICD-10-CM | POA: Diagnosis not present

## 2015-10-01 DIAGNOSIS — C61 Malignant neoplasm of prostate: Secondary | ICD-10-CM | POA: Diagnosis not present

## 2015-10-01 NOTE — Progress Notes (Signed)
Oncology Nurse Navigator Documentation  Oncology Nurse Navigator Flowsheets 09/16/2015 09/23/2015 10/01/2015  Navigator Location - - -  Navigator Encounter Type - Treatment Treatment  Telephone - - -  Abnormal Finding Date - - -  Surgery Date - - -  Treatment Initiated Date - - -  Patient Visit Type RadOnc RadOnc RadOnc  Treatment Phase First Radiation Tx Treatment Treatment- Tolerating treatment well. States he has some mild fatigue but doing well.  Barriers/Navigation Needs Education No barriers at this time -  Education Pain/ Symptom Management;Concerns with Finances/ Eligibility - -  Interventions Education Method None required -  Education Method Teach-back;Verbal - -  Support Groups/Services Friends and Family Friends and Family -  Acuity - - -  Acuity Level 1 - - -  Time Spent with Patient [id number]

## 2015-10-02 ENCOUNTER — Ambulatory Visit
Admission: RE | Admit: 2015-10-02 | Discharge: 2015-10-02 | Disposition: A | Discharge status: Home / Self Care | Payer: 59 | Source: Ambulatory Visit | Attending: Radiation Oncology | Admitting: Radiation Oncology

## 2015-10-02 ENCOUNTER — Ambulatory Visit: Payer: 59

## 2015-10-02 VITALS — BP 133/74 | HR 63 | Resp 16 | Wt 198.1 lb

## 2015-10-02 DIAGNOSIS — Z7982 Long term (current) use of aspirin: Secondary | ICD-10-CM | POA: Diagnosis not present

## 2015-10-02 DIAGNOSIS — Z9079 Acquired absence of other genital organ(s): Secondary | ICD-10-CM | POA: Diagnosis not present

## 2015-10-02 DIAGNOSIS — C61 Malignant neoplasm of prostate: Secondary | ICD-10-CM

## 2015-10-02 DIAGNOSIS — Z79899 Other long term (current) drug therapy: Secondary | ICD-10-CM | POA: Diagnosis not present

## 2015-10-02 DIAGNOSIS — E119 Type 2 diabetes mellitus without complications: Secondary | ICD-10-CM | POA: Diagnosis not present

## 2015-10-02 NOTE — Progress Notes (Signed)
Weight and vitals stable. Denies pain. Reports nocturia x 2-3. Denies dysuria or hematuria. Describes his urine stream as strong and steady. Denies difficulty emptying his bladder. Denies leakage or incontinence. Denies urinary urgency. Denies urinary frequency. Denies diarrhea. Denies fatigue.   BP 133/74   Pulse 63   Resp 16   Wt 198 lb 1.6 oz (89.9 kg)   SpO2 100%   BMI 30.12 kg/m  Wt Readings from Last 3 Encounters:  10/02/15 198 lb 1.6 oz (89.9 kg)  09/24/15 198 lb 12.8 oz (90.2 kg)  09/19/15 197 lb 11.2 oz (89.7 kg)

## 2015-10-02 NOTE — Progress Notes (Signed)
  Radiation Oncology         [phone]   Name: Larry Davis MRN: [id number]   Date: 10/02/2015  DOB: 1955-10-20     Weekly Radiation Therapy Management    ICD-9-CM ICD-10-CM   1. Prostate cancer ([place]) 185 C61     Current Dose: 23.4 Gy  Planned Dose:  68.4 Gy  Narrative The patient presents for routine under treatment assessment.  Weight and vitals stable. Denies pain. Reports nocturia x 2-3. Denies dysuria or hematuria. Describes his urine stream as strong and steady. Denies difficulty emptying his bladder. Denies leakage or incontinence. Denies urinary urgency or frequency. Denies diarrhea. Reports very mild fatigue, still able to do everything he needs to do.  The patient is without complaint. Set-up films were reviewed. The chart was checked.  Physical Findings  weight is 198 lb 1.6 oz (89.9 kg). His blood pressure is 133/74 and his pulse is 63. His respiration is 16 and oxygen saturation is 100%. . Weight essentially stable.  No significant changes. Alert, in no acute distress.  Impression The patient is tolerating radiation.  Plan Continue treatment as planned.         Sheral Apley Tammi Klippel, M.D.  This document serves as a record of services personally performed by Tyler Pita, MD. It was created on his behalf by Arlyce Harman, a trained medical scribe. The creation of this record is based on the scribe's personal observations and the provider's statements to them. This document has been checked and approved by the attending provider.

## 2015-10-03 ENCOUNTER — Ambulatory Visit
Admission: RE | Admit: 2015-10-03 | Discharge: 2015-10-03 | Disposition: A | Discharge status: Home / Self Care | Payer: 59 | Source: Ambulatory Visit | Attending: Radiation Oncology | Admitting: Radiation Oncology

## 2015-10-03 ENCOUNTER — Ambulatory Visit: Payer: 59

## 2015-10-03 DIAGNOSIS — C61 Malignant neoplasm of prostate: Secondary | ICD-10-CM | POA: Diagnosis not present

## 2015-10-03 DIAGNOSIS — Z7982 Long term (current) use of aspirin: Secondary | ICD-10-CM | POA: Diagnosis not present

## 2015-10-03 DIAGNOSIS — E119 Type 2 diabetes mellitus without complications: Secondary | ICD-10-CM | POA: Diagnosis not present

## 2015-10-03 DIAGNOSIS — Z79899 Other long term (current) drug therapy: Secondary | ICD-10-CM | POA: Diagnosis not present

## 2015-10-03 DIAGNOSIS — Z9079 Acquired absence of other genital organ(s): Secondary | ICD-10-CM | POA: Diagnosis not present

## 2015-10-06 ENCOUNTER — Ambulatory Visit
Admission: RE | Admit: 2015-10-06 | Discharge: 2015-10-06 | Disposition: A | Discharge status: Home / Self Care | Payer: 59 | Source: Ambulatory Visit | Attending: Radiation Oncology | Admitting: Radiation Oncology

## 2015-10-06 ENCOUNTER — Ambulatory Visit: Payer: 59

## 2015-10-06 DIAGNOSIS — Z79899 Other long term (current) drug therapy: Secondary | ICD-10-CM | POA: Diagnosis not present

## 2015-10-06 DIAGNOSIS — E119 Type 2 diabetes mellitus without complications: Secondary | ICD-10-CM | POA: Diagnosis not present

## 2015-10-06 DIAGNOSIS — Z9079 Acquired absence of other genital organ(s): Secondary | ICD-10-CM | POA: Diagnosis not present

## 2015-10-06 DIAGNOSIS — C61 Malignant neoplasm of prostate: Secondary | ICD-10-CM | POA: Diagnosis not present

## 2015-10-06 DIAGNOSIS — Z7982 Long term (current) use of aspirin: Secondary | ICD-10-CM | POA: Diagnosis not present

## 2015-10-07 ENCOUNTER — Ambulatory Visit: Payer: 59

## 2015-10-07 ENCOUNTER — Ambulatory Visit
Admission: RE | Admit: 2015-10-07 | Discharge: 2015-10-07 | Disposition: A | Discharge status: Home / Self Care | Payer: 59 | Source: Ambulatory Visit | Attending: Radiation Oncology | Admitting: Radiation Oncology

## 2015-10-07 DIAGNOSIS — C61 Malignant neoplasm of prostate: Secondary | ICD-10-CM | POA: Diagnosis not present

## 2015-10-07 DIAGNOSIS — Z7982 Long term (current) use of aspirin: Secondary | ICD-10-CM | POA: Diagnosis not present

## 2015-10-07 DIAGNOSIS — Z9079 Acquired absence of other genital organ(s): Secondary | ICD-10-CM | POA: Diagnosis not present

## 2015-10-07 DIAGNOSIS — Z79899 Other long term (current) drug therapy: Secondary | ICD-10-CM | POA: Diagnosis not present

## 2015-10-07 DIAGNOSIS — E119 Type 2 diabetes mellitus without complications: Secondary | ICD-10-CM | POA: Diagnosis not present

## 2015-10-08 ENCOUNTER — Ambulatory Visit: Payer: 59

## 2015-10-08 ENCOUNTER — Ambulatory Visit
Admission: RE | Admit: 2015-10-08 | Discharge: 2015-10-08 | Disposition: A | Discharge status: Home / Self Care | Payer: 59 | Source: Ambulatory Visit | Attending: Radiation Oncology | Admitting: Radiation Oncology

## 2015-10-08 DIAGNOSIS — Z7982 Long term (current) use of aspirin: Secondary | ICD-10-CM | POA: Diagnosis not present

## 2015-10-08 DIAGNOSIS — C61 Malignant neoplasm of prostate: Secondary | ICD-10-CM | POA: Diagnosis not present

## 2015-10-08 DIAGNOSIS — Z79899 Other long term (current) drug therapy: Secondary | ICD-10-CM | POA: Diagnosis not present

## 2015-10-08 DIAGNOSIS — E119 Type 2 diabetes mellitus without complications: Secondary | ICD-10-CM | POA: Diagnosis not present

## 2015-10-08 DIAGNOSIS — Z9079 Acquired absence of other genital organ(s): Secondary | ICD-10-CM | POA: Diagnosis not present

## 2015-10-09 ENCOUNTER — Ambulatory Visit
Admission: RE | Admit: 2015-10-09 | Discharge: 2015-10-09 | Disposition: A | Discharge status: Home / Self Care | Payer: 59 | Source: Ambulatory Visit | Attending: Radiation Oncology | Admitting: Radiation Oncology

## 2015-10-09 ENCOUNTER — Ambulatory Visit: Payer: 59

## 2015-10-09 DIAGNOSIS — E119 Type 2 diabetes mellitus without complications: Secondary | ICD-10-CM | POA: Diagnosis not present

## 2015-10-09 DIAGNOSIS — Z79899 Other long term (current) drug therapy: Secondary | ICD-10-CM | POA: Diagnosis not present

## 2015-10-09 DIAGNOSIS — Z9079 Acquired absence of other genital organ(s): Secondary | ICD-10-CM | POA: Diagnosis not present

## 2015-10-09 DIAGNOSIS — Z7982 Long term (current) use of aspirin: Secondary | ICD-10-CM | POA: Diagnosis not present

## 2015-10-09 DIAGNOSIS — C61 Malignant neoplasm of prostate: Secondary | ICD-10-CM | POA: Diagnosis not present

## 2015-10-10 ENCOUNTER — Ambulatory Visit
Admission: RE | Admit: 2015-10-10 | Discharge: 2015-10-10 | Disposition: A | Discharge status: Home / Self Care | Payer: 59 | Source: Ambulatory Visit | Attending: Radiation Oncology | Admitting: Radiation Oncology

## 2015-10-10 ENCOUNTER — Ambulatory Visit: Payer: 59

## 2015-10-10 ENCOUNTER — Encounter: Payer: Self-pay | Admitting: Radiation Oncology

## 2015-10-10 VITALS — BP 132/74 | HR 67 | Temp 98.2°F | Resp 20 | Wt 199.6 lb

## 2015-10-10 DIAGNOSIS — Z79899 Other long term (current) drug therapy: Secondary | ICD-10-CM | POA: Diagnosis not present

## 2015-10-10 DIAGNOSIS — Z7982 Long term (current) use of aspirin: Secondary | ICD-10-CM | POA: Diagnosis not present

## 2015-10-10 DIAGNOSIS — C61 Malignant neoplasm of prostate: Secondary | ICD-10-CM

## 2015-10-10 DIAGNOSIS — Z9079 Acquired absence of other genital organ(s): Secondary | ICD-10-CM | POA: Diagnosis not present

## 2015-10-10 DIAGNOSIS — E119 Type 2 diabetes mellitus without complications: Secondary | ICD-10-CM | POA: Diagnosis not present

## 2015-10-10 NOTE — Progress Notes (Signed)
   Department of Radiation Oncology  Phone:  [phone] Fax:        [phone]  Weekly Treatment Note    Name: MORTY ORTWEIN Date: 10/12/2015 MRN: [id number] DOB: 1955/10/07   Diagnosis:     ICD-9-CM ICD-10-CM   1. Prostate cancer ([place]) 185 C61      Current dose: 34.2 Gy  Current fraction:19   MEDICATIONS: Current Outpatient Prescriptions  Medication Sig Dispense Refill  . aspirin 81 MG tablet Take 1 tablet (81 mg total) by mouth daily. 30 tablet   . atorvastatin (LIPITOR) 20 MG tablet Take 1 tablet (20 mg total) by mouth daily. 90 tablet 1  . Blood Glucose Monitoring Suppl (TRUE METRIX METER) W/DEVICE KIT Use to check blood sugar DX E11.9 1 kit 0  . canagliflozin (INVOKANA) 300 MG TABS tablet Take 1 tablet (300 mg total) by mouth daily before breakfast. 90 tablet 1  . glucose blood test strip Use as instructed to check blood sugar once a day.  DX E11.9 100 each 1  . metFORMIN (GLUCOPHAGE) 500 MG tablet   3  . sitaGLIPtin (JANUVIA) 100 MG tablet Take 1 tablet (100 mg total) by mouth daily. 90 tablet 1  . TRUEPLUS LANCETS 33G MISC Use to check blood sugar once a day. DX E11.9 100 each 1  . zolpidem (AMBIEN) 10 MG tablet Take 1 tablet (10 mg total) by mouth at bedtime as needed for sleep. 30 tablet 0   No current facility-administered medications for this encounter.      ALLERGIES: Metformin and related   LABORATORY DATA:  Lab Results  Component Value Date   WBC 8.0 07/30/2014   HGB 15.1 07/30/2014   HCT 44.0 07/30/2014   MCV 87.9 07/30/2014   PLT 204.0 07/30/2014   Lab Results  Component Value Date   NA 137 08/11/2015   K 3.8 08/11/2015   CL 103 08/11/2015   CO2 25 08/11/2015   Lab Results  Component Value Date   ALT 20 03/11/2015   AST 12 03/11/2015   ALKPHOS 60 03/11/2015   BILITOT 0.6 03/11/2015     NARRATIVE: Danella Sensing was seen today for weekly treatment management. The chart was checked and the patient's films were reviewed.  He  denies dysuria hematuria, nocturia, diarrhea, irritation, or pain. He has a good appetite and mild fatigue.  PHYSICAL EXAMINATION: weight is 199 lb 9.6 oz (90.5 kg). His oral temperature is 98.2 F (36.8 C). His blood pressure is 132/74 and his pulse is 67. His respiration is 20.   ASSESSMENT: The patient is doing satisfactorily with treatment.  PLAN: We will continue with the patient's radiation treatment as planned.  This document serves as a record of services personally performed by Kyung Rudd, MD. It was created on his behalf by Darcus Austin, a trained medical scribe. The creation of this record is based on the scribe's personal observations and the provider's statements to them. This document has been checked and approved by the attending provider.

## 2015-10-10 NOTE — Progress Notes (Addendum)
Weekly rad txs prostate 19/38 completed, no dysuria, no hematuria, no nocturia, regular bowels, appetite good,  No irritation  Or pain, mild fatigue 8:29 AM  Wt Readings from Last 3 Encounters:  10/10/15 199 lb 9.6 oz (90.5 kg)  10/02/15 198 lb 1.6 oz (89.9 kg)  09/24/15 198 lb 12.8 oz (90.2 kg)   .BP 132/74 (BP Location: Right Arm, Patient Position: Sitting, Cuff Size: Normal)   Pulse 67   Temp 98.2 F (36.8 C) (Oral)   Resp 20   Wt 199 lb 9.6 oz (90.5 kg)   BMI 30.35 kg/m

## 2015-10-13 ENCOUNTER — Ambulatory Visit: Payer: 59

## 2015-10-13 ENCOUNTER — Ambulatory Visit
Admission: RE | Admit: 2015-10-13 | Discharge: 2015-10-13 | Disposition: A | Discharge status: Home / Self Care | Payer: 59 | Source: Ambulatory Visit | Attending: Radiation Oncology | Admitting: Radiation Oncology

## 2015-10-13 DIAGNOSIS — Z9079 Acquired absence of other genital organ(s): Secondary | ICD-10-CM | POA: Diagnosis not present

## 2015-10-13 DIAGNOSIS — Z7982 Long term (current) use of aspirin: Secondary | ICD-10-CM | POA: Diagnosis not present

## 2015-10-13 DIAGNOSIS — C61 Malignant neoplasm of prostate: Secondary | ICD-10-CM | POA: Diagnosis not present

## 2015-10-13 DIAGNOSIS — E119 Type 2 diabetes mellitus without complications: Secondary | ICD-10-CM | POA: Diagnosis not present

## 2015-10-13 DIAGNOSIS — Z79899 Other long term (current) drug therapy: Secondary | ICD-10-CM | POA: Diagnosis not present

## 2015-10-14 ENCOUNTER — Ambulatory Visit
Admission: RE | Admit: 2015-10-14 | Discharge: 2015-10-14 | Disposition: A | Discharge status: Home / Self Care | Payer: 59 | Source: Ambulatory Visit | Attending: Radiation Oncology | Admitting: Radiation Oncology

## 2015-10-14 DIAGNOSIS — Z79899 Other long term (current) drug therapy: Secondary | ICD-10-CM | POA: Diagnosis not present

## 2015-10-14 DIAGNOSIS — Z9079 Acquired absence of other genital organ(s): Secondary | ICD-10-CM | POA: Diagnosis not present

## 2015-10-14 DIAGNOSIS — C61 Malignant neoplasm of prostate: Secondary | ICD-10-CM | POA: Diagnosis not present

## 2015-10-14 DIAGNOSIS — Z7982 Long term (current) use of aspirin: Secondary | ICD-10-CM | POA: Diagnosis not present

## 2015-10-14 DIAGNOSIS — E119 Type 2 diabetes mellitus without complications: Secondary | ICD-10-CM | POA: Diagnosis not present

## 2015-10-15 ENCOUNTER — Encounter: Payer: Self-pay | Admitting: Medical Oncology

## 2015-10-15 ENCOUNTER — Ambulatory Visit
Admission: RE | Admit: 2015-10-15 | Discharge: 2015-10-15 | Disposition: A | Discharge status: Home / Self Care | Payer: 59 | Source: Ambulatory Visit | Attending: Radiation Oncology | Admitting: Radiation Oncology

## 2015-10-15 DIAGNOSIS — Z79899 Other long term (current) drug therapy: Secondary | ICD-10-CM | POA: Diagnosis not present

## 2015-10-15 DIAGNOSIS — Z7982 Long term (current) use of aspirin: Secondary | ICD-10-CM | POA: Diagnosis not present

## 2015-10-15 DIAGNOSIS — Z9079 Acquired absence of other genital organ(s): Secondary | ICD-10-CM | POA: Diagnosis not present

## 2015-10-15 DIAGNOSIS — C61 Malignant neoplasm of prostate: Secondary | ICD-10-CM | POA: Diagnosis not present

## 2015-10-15 DIAGNOSIS — E119 Type 2 diabetes mellitus without complications: Secondary | ICD-10-CM | POA: Diagnosis not present

## 2015-10-15 NOTE — Progress Notes (Signed)
Oncology Nurse Navigator Documentation  Oncology Nurse Navigator Flowsheets 09/16/2015 09/23/2015 10/01/2015  Navigator Location - - -  Navigator Encounter Type - Treatment Treatment  Telephone - - -  Abnormal Finding Date - - -  Surgery Date - - -  Treatment Initiated Date - - -  Patient Visit Type RadOnc RadOnc RadOnc  Treatment Phase First Radiation Tx Treatment Treatment  Barriers/Navigation Needs Education No barriers at this time -  Education Pain/ Symptom Management;Concerns with Finances/ Eligibility - -  Interventions Education Method None required -  Education Method Teach-back;Verbal - -  Support Groups/Services Friends and Family Friends and Family -  Acuity - - -  Acuity Level 1 - - -  Time Spent with Patient 30 15 15   Larry Davis is doing well with radiation except his fatigue. Today is 22/38 treatments. No urinary symptoms but an increased in being fatigued. He states he is not pushing himself and taking rest periods.

## 2015-10-16 ENCOUNTER — Ambulatory Visit
Admission: RE | Admit: 2015-10-16 | Discharge: 2015-10-16 | Disposition: A | Discharge status: Home / Self Care | Payer: 59 | Source: Ambulatory Visit | Attending: Radiation Oncology | Admitting: Radiation Oncology

## 2015-10-16 VITALS — BP 136/80 | HR 75 | Temp 98.2°F | Resp 12 | Wt 198.6 lb

## 2015-10-16 DIAGNOSIS — Z7982 Long term (current) use of aspirin: Secondary | ICD-10-CM | POA: Diagnosis not present

## 2015-10-16 DIAGNOSIS — C61 Malignant neoplasm of prostate: Secondary | ICD-10-CM

## 2015-10-16 DIAGNOSIS — Z79899 Other long term (current) drug therapy: Secondary | ICD-10-CM | POA: Diagnosis not present

## 2015-10-16 DIAGNOSIS — Z9079 Acquired absence of other genital organ(s): Secondary | ICD-10-CM | POA: Diagnosis not present

## 2015-10-16 DIAGNOSIS — E119 Type 2 diabetes mellitus without complications: Secondary | ICD-10-CM | POA: Diagnosis not present

## 2015-10-16 NOTE — Progress Notes (Signed)
  Radiation Oncology         [phone]   Name: Larry Davis MRN: [id number]   Date: 10/16/2015  DOB: September 06, 1955    Weekly Radiation Therapy Management    ICD-9-CM ICD-10-CM   1. Prostate cancer ([place]) 185 C61     Current Dose: 41.4 Gy  Planned Dose:  68.4 Gy  Narrative The patient presents for routine under treatment assessment. Patient reports no pain at this time. Patient reports urinary frequency and urgency. Patient reports nocturia 2-3 x. Patient reports diarrhea and constipation depending on diet. Patient also notes fatigue. The patient is without complaint. Set-up films were reviewed. The chart was checked.  Physical Findings  weight is 198 lb 9.6 oz (90.1 kg). His oral temperature is 98.2 F (36.8 C). His blood pressure is 136/80 and his pulse is 75. His respiration is 12 and oxygen saturation is 97%. . Weight essentially stable.  No significant changes.  Impression The patient is tolerating radiation.  Plan Continue treatment as planned.         Sheral Apley Tammi Klippel, M.D. This document serves as a record of services personally performed by Tyler Pita, MD. It was created on his behalf by Bethann Humble, a trained medical scribe. The creation of this record is based on the scribe's personal observations and the provider's statements to them. This document has been checked and approved by the attending provider.

## 2015-10-16 NOTE — Progress Notes (Signed)
PAIN: He is currently in no pain. URINARY: Pt reports urinary frequency and urgency. Pt states they urinate 2 - 3 times per night.  BOWEL: Pt reports Diarrhea and Constipation varies depending on diet. OTHER: Pt complains of fatigue BP 136/80   Pulse 75   Temp 98.2 F (36.8 C) (Oral)   Resp 12   Wt 198 lb 9.6 oz (90.1 kg)   SpO2 97%   BMI 30.20 kg/m  Wt Readings from Last 3 Encounters:  10/16/15 198 lb 9.6 oz (90.1 kg)  10/10/15 199 lb 9.6 oz (90.5 kg)  10/02/15 198 lb 1.6 oz (89.9 kg)

## 2015-10-17 ENCOUNTER — Ambulatory Visit
Admission: RE | Admit: 2015-10-17 | Discharge: 2015-10-17 | Disposition: A | Discharge status: Home / Self Care | Payer: 59 | Source: Ambulatory Visit | Attending: Radiation Oncology | Admitting: Radiation Oncology

## 2015-10-17 DIAGNOSIS — Z7982 Long term (current) use of aspirin: Secondary | ICD-10-CM | POA: Diagnosis not present

## 2015-10-17 DIAGNOSIS — C61 Malignant neoplasm of prostate: Secondary | ICD-10-CM | POA: Diagnosis not present

## 2015-10-17 DIAGNOSIS — E119 Type 2 diabetes mellitus without complications: Secondary | ICD-10-CM | POA: Diagnosis not present

## 2015-10-17 DIAGNOSIS — Z9079 Acquired absence of other genital organ(s): Secondary | ICD-10-CM | POA: Diagnosis not present

## 2015-10-17 DIAGNOSIS — Z79899 Other long term (current) drug therapy: Secondary | ICD-10-CM | POA: Diagnosis not present

## 2015-10-21 ENCOUNTER — Ambulatory Visit
Admission: RE | Admit: 2015-10-21 | Discharge: 2015-10-21 | Disposition: A | Discharge status: Home / Self Care | Payer: 59 | Source: Ambulatory Visit | Attending: Radiation Oncology | Admitting: Radiation Oncology

## 2015-10-21 DIAGNOSIS — E119 Type 2 diabetes mellitus without complications: Secondary | ICD-10-CM | POA: Diagnosis not present

## 2015-10-21 DIAGNOSIS — Z79899 Other long term (current) drug therapy: Secondary | ICD-10-CM | POA: Diagnosis not present

## 2015-10-21 DIAGNOSIS — Z9079 Acquired absence of other genital organ(s): Secondary | ICD-10-CM | POA: Diagnosis not present

## 2015-10-21 DIAGNOSIS — C61 Malignant neoplasm of prostate: Secondary | ICD-10-CM | POA: Diagnosis not present

## 2015-10-21 DIAGNOSIS — Z7982 Long term (current) use of aspirin: Secondary | ICD-10-CM | POA: Diagnosis not present

## 2015-10-22 ENCOUNTER — Ambulatory Visit
Admission: RE | Admit: 2015-10-22 | Discharge: 2015-10-22 | Disposition: A | Discharge status: Home / Self Care | Payer: 59 | Source: Ambulatory Visit | Attending: Radiation Oncology | Admitting: Radiation Oncology

## 2015-10-22 DIAGNOSIS — Z79899 Other long term (current) drug therapy: Secondary | ICD-10-CM | POA: Diagnosis not present

## 2015-10-22 DIAGNOSIS — Z9079 Acquired absence of other genital organ(s): Secondary | ICD-10-CM | POA: Diagnosis not present

## 2015-10-22 DIAGNOSIS — C61 Malignant neoplasm of prostate: Secondary | ICD-10-CM | POA: Diagnosis not present

## 2015-10-22 DIAGNOSIS — E119 Type 2 diabetes mellitus without complications: Secondary | ICD-10-CM | POA: Diagnosis not present

## 2015-10-22 DIAGNOSIS — Z7982 Long term (current) use of aspirin: Secondary | ICD-10-CM | POA: Diagnosis not present

## 2015-10-23 ENCOUNTER — Ambulatory Visit
Admission: RE | Admit: 2015-10-23 | Discharge: 2015-10-23 | Disposition: A | Discharge status: Home / Self Care | Payer: 59 | Source: Ambulatory Visit | Attending: Radiation Oncology | Admitting: Radiation Oncology

## 2015-10-23 VITALS — BP 132/81 | HR 67 | Resp 16 | Wt 199.2 lb

## 2015-10-23 DIAGNOSIS — Z7982 Long term (current) use of aspirin: Secondary | ICD-10-CM | POA: Diagnosis not present

## 2015-10-23 DIAGNOSIS — Z79899 Other long term (current) drug therapy: Secondary | ICD-10-CM | POA: Diagnosis not present

## 2015-10-23 DIAGNOSIS — C61 Malignant neoplasm of prostate: Secondary | ICD-10-CM | POA: Diagnosis not present

## 2015-10-23 DIAGNOSIS — Z9079 Acquired absence of other genital organ(s): Secondary | ICD-10-CM | POA: Diagnosis not present

## 2015-10-23 DIAGNOSIS — E119 Type 2 diabetes mellitus without complications: Secondary | ICD-10-CM | POA: Diagnosis not present

## 2015-10-23 NOTE — Progress Notes (Signed)
Weight and vitals stable. Denies pain. Reports nocturia x 2-3. Denies dysuria or hematuria. Describes a steady urine stream without difficulty emptying. Denies leakage or incontinence. Denies urinary urgency or frequency. Denies diarrhea. Denies fatigue.   BP 132/81 (BP Location: Left Arm, Patient Position: Sitting, Cuff Size: Normal)   Pulse 67   Resp 16   Wt 199 lb 3.2 oz (90.4 kg)   SpO2 100%   BMI 30.29 kg/m  Wt Readings from Last 3 Encounters:  10/23/15 199 lb 3.2 oz (90.4 kg)  10/16/15 198 lb 9.6 oz (90.1 kg)  10/10/15 199 lb 9.6 oz (90.5 kg)

## 2015-10-23 NOTE — Progress Notes (Signed)
  Radiation Oncology         [phone]   Name: Larry Davis MRN: [id number]   Date: 10/23/2015  DOB: 1955-10-12    Weekly Radiation Therapy Management    ICD-9-CM ICD-10-CM   1. Prostate cancer ([place]) 185 C61     Current Dose: 48.6 Gy  Planned Dose:  68.4 Gy  Narrative The patient presents for routine under treatment assessment.  Weight and vitals stable. Denies pain. Reports nocturia x 2-3. Denies dysuria or hematuria. Describes a steady urine stream without difficulty emptying. Denies leakage or incontinence. Denies urinary frequency or urgency. Denies diarrhea. Denies fatigue. He feels a lot less tired this week than last.  Set-up films were reviewed. The chart was checked.  Physical Findings  weight is 199 lb 3.2 oz (90.4 kg). His blood pressure is 132/81 and his pulse is 67. His respiration is 16 and oxygen saturation is 100%. . Weight essentially stable.  No significant changes. Alert, in no acute distress.   Impression The patient is tolerating radiation.  Plan Continue treatment as planned.         Sheral Apley Tammi Klippel, M.D.  This document serves as a record of services personally performed by Tyler Pita, MD. It was created on his behalf by Arlyce Harman, a trained medical scribe. The creation of this record is based on the scribe's personal observations and the provider's statements to them. This document has been checked and approved by the attending provider.

## 2015-10-24 ENCOUNTER — Ambulatory Visit
Admission: RE | Admit: 2015-10-24 | Discharge: 2015-10-24 | Disposition: A | Discharge status: Home / Self Care | Payer: 59 | Source: Ambulatory Visit | Attending: Radiation Oncology | Admitting: Radiation Oncology

## 2015-10-24 DIAGNOSIS — Z79899 Other long term (current) drug therapy: Secondary | ICD-10-CM | POA: Diagnosis not present

## 2015-10-24 DIAGNOSIS — Z9079 Acquired absence of other genital organ(s): Secondary | ICD-10-CM | POA: Diagnosis not present

## 2015-10-24 DIAGNOSIS — Z7982 Long term (current) use of aspirin: Secondary | ICD-10-CM | POA: Diagnosis not present

## 2015-10-24 DIAGNOSIS — C61 Malignant neoplasm of prostate: Secondary | ICD-10-CM | POA: Diagnosis not present

## 2015-10-24 DIAGNOSIS — E119 Type 2 diabetes mellitus without complications: Secondary | ICD-10-CM | POA: Diagnosis not present

## 2015-10-27 ENCOUNTER — Ambulatory Visit
Admission: RE | Admit: 2015-10-27 | Discharge: 2015-10-27 | Disposition: A | Discharge status: Home / Self Care | Payer: 59 | Source: Ambulatory Visit | Attending: Radiation Oncology | Admitting: Radiation Oncology

## 2015-10-27 ENCOUNTER — Encounter: Payer: Self-pay | Admitting: Family

## 2015-10-27 ENCOUNTER — Encounter: Payer: Self-pay | Admitting: Radiation Oncology

## 2015-10-27 DIAGNOSIS — E119 Type 2 diabetes mellitus without complications: Secondary | ICD-10-CM | POA: Diagnosis not present

## 2015-10-27 DIAGNOSIS — Z9079 Acquired absence of other genital organ(s): Secondary | ICD-10-CM | POA: Diagnosis not present

## 2015-10-27 DIAGNOSIS — Z79899 Other long term (current) drug therapy: Secondary | ICD-10-CM | POA: Diagnosis not present

## 2015-10-27 DIAGNOSIS — Z7982 Long term (current) use of aspirin: Secondary | ICD-10-CM | POA: Diagnosis not present

## 2015-10-27 DIAGNOSIS — C61 Malignant neoplasm of prostate: Secondary | ICD-10-CM | POA: Diagnosis not present

## 2015-10-28 ENCOUNTER — Ambulatory Visit
Admission: RE | Admit: 2015-10-28 | Discharge: 2015-10-28 | Disposition: A | Discharge status: Home / Self Care | Payer: 59 | Source: Ambulatory Visit | Attending: Radiation Oncology | Admitting: Radiation Oncology

## 2015-10-28 ENCOUNTER — Encounter: Payer: Self-pay | Admitting: Radiation Oncology

## 2015-10-28 DIAGNOSIS — E119 Type 2 diabetes mellitus without complications: Secondary | ICD-10-CM | POA: Diagnosis not present

## 2015-10-28 DIAGNOSIS — Z79899 Other long term (current) drug therapy: Secondary | ICD-10-CM | POA: Diagnosis not present

## 2015-10-28 DIAGNOSIS — Z9079 Acquired absence of other genital organ(s): Secondary | ICD-10-CM | POA: Diagnosis not present

## 2015-10-28 DIAGNOSIS — C61 Malignant neoplasm of prostate: Secondary | ICD-10-CM | POA: Diagnosis not present

## 2015-10-28 DIAGNOSIS — Z7982 Long term (current) use of aspirin: Secondary | ICD-10-CM | POA: Diagnosis not present

## 2015-10-29 ENCOUNTER — Ambulatory Visit
Admission: RE | Admit: 2015-10-29 | Discharge: 2015-10-29 | Disposition: A | Discharge status: Home / Self Care | Payer: 59 | Source: Ambulatory Visit | Attending: Radiation Oncology | Admitting: Radiation Oncology

## 2015-10-29 DIAGNOSIS — C61 Malignant neoplasm of prostate: Secondary | ICD-10-CM | POA: Diagnosis not present

## 2015-10-29 DIAGNOSIS — E119 Type 2 diabetes mellitus without complications: Secondary | ICD-10-CM | POA: Diagnosis not present

## 2015-10-29 DIAGNOSIS — Z9079 Acquired absence of other genital organ(s): Secondary | ICD-10-CM | POA: Diagnosis not present

## 2015-10-29 DIAGNOSIS — Z79899 Other long term (current) drug therapy: Secondary | ICD-10-CM | POA: Diagnosis not present

## 2015-10-29 DIAGNOSIS — Z7982 Long term (current) use of aspirin: Secondary | ICD-10-CM | POA: Diagnosis not present

## 2015-10-30 ENCOUNTER — Ambulatory Visit: Payer: 59 | Admitting: Radiation Oncology

## 2015-10-30 ENCOUNTER — Ambulatory Visit: Payer: 59

## 2015-10-30 MED FILL — MUSE 1,000 MCG URETHRAL SUP: 1000 | 30 days supply | Qty: 6 | Fill #0

## 2015-10-31 ENCOUNTER — Ambulatory Visit
Admission: RE | Admit: 2015-10-31 | Discharge: 2015-10-31 | Disposition: A | Discharge status: Home / Self Care | Payer: 59 | Source: Ambulatory Visit | Attending: Radiation Oncology | Admitting: Radiation Oncology

## 2015-10-31 ENCOUNTER — Ambulatory Visit: Admission: RE | Admit: 2015-10-31 | Payer: 59 | Source: Ambulatory Visit

## 2015-10-31 NOTE — Progress Notes (Signed)
0748 patient presented to the clinic for PUT. Physician unavailable at this time. Patient unable to be treated because machine down. Patient without complaints at this time. Patient released. Patient left clinic ambulatory.

## 2015-11-03 ENCOUNTER — Ambulatory Visit
Admission: RE | Admit: 2015-11-03 | Discharge: 2015-11-03 | Disposition: A | Discharge status: Home / Self Care | Payer: 59 | Source: Ambulatory Visit | Attending: Radiation Oncology | Admitting: Radiation Oncology

## 2015-11-03 DIAGNOSIS — Z9079 Acquired absence of other genital organ(s): Secondary | ICD-10-CM | POA: Diagnosis not present

## 2015-11-03 DIAGNOSIS — E119 Type 2 diabetes mellitus without complications: Secondary | ICD-10-CM | POA: Diagnosis not present

## 2015-11-03 DIAGNOSIS — Z7982 Long term (current) use of aspirin: Secondary | ICD-10-CM | POA: Diagnosis not present

## 2015-11-03 DIAGNOSIS — C61 Malignant neoplasm of prostate: Secondary | ICD-10-CM | POA: Diagnosis not present

## 2015-11-03 DIAGNOSIS — Z79899 Other long term (current) drug therapy: Secondary | ICD-10-CM | POA: Diagnosis not present

## 2015-11-03 NOTE — Progress Notes (Addendum)
Per Merrilee Seashore, RT on L4 patient refused PUT visit with Dr. Tammi Klippel today. Patient good in block of five until Wednesday. [name], PA-C informed of these findings.

## 2015-11-04 ENCOUNTER — Ambulatory Visit
Admission: RE | Admit: 2015-11-04 | Discharge: 2015-11-04 | Disposition: A | Discharge status: Home / Self Care | Payer: 59 | Source: Ambulatory Visit | Attending: Radiation Oncology | Admitting: Radiation Oncology

## 2015-11-04 DIAGNOSIS — E119 Type 2 diabetes mellitus without complications: Secondary | ICD-10-CM | POA: Diagnosis not present

## 2015-11-04 DIAGNOSIS — Z79899 Other long term (current) drug therapy: Secondary | ICD-10-CM | POA: Diagnosis not present

## 2015-11-04 DIAGNOSIS — Z7982 Long term (current) use of aspirin: Secondary | ICD-10-CM | POA: Diagnosis not present

## 2015-11-04 DIAGNOSIS — Z9079 Acquired absence of other genital organ(s): Secondary | ICD-10-CM | POA: Diagnosis not present

## 2015-11-04 DIAGNOSIS — C61 Malignant neoplasm of prostate: Secondary | ICD-10-CM | POA: Diagnosis not present

## 2015-11-05 ENCOUNTER — Ambulatory Visit
Admission: RE | Admit: 2015-11-05 | Discharge: 2015-11-05 | Disposition: A | Discharge status: Home / Self Care | Payer: 59 | Source: Ambulatory Visit | Attending: Radiation Oncology | Admitting: Radiation Oncology

## 2015-11-05 DIAGNOSIS — E119 Type 2 diabetes mellitus without complications: Secondary | ICD-10-CM | POA: Diagnosis not present

## 2015-11-05 DIAGNOSIS — C61 Malignant neoplasm of prostate: Secondary | ICD-10-CM | POA: Diagnosis not present

## 2015-11-05 DIAGNOSIS — Z9079 Acquired absence of other genital organ(s): Secondary | ICD-10-CM | POA: Diagnosis not present

## 2015-11-05 DIAGNOSIS — Z79899 Other long term (current) drug therapy: Secondary | ICD-10-CM | POA: Diagnosis not present

## 2015-11-05 DIAGNOSIS — Z7982 Long term (current) use of aspirin: Secondary | ICD-10-CM | POA: Diagnosis not present

## 2015-11-06 ENCOUNTER — Ambulatory Visit
Admission: RE | Admit: 2015-11-06 | Discharge: 2015-11-06 | Disposition: A | Discharge status: Home / Self Care | Payer: 59 | Source: Ambulatory Visit | Attending: Radiation Oncology | Admitting: Radiation Oncology

## 2015-11-06 VITALS — BP 131/78 | HR 75 | Resp 16 | Wt 200.9 lb

## 2015-11-06 DIAGNOSIS — C61 Malignant neoplasm of prostate: Secondary | ICD-10-CM

## 2015-11-06 DIAGNOSIS — E119 Type 2 diabetes mellitus without complications: Secondary | ICD-10-CM | POA: Diagnosis not present

## 2015-11-06 DIAGNOSIS — Z9079 Acquired absence of other genital organ(s): Secondary | ICD-10-CM | POA: Diagnosis not present

## 2015-11-06 DIAGNOSIS — Z79899 Other long term (current) drug therapy: Secondary | ICD-10-CM | POA: Diagnosis not present

## 2015-11-06 DIAGNOSIS — Z7982 Long term (current) use of aspirin: Secondary | ICD-10-CM | POA: Diagnosis not present

## 2015-11-06 NOTE — Progress Notes (Signed)
Weight and vitals stable. Denies pain. Reports nocturia x 4. Denies dysuria or hematuria. Reports urinary frequency. Describes a steady urine stream without difficulty emptying. Denies leakage or incontinence. Denies urinary urgency. Denies diarrhea. Denies fatigue.   BP 131/78 (BP Location: Right Arm, Patient Position: Sitting, Cuff Size: Large)   Pulse 75   Resp 16   Wt 200 lb 14.4 oz (91.1 kg)   SpO2 100%   BMI 30.55 kg/m  Wt Readings from Last 3 Encounters:  11/06/15 200 lb 14.4 oz (91.1 kg)  10/31/15 198 lb 4.8 oz (89.9 kg)  10/23/15 199 lb 3.2 oz (90.4 kg)

## 2015-11-06 NOTE — Progress Notes (Signed)
  Radiation Oncology         [phone] ________________________________  Name: Larry Davis MRN: [id number]  Date: 11/06/2015  DOB: 08/11/1955  Weekly Radiation Therapy Management    ICD-9-CM ICD-10-CM   1. Prostate cancer ([facility]) 185 C61      Current Dose: 63 Gy     Planned Dose:  68.4 Gy  Narrative . . . . . . . . The patient presents for routine under treatment assessment.                                   35/38 fractions completed. Weight and vitals stable. Denies pain, dysuria, hematuria, leakage, incontinence, urinary urgency, diarrhea, fatigue, or rectal irritation/bleeding. Reports nocturia x 2-4. Reports urinary frequency. Describes a steady urine stream without difficulty emptying.                                  Set-up films were reviewed.                                 The chart was checked. Physical Findings. . .  weight is 200 lb 14.4 oz (91.1 kg). His blood pressure is 131/78 and his pulse is 75. His respiration is 16 and oxygen saturation is 100%. . The lungs are clear. The heart has a regular rhythm and rate. The abdomen is soft and nontender with normal bowel sounds Impression . . . . . . . The patient is tolerating radiation. Plan . . . . . . . . . . . . Continue treatment as planned. ________________________________   Blair Promise, PhD, MD  This document serves as a record of services personally performed by Gery Pray, MD. It was created on his behalf by Darcus Austin, a trained medical scribe. The creation of this record is based on the scribe's personal observations and the provider's statements to them. This document has been checked and approved by the attending provider.

## 2015-11-07 ENCOUNTER — Ambulatory Visit: Payer: 59

## 2015-11-07 ENCOUNTER — Ambulatory Visit
Admission: RE | Admit: 2015-11-07 | Discharge: 2015-11-07 | Disposition: A | Discharge status: Home / Self Care | Payer: 59 | Source: Ambulatory Visit | Attending: Radiation Oncology | Admitting: Radiation Oncology

## 2015-11-07 DIAGNOSIS — Z79899 Other long term (current) drug therapy: Secondary | ICD-10-CM | POA: Diagnosis not present

## 2015-11-07 DIAGNOSIS — Z9079 Acquired absence of other genital organ(s): Secondary | ICD-10-CM | POA: Diagnosis not present

## 2015-11-07 DIAGNOSIS — Z7982 Long term (current) use of aspirin: Secondary | ICD-10-CM | POA: Diagnosis not present

## 2015-11-07 DIAGNOSIS — C61 Malignant neoplasm of prostate: Secondary | ICD-10-CM | POA: Diagnosis not present

## 2015-11-07 DIAGNOSIS — E119 Type 2 diabetes mellitus without complications: Secondary | ICD-10-CM | POA: Diagnosis not present

## 2015-11-10 ENCOUNTER — Encounter: Payer: Self-pay | Admitting: Radiation Oncology

## 2015-11-10 ENCOUNTER — Ambulatory Visit: Payer: 59

## 2015-11-10 ENCOUNTER — Ambulatory Visit
Admission: RE | Admit: 2015-11-10 | Discharge: 2015-11-10 | Disposition: A | Discharge status: Home / Self Care | Payer: 59 | Source: Ambulatory Visit | Attending: Radiation Oncology | Admitting: Radiation Oncology

## 2015-11-10 VITALS — BP 126/77 | HR 70 | Temp 98.2°F | Resp 20 | Wt 199.0 lb

## 2015-11-10 DIAGNOSIS — Z7982 Long term (current) use of aspirin: Secondary | ICD-10-CM | POA: Diagnosis not present

## 2015-11-10 DIAGNOSIS — C61 Malignant neoplasm of prostate: Secondary | ICD-10-CM | POA: Diagnosis not present

## 2015-11-10 DIAGNOSIS — Z9079 Acquired absence of other genital organ(s): Secondary | ICD-10-CM | POA: Diagnosis not present

## 2015-11-10 DIAGNOSIS — Z79899 Other long term (current) drug therapy: Secondary | ICD-10-CM | POA: Diagnosis not present

## 2015-11-10 DIAGNOSIS — E119 Type 2 diabetes mellitus without complications: Secondary | ICD-10-CM | POA: Insufficient documentation

## 2015-11-10 NOTE — Progress Notes (Signed)
Weekly rad ts 37/38 completd, nocturia 3-4x, regular bowels, , no dysuria, hematuria, , good stream, main c/o fatigue, 1 month f/u appt given, appetite good 8:32 AM BP 126/77 (BP Location: Right Arm, Patient Position: Sitting, Cuff Size: Normal)   Pulse 70   Temp 98.2 F (36.8 C) (Oral)   Resp 20   Wt 199 lb (90.3 kg)   BMI 30.26 kg/m   Wt Readings from Last 3 Encounters:  11/10/15 199 lb (90.3 kg)  11/06/15 200 lb 14.4 oz (91.1 kg)  10/31/15 198 lb 4.8 oz (89.9 kg)

## 2015-11-10 NOTE — Progress Notes (Signed)
  Radiation Oncology         [phone]   Name: Larry Davis MRN: [id number]   Date: 11/10/2015  DOB: 08/24/1955    Weekly Radiation Therapy Management    ICD-9-CM ICD-10-CM   1. Prostate cancer ([place]) 185 C61     Current Dose: 66.6 Gy  Planned Dose:  68.4 Gy  Narrative The patient presents for final routine under treatment assessment after his penultimate fraction.  The patient has completed 37/38 fractions. He denies dysuria or hematuria. He reports nocturia x3-4, regular bowels, a good appetite, and a good stream. His main complaint is fatigue. A 1 month f/u appt card has been given.  Set-up films were reviewed. The chart was checked.  Physical Findings  weight is 199 lb (90.3 kg). His oral temperature is 98.2 F (36.8 C). His blood pressure is 126/77 and his pulse is 70. His respiration is 20. . Weight essentially stable.  No significant changes. Alert, in no acute distress.   Impression The patient is tolerating radiation.  Plan Continue treatment as planned. The patient is scheduled to follow up on 12/16/15 after he completes radiation.      Sheral Apley Tammi Klippel, M.D.  This document serves as a record of services personally performed by Tyler Pita, MD. It was created on his behalf by Darcus Austin, a trained medical scribe. The creation of this record is based on the scribe's personal observations and the provider's statements to them. This document has been checked and approved by the attending provider.

## 2015-11-11 ENCOUNTER — Ambulatory Visit: Payer: 59

## 2015-11-11 ENCOUNTER — Encounter: Payer: Self-pay | Admitting: Radiation Oncology

## 2015-11-11 ENCOUNTER — Telehealth: Payer: Self-pay | Admitting: Medical Oncology

## 2015-11-11 ENCOUNTER — Ambulatory Visit
Admission: RE | Admit: 2015-11-11 | Discharge: 2015-11-11 | Disposition: A | Discharge status: Home / Self Care | Payer: 59 | Source: Ambulatory Visit | Attending: Radiation Oncology | Admitting: Radiation Oncology

## 2015-11-11 DIAGNOSIS — Z79899 Other long term (current) drug therapy: Secondary | ICD-10-CM | POA: Diagnosis not present

## 2015-11-11 DIAGNOSIS — E119 Type 2 diabetes mellitus without complications: Secondary | ICD-10-CM | POA: Diagnosis not present

## 2015-11-11 DIAGNOSIS — Z7982 Long term (current) use of aspirin: Secondary | ICD-10-CM | POA: Diagnosis not present

## 2015-11-11 DIAGNOSIS — C61 Malignant neoplasm of prostate: Secondary | ICD-10-CM | POA: Diagnosis not present

## 2015-11-11 DIAGNOSIS — Z9079 Acquired absence of other genital organ(s): Secondary | ICD-10-CM | POA: Diagnosis not present

## 2015-11-11 NOTE — Telephone Encounter (Signed)
I called Larry Davis to congratulate him on completing radiation this morning. He will follow up with [name], PA 12/16/15. I asked him to call me with any questions or concerns.

## 2015-11-12 ENCOUNTER — Ambulatory Visit: Payer: 59

## 2015-11-17 ENCOUNTER — Other Ambulatory Visit: Payer: Self-pay | Admitting: Family

## 2015-11-17 MED FILL — ATORVASTATIN 20 MG TABLET: 20 | 90 days supply | Qty: 90 | Fill #0

## 2015-11-17 MED FILL — JANUVIA 100 MG TABLET: 100 | 90 days supply | Qty: 90 | Fill #0

## 2015-11-17 MED FILL — INVOKANA 300 MG TABLET: 300 | 90 days supply | Qty: 90 | Fill #0

## 2015-11-18 ENCOUNTER — Telehealth: Payer: Self-pay | Admitting: Family

## 2015-11-18 MED ORDER — ZOLPIDEM TARTRATE 10 MG PO TABS: 10.0000 mg | ORAL_TABLET | Freq: Every evening | ORAL | 0 refills | Status: DC | PRN

## 2015-11-18 MED FILL — ZOLPIDEM TARTRATE 10 MG TAB: 10 | 30 days supply | Qty: 30 | Fill #0

## 2015-11-18 NOTE — Telephone Encounter (Signed)
Last rf:  09/29/15, #30 Next OV: due 12/11/15 UDS: past due. Can collect at next office visit.  Rx printed and forwarded to PCP for signature.

## 2015-11-18 NOTE — Telephone Encounter (Signed)
Rx faxed to pharmacy.  Please call pt to scheduled routine follow up with Melissa on 12/11/15. Thanks!

## 2015-11-19 NOTE — Telephone Encounter (Signed)
Called pt. He says that he will call our office back to schedule once he has his calendar with him.

## 2015-11-20 NOTE — Progress Notes (Signed)
  Radiation Oncology         [phone] ________________________________  Name: Larry Davis MRN: [id number]  Date: 11/11/2015  DOB: 01/05/1956  End of Treatment Note  Diagnosis:   60 y.o. 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 Image guidance was performed with megavoltage CT studies prior to each fraction. He was immobilized with a body fix lower extremity mold.  Narrative: The patient tolerated radiation treatment relatively well.  He experienced mild fatigue and minor urinary irritation including, nocturia x 3-4.   Plan: The patient has completed radiation treatment. He will return to radiation oncology clinic for routine followup in one month. I advised him to call or return sooner if he has any questions or concerns related to his recovery or treatment. ________________________________  Sheral Apley. Tammi Klippel, M.D.  This document serves as a record of services personally performed by Tyler Pita, MD. It was created on his behalf by Arlyce Harman, a trained medical scribe. The creation of this record is based on the scribe's personal observations and the provider's statements to them. This document has been checked and approved by the attending provider.

## 2015-12-02 ENCOUNTER — Encounter: Payer: Self-pay | Admitting: Family

## 2015-12-02 MED ORDER — EPINEPHRINE 0.3 MG/0.3ML IJ SOAJ: 0.3000 mg | Freq: Once | INTRAMUSCULAR | 1 refills | Status: DC

## 2015-12-02 MED FILL — EPINEPHRINE 0.3 MG AUTO-INJ: 0.3 | 2 days supply | Qty: 2 | Fill #0

## 2015-12-12 DIAGNOSIS — Z760 Encounter for issue of repeat prescription: Secondary | ICD-10-CM | POA: Diagnosis not present

## 2015-12-16 ENCOUNTER — Ambulatory Visit: Admission: RE | Admit: 2015-12-16 | Payer: 59 | Source: Ambulatory Visit | Admitting: Radiation Oncology

## 2015-12-16 ENCOUNTER — Telehealth: Payer: Self-pay | Admitting: *Deleted

## 2015-12-16 NOTE — Telephone Encounter (Signed)
Called Larry Davis since he had not arrived for his 10:15am FU appointment.  He stated that he canceled the appointment and did not want to reschedule at this time.

## 2015-12-20 ENCOUNTER — Emergency Department (HOSPITAL_COMMUNITY)
Admission: EM | Admit: 2015-12-20 | Discharge: 2015-12-20 | Disposition: A | Discharge status: Home / Self Care | Payer: 59 | Attending: Emergency Medicine | Admitting: Emergency Medicine

## 2015-12-20 ENCOUNTER — Encounter (HOSPITAL_COMMUNITY): Payer: Self-pay

## 2015-12-20 DIAGNOSIS — E119 Type 2 diabetes mellitus without complications: Secondary | ICD-10-CM | POA: Diagnosis not present

## 2015-12-20 DIAGNOSIS — Z7982 Long term (current) use of aspirin: Secondary | ICD-10-CM | POA: Insufficient documentation

## 2015-12-20 DIAGNOSIS — Z7984 Long term (current) use of oral hypoglycemic drugs: Secondary | ICD-10-CM | POA: Insufficient documentation

## 2015-12-20 DIAGNOSIS — Z8546 Personal history of malignant neoplasm of prostate: Secondary | ICD-10-CM | POA: Insufficient documentation

## 2015-12-20 DIAGNOSIS — T7840XA Allergy, unspecified, initial encounter: Secondary | ICD-10-CM | POA: Diagnosis not present

## 2015-12-20 MED ORDER — METHYLPREDNISOLONE SODIUM SUCC 125 MG IJ SOLR
125.0000 mg | Freq: Once | INTRAMUSCULAR | Status: AC
  Administered 2015-12-20: 125 mg via INTRAVENOUS
  Filled 2015-12-20: qty 2

## 2015-12-20 MED ORDER — PREDNISONE 20 MG PO TABS: 60.0000 mg | ORAL_TABLET | Freq: Every day | ORAL | 0 refills | Status: AC

## 2015-12-20 MED ORDER — FAMOTIDINE IN NACL 20-0.9 MG/50ML-% IV SOLN
20.0000 mg | Freq: Once | INTRAVENOUS | Status: AC
  Administered 2015-12-20: 20 mg via INTRAVENOUS
  Filled 2015-12-20: qty 50

## 2015-12-20 NOTE — ED Notes (Signed)
Bed: [id number] Expected date:  Expected time:  Means of arrival:  Comments: Hold for allergic reaction

## 2015-12-20 NOTE — ED Provider Notes (Signed)
Medical screening examination/treatment/procedure(s) were performed by non-physician practitioner and as supervising physician I was immediately available for consultation/collaboration.   EKG Interpretation None        Michoel Kunin, MD 12/20/15 [phone]

## 2015-12-20 NOTE — Discharge Instructions (Signed)
RETURN TO THE EMERGENCY DEPARTMENT IF YOU HAVE ANY SHORTNESS OF BREATH, DIFFICULTY SWALLOWING, SWELLING OF LIPS OR TONGUE, OR IF YOU NEED TO USE YOUR EPI-PEN. FOLLOW UP WITH YOUR DOCTOR IF SYMPTOMS PERSIST.

## 2015-12-20 NOTE — ED Provider Notes (Signed)
[name] Provider Note   CSN: [id number] Arrival date & time: 12/20/15  0222     History   Chief Complaint Chief Complaint  Patient presents with  . Allergic Reaction    HPI Larry Davis is a 60 y.o. male.  Patient with a history of Type 2 DM, prostate cancer, HLD presents with allergic reaction beginning suddenly tonight.  He reports he woke this morning at around 2:00 am having intense generalized itching with a rash that started on the lower extremities and progressed to a wide distribution. No SOB, facial swelling, throat tightening. 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 They came to the ED for further management. Currently, his rash is significantly improved and the itching is gone.    The history is provided by the patient and the spouse. No language interpreter was used.  Allergic Reaction  Presenting symptoms: rash   Presenting symptoms: no difficulty swallowing     Past Medical History:  Diagnosis Date  . Allergy   . Diabetes mellitus without complication ([place])   . PONV (postoperative nausea and vomiting)   . Prostate cancer ([place]) 03/2014    Patient Active Problem List   Diagnosis Date Noted  . Right shoulder pain 08/15/2015  . Hyperlipidemia 03/18/2015  . Prostate cancer ([place]) 05/12/2014  . Allergic rhinitis 05/12/2014  . Insomnia 05/12/2014  . Diabetes type 2, controlled ([place]) 05/11/2014    Past Surgical History:  Procedure Laterality Date  . CHOLECYSTECTOMY  2000  . LYMPHADENECTOMY Bilateral 06/14/2014   Procedure: PELVIC LYMPH NODE DISSECTION;  Surgeon: Alexis Frock, MD;  Location: [facility];  Service: Urology;  Laterality: Bilateral;  . ROBOT ASSISTED LAPAROSCOPIC RADICAL PROSTATECTOMY N/A 06/14/2014   Procedure: ROBOTIC ASSISTED LAPAROSCOPIC RADICAL PROSTATECTOMY WITH INDOCYANINE GREEN DYE INJECTION;  Surgeon: Alexis Frock, MD;  Location: [facility];   Service: Urology;  Laterality: N/A;  . SHOULDER SURGERY Right 1990   arthroscopy       Home Medications    Prior to Admission medications   Medication Sig Start Date End Date Taking? Authorizing Provider  aspirin 81 MG tablet Take 1 tablet (81 mg total) by mouth daily. 11/22/14   Debbrah Alar, NP  atorvastatin (LIPITOR) 20 MG tablet TAKE 1 TABLET (20 MG TOTAL) BY MOUTH DAILY. 11/17/15   Debbrah Alar, NP  Blood Glucose Monitoring Suppl (TRUE METRIX METER) W/DEVICE KIT Use to check blood sugar DX E11.9 02/04/15   Debbrah Alar, NP  canagliflozin (INVOKANA) 300 MG TABS tablet Take 1 tablet (300 mg total) by mouth daily before breakfast. 03/18/15   Debbrah Alar, NP  glucose blood test strip Use as instructed to check blood sugar once a day.  DX E11.9 02/04/15   Debbrah Alar, NP  INVOKANA 300 MG TABS tablet TAKE 1 TABLET BY MOUTH DAILY BEFORE BREAKFAST. 11/17/15   Debbrah Alar, NP  JANUVIA 100 MG tablet TAKE 1 TABLET (100 MG TOTAL) BY MOUTH DAILY. 11/17/15   Debbrah Alar, NP  metFORMIN (GLUCOPHAGE) 500 MG tablet  08/14/15   Historical Provider, MD  TRUEPLUS LANCETS 33G MISC Use to check blood sugar once a day. DX E11.9 02/04/15   Debbrah Alar, NP  zolpidem (AMBIEN) 10 MG tablet Take 1 tablet (10 mg total) by mouth at bedtime as needed for sleep. 11/18/15   Debbrah Alar, NP    Family History Family History  Problem Relation Age of Onset  . Heart disease  Mother   . Diabetes Mother   . Arthritis Mother   . Hypertension Mother   . Heart disease Father   . Hypertension Father   . Diabetes Father   . Arthritis Father   . Cancer Maternal Uncle     prostate  . Cancer Maternal Grandmother     cervical    Social History Social History  Substance Use Topics  . Smoking status: Never Smoker  . Smokeless tobacco: Never Used  . Alcohol use No     Allergies   Bee venom and Metformin and related   Review of Systems Review of Systems    Constitutional: Negative for chills and fever.  HENT: Negative for facial swelling, trouble swallowing and voice change.   Respiratory: Negative.  Negative for shortness of breath.   Cardiovascular: Negative.  Negative for palpitations.  Gastrointestinal: Negative.  Negative for nausea and vomiting.  Musculoskeletal: Negative.   Skin: Positive for rash.  Neurological: Negative.      Physical Exam Updated Vital Signs BP 137/75   Pulse 72   Temp 97.6 F (36.4 C) (Oral)   Resp 18   SpO2 92%   Physical Exam  Constitutional: He is oriented to person, place, and time. He appears well-developed and well-nourished.  HENT:  Head: Normocephalic.  Mouth/Throat: Oropharynx is clear and moist.  No facial swelling of lips or tongue.  Neck: Normal range of motion. Neck supple.  Cardiovascular: Normal rate and regular rhythm.   Pulmonary/Chest: Effort normal and breath sounds normal. He has no wheezes. He has no rales.  Abdominal: Soft. Bowel sounds are normal. There is no tenderness. There is no rebound and no guarding.  Musculoskeletal: Normal range of motion.  Neurological: He is alert and oriented to person, place, and time.  Skin: Skin is warm and dry. No rash noted.  Faint red, patchy residual rash on thighs and upper arms c/w hives.  Psychiatric: He has a normal mood and affect.     ED Treatments / Results  Labs (all labs ordered are listed, but only abnormal results are displayed) Labs Reviewed - No data to display  EKG  EKG Interpretation None       Radiology No results found.  Procedures Procedures (including critical care time)  Medications Ordered in ED Medications  methylPREDNISolone sodium succinate (SOLU-MEDROL) 125 mg/2 mL injection 125 mg (125 mg Intravenous Given 12/20/15 0247)  famotidine (PEPCID) IVPB 20 mg premix (0 mg Intravenous Stopped 12/20/15 0400)     Initial Impression / Assessment and Plan / ED Course  I have reviewed the triage vital signs  and the nursing notes.  Pertinent labs & imaging results that were available during my care of the patient were reviewed by me and considered in my medical decision making (see chart for details).  Clinical Course    The patient presents after sudden onset of symptoms c/w acute allergic reaction. He is observed for a 3-hour period and did not develop any new or recurrent symptoms. IV solumedrol and Pepcid were provided.   He is felt stable for discharge home. Return precautions discussed.   Final Clinical Impressions(s) / ED Diagnoses   Final diagnoses:  None   1. Acute allergic reaction  New Prescriptions New Prescriptions   No medications on file     Charlann Lange, Hershal Coria 01/01/16 0105    April Palumbo, MD 01/01/16 2322

## 2015-12-20 NOTE — ED Notes (Signed)
O2 sat 89-90%/O2 applied per [place] as ordered-[facility]PA is aware

## 2015-12-20 NOTE — ED Triage Notes (Signed)
Patient c/o allergic reaction that woke him from his sleep.  Patient was head to toe all red all over.  Patient states that stomach had white patches on it.  Patient denies any new medications or foods.  Family states that had new treatment for fleas recently in the home.

## 2015-12-23 ENCOUNTER — Encounter: Payer: Self-pay | Admitting: Family

## 2015-12-23 ENCOUNTER — Other Ambulatory Visit: Payer: Self-pay | Admitting: Emergency Medicine

## 2015-12-23 MED ORDER — EPINEPHRINE 0.3 MG/0.3ML IJ SOAJ: 0.3000 mg | Freq: Once | INTRAMUSCULAR | 2 refills | Status: DC

## 2015-12-23 MED FILL — EPINEPHRINE 0.3 MG AUTO-INJ: 0.3 | 30 days supply | Qty: 2 | Fill #0

## 2016-01-27 DIAGNOSIS — E119 Type 2 diabetes mellitus without complications: Secondary | ICD-10-CM | POA: Diagnosis not present

## 2016-01-31 ENCOUNTER — Emergency Department (HOSPITAL_COMMUNITY)
Admission: EM | Admit: 2016-01-31 | Discharge: 2016-01-31 | Disposition: A | Discharge status: Home / Self Care | Payer: 59 | Attending: Emergency Medicine | Admitting: Emergency Medicine

## 2016-01-31 DIAGNOSIS — Z8546 Personal history of malignant neoplasm of prostate: Secondary | ICD-10-CM | POA: Diagnosis not present

## 2016-01-31 DIAGNOSIS — I10 Essential (primary) hypertension: Secondary | ICD-10-CM | POA: Insufficient documentation

## 2016-01-31 DIAGNOSIS — R0602 Shortness of breath: Secondary | ICD-10-CM | POA: Diagnosis not present

## 2016-01-31 DIAGNOSIS — Z7984 Long term (current) use of oral hypoglycemic drugs: Secondary | ICD-10-CM | POA: Insufficient documentation

## 2016-01-31 DIAGNOSIS — E119 Type 2 diabetes mellitus without complications: Secondary | ICD-10-CM | POA: Insufficient documentation

## 2016-01-31 DIAGNOSIS — Z7982 Long term (current) use of aspirin: Secondary | ICD-10-CM | POA: Diagnosis not present

## 2016-01-31 DIAGNOSIS — T7840XA Allergy, unspecified, initial encounter: Secondary | ICD-10-CM | POA: Diagnosis not present

## 2016-01-31 DIAGNOSIS — R1310 Dysphagia, unspecified: Secondary | ICD-10-CM | POA: Diagnosis not present

## 2016-01-31 MED ORDER — FAMOTIDINE IN NACL 20-0.9 MG/50ML-% IV SOLN
20.0000 mg | Freq: Once | INTRAVENOUS | Status: AC
  Administered 2016-01-31: 20 mg via INTRAVENOUS
  Filled 2016-01-31: qty 50

## 2016-01-31 MED ORDER — PREDNISONE 10 MG PO TABS: ORAL_TABLET | ORAL | 0 refills | Status: DC

## 2016-01-31 MED ORDER — METHYLPREDNISOLONE SODIUM SUCC 125 MG IJ SOLR
125.0000 mg | Freq: Once | INTRAMUSCULAR | Status: AC
  Administered 2016-01-31: 125 mg via INTRAVENOUS
  Filled 2016-01-31: qty 2

## 2016-01-31 NOTE — ED Provider Notes (Signed)
Patient presented to the ER with vertigo reaction. Patient initially started having itching and then began to feel short of breath and tongue and facial swelling. He administered epinephrine and Benadryl prior to arrival with improvement.  Face to face Exam: HEENT - PERRLA Lungs - CTAB Heart - RRR, no M/R/G Abd - S/NT/ND Neuro - alert, oriented x3  Plan: Monitor for rebound, steroid administration.   Orpah Greek, MD 01/31/16 [phone]

## 2016-01-31 NOTE — ED Provider Notes (Signed)
[facility] DEPT Provider Note   CSN: [id number] Arrival date & time: 01/31/16  0113     History   Chief Complaint Chief Complaint  Patient presents with  . Allergic Reaction    HPI Larry Davis is a 60 y.o. male.  Patient with history of HTN, HLD, DM, prostate CA presents with onset of intense itching, generalized red hive-like rash, facial swelling and SOB. Symptoms woke him from sleep. His wife gave him 75 mg Benadryl and 0.3 Epi via EpiPen. His symptoms improved on the way to the emergency department, now with some residual rash and moderate (down from severe) tongue swelling. No current SOB. He complains of pain in his mid-upper back. No pain with breathing, no cough. No recent new medications, dietary changes, soaps/detergents. He had a similar episode approximately one month ago.    The history is provided by the patient. No language interpreter was used.  Allergic Reaction  Presenting symptoms: difficulty swallowing and rash     Past Medical History:  Diagnosis Date  . Allergy   . Diabetes mellitus without complication ([place])   . PONV (postoperative nausea and vomiting)   . Prostate cancer ([place]) 03/2014    Patient Active Problem List   Diagnosis Date Noted  . Right shoulder pain 08/15/2015  . Hyperlipidemia 03/18/2015  . Prostate cancer ([place]) 05/12/2014  . Allergic rhinitis 05/12/2014  . Insomnia 05/12/2014  . Diabetes type 2, controlled ([place]) 05/11/2014    Past Surgical History:  Procedure Laterality Date  . CHOLECYSTECTOMY  2000  . LYMPHADENECTOMY Bilateral 06/14/2014   Procedure: PELVIC LYMPH NODE DISSECTION;  Surgeon: Alexis Frock, MD;  Location: [facility];  Service: Urology;  Laterality: Bilateral;  . ROBOT ASSISTED LAPAROSCOPIC RADICAL PROSTATECTOMY N/A 06/14/2014   Procedure: ROBOTIC ASSISTED LAPAROSCOPIC RADICAL PROSTATECTOMY WITH INDOCYANINE GREEN DYE INJECTION;  Surgeon: Alexis Frock, MD;  Location: [facility];  Service: Urology;  Laterality: N/A;    . SHOULDER SURGERY Right 1990   arthroscopy       Home Medications    Prior to Admission medications   Medication Sig Start Date End Date Taking? Authorizing Provider  aspirin 81 MG tablet Take 1 tablet (81 mg total) by mouth daily. 11/22/14   Debbrah Alar, NP  atorvastatin (LIPITOR) 20 MG tablet TAKE 1 TABLET (20 MG TOTAL) BY MOUTH DAILY. 11/17/15   Debbrah Alar, NP  Blood Glucose Monitoring Suppl (TRUE METRIX METER) W/DEVICE KIT Use to check blood sugar DX E11.9 02/04/15   Debbrah Alar, NP  canagliflozin (INVOKANA) 300 MG TABS tablet Take 1 tablet (300 mg total) by mouth daily before breakfast. 03/18/15   Debbrah Alar, NP  glucose blood test strip Use as instructed to check blood sugar once a day.  DX E11.9 02/04/15   Debbrah Alar, NP  INVOKANA 300 MG TABS tablet TAKE 1 TABLET BY MOUTH DAILY BEFORE BREAKFAST. 11/17/15   Debbrah Alar, NP  JANUVIA 100 MG tablet TAKE 1 TABLET (100 MG TOTAL) BY MOUTH DAILY. 11/17/15   Debbrah Alar, NP  metFORMIN (GLUCOPHAGE) 500 MG tablet  08/14/15   Historical Provider, MD  TRUEPLUS LANCETS 33G MISC Use to check blood sugar once a day. DX E11.9 02/04/15   Debbrah Alar, NP  zolpidem (AMBIEN) 10 MG tablet Take 1 tablet (10 mg total) by mouth at bedtime as needed for sleep. 11/18/15   Debbrah Alar, NP    Family History Family History  Problem Relation Age of Onset  . Heart disease Mother   . Diabetes Mother   .  Arthritis Mother   . Hypertension Mother   . Heart disease Father   . Hypertension Father   . Diabetes Father   . Arthritis Father   . Cancer Maternal Uncle     prostate  . Cancer Maternal Grandmother     cervical    Social History Social History  Substance Use Topics  . Smoking status: Never Smoker  . Smokeless tobacco: Never Used  . Alcohol use No     Allergies   Bee venom and Metformin and related   Review of Systems Review of Systems  Constitutional: Negative for chills and  fever.  HENT: Positive for facial swelling and trouble swallowing.   Respiratory: Positive for shortness of breath.   Cardiovascular: Negative.  Negative for chest pain.  Gastrointestinal: Negative.  Negative for abdominal pain and vomiting.  Musculoskeletal: Negative.   Skin: Positive for rash.  Neurological: Negative.  Negative for syncope and weakness.     Physical Exam Updated Vital Signs BP 133/77 (BP Location: Right Arm)   Pulse 76   Temp 97.3 F (36.3 C) (Oral)   Resp 18   Ht _0  (1.727 m)   Wt 87.1 kg   SpO2 99%   BMI 29.19 kg/m   Physical Exam  Constitutional: He is oriented to person, place, and time. He appears well-developed and well-nourished.  HENT:  Head: Normocephalic.  Tongue slightly swollen without slurred or affected speech. Oropharynx normal. No other facial swelling.   Neck: Normal range of motion. Neck supple.  Cardiovascular: Normal rate and regular rhythm.   No murmur heard. Pulmonary/Chest: Effort normal and breath sounds normal. He has no wheezes. He has no rales.  Abdominal: Soft. Bowel sounds are normal. There is no tenderness. There is no rebound and no guarding.  Musculoskeletal: Normal range of motion.  Neurological: He is alert and oriented to person, place, and time.  Skin: Skin is warm and dry. No rash noted.  Psychiatric: He has a normal mood and affect.     ED Treatments / Results  Labs (all labs ordered are listed, but only abnormal results are displayed) Labs Reviewed - No data to display  EKG  EKG Interpretation None       Radiology No results found.  Procedures Procedures (including critical care time)  Medications Ordered in ED Medications  methylPREDNISolone sodium succinate (SOLU-MEDROL) 125 mg/2 mL injection 125 mg (125 mg Intravenous Given 01/31/16 0137)  famotidine (PEPCID) IVPB 20 mg premix (0 mg Intravenous Stopped 01/31/16 0230)     Initial Impression / Assessment and Plan / ED Course  I have  reviewed the triage vital signs and the nursing notes.  Pertinent labs & imaging results that were available during my care of the patient were reviewed by me and considered in my medical decision making (see chart for details).  Clinical Course     Patient presents with recurrent, sudden onset symptoms of allergic reaction including lip/tongue swelling with mild dysphagia, SOB, generalized itching and rash. Given Epi at home with benadryl with improvement on arrival to ED, still with mild tongue edema.   The patient was monitored for 3 hours post EpiPen administration. Given IV solumedrol and Pepcid in ED. No worsening or recurrent symptoms. Tongue is improved. He is drinking fluids without difficulty or impaired swallowing.   Discussed with Dr. Betsey Holiday. The patient is considered stable for discharge home.   Final Clinical Impressions(s) / ED Diagnoses   Final diagnoses:  None   1. Acute allergic reaction  New Prescriptions New Prescriptions   No medications on file     Charlann Lange, PA-C 01/31/16 0321    Orpah Greek, MD 01/31/16 [phone]

## 2016-01-31 NOTE — ED Notes (Signed)
Pt dc ambulatory instructed on follow up plan and med use PT NAD AT DC

## 2016-01-31 NOTE — ED Triage Notes (Signed)
Pt reports sudden onset of rash to abdomen and swelling of tongue

## 2016-02-02 ENCOUNTER — Encounter: Payer: Self-pay | Admitting: Family

## 2016-02-02 DIAGNOSIS — T782XXA Anaphylactic shock, unspecified, initial encounter: Secondary | ICD-10-CM

## 2016-02-02 MED ORDER — EPINEPHRINE 0.3 MG/0.3ML IJ SOAJ: 0.3000 mg | Freq: Once | INTRAMUSCULAR | 1 refills | Status: DC

## 2016-02-02 NOTE — Telephone Encounter (Signed)
Please Advise; no appt availability today at [facility] 12/08

## 2016-02-05 ENCOUNTER — Encounter: Payer: Self-pay | Admitting: Family

## 2016-02-10 MED FILL — EPINEPHRINE 0.3 MG AUTO-INJ: 0.3 | 30 days supply | Qty: 2 | Fill #0

## 2016-02-16 LAB — HM DIABETES EYE EXAM

## 2016-02-25 ENCOUNTER — Other Ambulatory Visit: Payer: Self-pay | Admitting: Allergy

## 2016-02-25 ENCOUNTER — Encounter: Payer: Self-pay | Admitting: Allergy and Immunology

## 2016-02-25 ENCOUNTER — Ambulatory Visit (INDEPENDENT_AMBULATORY_CARE_PROVIDER_SITE_OTHER): Payer: BLUE CROSS/BLUE SHIELD | Admitting: Allergy and Immunology

## 2016-02-25 VITALS — BP 120/78 | HR 72 | Temp 98.6°F | Resp 12 | Ht 68.11 in | Wt 193.8 lb

## 2016-02-25 DIAGNOSIS — J3089 Other allergic rhinitis: Secondary | ICD-10-CM | POA: Diagnosis not present

## 2016-02-25 DIAGNOSIS — Z91038 Other insect allergy status: Secondary | ICD-10-CM | POA: Diagnosis not present

## 2016-02-25 DIAGNOSIS — T7800XA Anaphylactic reaction due to unspecified food, initial encounter: Secondary | ICD-10-CM | POA: Diagnosis not present

## 2016-02-25 MED ORDER — AZELASTINE-FLUTICASONE 137-50 MCG/ACT NA SUSP: 1.0000 | Freq: Two times a day (BID) | NASAL | 5 refills | Status: DC

## 2016-02-25 NOTE — Assessment & Plan Note (Addendum)
The history suggests hymenoptera venom hypersensitivity.  Lab order form has been provided for baseline tryptase level and serum specific IgE against hymenoptera venom panel.  Continue to attempt to avoid insects and have access to epinephrine autoinjectors in case of a sting followed by systemic symptoms.

## 2016-02-25 NOTE — Addendum Note (Signed)
Addended by: Orpah Greek D on: 02/25/2016 01:40 PM   Modules accepted: Orders

## 2016-02-25 NOTE — Assessment & Plan Note (Signed)
Aeroallergen avoidance measures have been discussed and provided in written form.  A prescription has been provided for Dymista (azelastine/fluticasone) nasal spray, 1 spray per nostril twice daily as needed. Proper nasal spray technique has been discussed and demonstrated.  I have also recommended nasal saline spray (i.e., Simply Saline) or nasal saline lavage (i.e., NeilMed) as needed prior to medicated nasal sprays.  For thick post nasal drainage, nasal congestion, and/or sinus pressure, add guaifenesin 1200 mg (Mucinex Maximum Strength) plus/minus pseudoephedrine 120 mg  twice daily as needed with adequate hydration as discussed. Pseudoephedrine is only to be used for short-term relief of nasal/sinus congestion. Long-term use is discouraged due to potential side effects.  If allergen avoidance measures and medications fail to adequately relieve symptoms, aeroallergen immunotherapy will be considered.

## 2016-02-25 NOTE — Assessment & Plan Note (Addendum)
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  We will proceed with in vitro lab studies in an attempt to clarify the etiology.  The following labs have been ordered: Baseline serum tryptase, serum specific IgE against galactose-alpha-1,3-galactose panel.   Should symptoms recur, the patient has been asked to keep a journal to record any foods eaten, beverages consumed, medications taken within a 6 hour period prior to the onset of symptoms, as well as record activities being performed, and environmental conditions. For any symptoms concerning for anaphylaxis, epinephrine is to be administered and 911 is to be called immediately.  Continue to have access to epinephrine autoinjectors in case of recurrence of symptoms.

## 2016-02-25 NOTE — Patient Instructions (Addendum)
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  We will proceed with in vitro lab studies in an attempt to clarify the etiology.  The following labs have been ordered: Baseline serum tryptase, serum specific IgE against galactose-alpha-1,3-galactose panel.   Should symptoms recur, the patient has been asked to keep a journal to record any foods eaten, beverages consumed, medications taken within a 6 hour period prior to the onset of symptoms, as well as record activities being performed, and environmental conditions. For any symptoms concerning for anaphylaxis, epinephrine is to be administered and 911 is to be called immediately.  Continue to have access to epinephrine autoinjectors in case of recurrence of symptoms.  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  Lab order form has been provided for baseline tryptase level and serum specific IgE against hymenoptera venom panel.  Continue to attempt to avoid insects and have access to epinephrine autoinjectors in case of a sting followed by systemic symptoms.  Perennial allergic rhinitis Aeroallergen avoidance measures have been discussed and provided in written form.  A prescription has been provided for Dymista (azelastine/fluticasone) nasal spray, 1 spray per nostril twice daily as needed. Proper nasal spray technique has been discussed and demonstrated.  I have also recommended nasal saline spray (i.e., Simply Saline) or nasal saline lavage (i.e., NeilMed) as needed prior to medicated nasal sprays.  For thick post nasal drainage, nasal congestion, and/or sinus pressure, add guaifenesin 1200 mg (Mucinex Maximum Strength) plus/minus pseudoephedrine 120 mg  twice daily as needed with adequate hydration as discussed. Pseudoephedrine is only to be used for short-term relief of nasal/sinus congestion. Long-term use is discouraged due to potential  side effects.  If allergen avoidance measures and medications fail to adequately relieve symptoms, aeroallergen immunotherapy will be considered.   When lab results have returned the patient will be called with further recommendations and follow up instructions.   Control of House Dust Mite Allergen  House dust mites play a major role in allergic asthma and rhinitis.  They occur in environments with high humidity wherever human skin, the food for dust mites is found. High levels have been detected in dust obtained from mattresses, pillows, carpets, upholstered furniture, bed covers, clothes and soft toys.  The principal allergen of the house dust mite is found in its feces.  A gram of dust may contain 1,000 mites and 250,000 fecal particles.  Mite antigen is easily measured in the air during house cleaning activities.    1. Encase mattresses, including the box spring, and pillow, in an air tight cover.  Seal the zipper end of the encased mattresses with wide adhesive tape. 2. Wash the bedding in water of 130 degrees Farenheit weekly.  Avoid cotton comforters/quilts and flannel bedding: the most ideal bed covering is the dacron comforter. 3. Remove all upholstered furniture from the bedroom. 4. Remove carpets, carpet padding, rugs, and non-washable window drapes from the bedroom.  Wash drapes weekly or use plastic window coverings. 5. Remove all non-washable stuffed toys from the bedroom.  Wash stuffed toys weekly. 6. Have the room cleaned frequently with a vacuum cleaner and a damp dust-mop.  The patient should not be in a room which is being cleaned and should wait 1 hour after cleaning before going into the room. 7. Close and seal all heating outlets in the bedroom.  Otherwise, the room will become filled with dust-laden air.  An electric heater can be used to heat  the room. 8. Reduce indoor humidity to less than 50%.  Do not use a humidifier.

## 2016-02-25 NOTE — Progress Notes (Signed)
New Patient Note  RE: Larry Davis MRN: [id number] DOB: 15-Dec-1955 Date of Office Visit: 02/25/2016  Referring provider: Debbrah Alar, NP Primary care provider: Nance Pear., NP  Chief Complaint: Allergic Reaction and Nasal Congestion   History of present illness: Larry Davis is a 61 y.o. male seen today in consultation requested by Debbrah Alar, NP. He is accompanied today by his wife who assists with the history.  In late November 2017, he consumed a hamburger and salad for dinner and woke up at approximately one o'clock a.m. with generalized urticaria and the sensation of throat swelling.  He was given epinephrine and diphenhydramine and taken to the emergency department by his wife where he was treated with famotidine, and Solu-Medrol.  He was discharged with a corticosteroid dose pack.  In late December 2017, he ate beef and a salad for dinner, went to bed, and woke up at midnight with generalized urticaria, angioedema of the lips and eyelids, the sensation of throat tightness, and dyspnea.  His wife administered epinephrine, gave him diphenhydramine, and took him to the emergency department where he was treated in a similar manner to the previous.  He enjoys hunting and being outdoors in the woods and has had multiple tick bites in the past.  In 2008 he was stung by approximately 30-35 yellow jackets and had an anaphylactic reaction requiring treatment in the emergency department with epinephrine.  Larry Davis experiences nasal congestion, rhinorrhea, postnasal drainage, sneezing, and occasional sinus pressure.  No significant seasonal symptom variation has been noted nor have specific environmental triggers been identified.   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  We will proceed with in vitro lab studies in an attempt to clarify the etiology.  The following labs have been  ordered: Baseline serum tryptase, serum specific IgE against galactose-alpha-1,3-galactose panel.   Should symptoms recur, the patient has been asked to keep a journal to record any foods eaten, beverages consumed, medications taken within a 6 hour period prior to the onset of symptoms, as well as record activities being performed, and environmental conditions. For any symptoms concerning for anaphylaxis, epinephrine is to be administered and 911 is to be called immediately.  Continue to have access to epinephrine autoinjectors in case of recurrence of symptoms.  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  Lab order form has been provided for baseline tryptase level and serum specific IgE against hymenoptera venom panel.  Continue to attempt to avoid insects and have access to epinephrine autoinjectors in case of a sting followed by systemic symptoms.  Perennial allergic rhinitis Aeroallergen avoidance measures have been discussed and provided in written form.  A prescription has been provided for Dymista (azelastine/fluticasone) nasal spray, 1 spray per nostril twice daily as needed. Proper nasal spray technique has been discussed and demonstrated.  I have also recommended nasal saline spray (i.e., Simply Saline) or nasal saline lavage (i.e., NeilMed) as needed prior to medicated nasal sprays.  For thick post nasal drainage, nasal congestion, and/or sinus pressure, add guaifenesin 1200 mg (Mucinex Maximum Strength) plus/minus pseudoephedrine 120 mg  twice daily as needed with adequate hydration as discussed. Pseudoephedrine is only to be used for short-term relief of nasal/sinus congestion. Long-term use is discouraged due to potential side effects.  If allergen avoidance measures and medications fail to adequately relieve symptoms, aeroallergen immunotherapy will be considered.   Meds ordered this encounter  Medications  .  Azelastine-Fluticasone  (DYMISTA) 137-50 MCG/ACT SUSP    Sig: Place 1 spray into the nose 2 (two) times daily.    Dispense:  1 Bottle    Refill:  5    Diagnostics: Environmental skin testing: Positive to dust mite antigen Food allergen skin testing: Positive to beef and lamb.    Physical examination: Blood pressure 120/78, pulse 72, temperature 98.6 F (37 C), temperature source Oral, resp. rate 12, height 5' 8.11" (1.73 m), weight 193 lb 12.6 oz (87.9 kg).  General: Alert, interactive, in no acute distress. HEENT: TMs pearly gray, turbinates moderately edematous without discharge, post-pharynx mildly erythematous. Neck: Supple without lymphadenopathy. Lungs: Clear to auscultation without wheezing, rhonchi or rales. CV: Normal S1, S2 without murmurs. Abdomen: Nondistended, nontender. Skin: Warm and dry, without lesions or rashes. Extremities:  No clubbing, cyanosis or edema. Neuro:   Grossly intact.  Review of systems:  Review of systems negative except as noted in HPI / PMHx or noted below: Review of Systems  Constitutional: Negative.   HENT: Negative.   Eyes: Negative.   Respiratory: Negative.   Cardiovascular: Negative.   Gastrointestinal: Negative.   Genitourinary: Negative.   Musculoskeletal: Negative.   Skin: Negative.   Neurological: Negative.   Endo/Heme/Allergies: Negative.   Psychiatric/Behavioral: Negative.     Past medical history:  Past Medical History:  Diagnosis Date  . Allergy   . Diabetes mellitus without complication ([place])   . PONV (postoperative nausea and vomiting)   . Prostate cancer ([place]) 03/2014  . Urticaria     Past surgical history:  Past Surgical History:  Procedure Laterality Date  . ADENOIDECTOMY    . CHOLECYSTECTOMY  2000  . LYMPHADENECTOMY Bilateral 06/14/2014   Procedure: PELVIC LYMPH NODE DISSECTION;  Surgeon: Alexis Frock, MD;  Location: [facility];  Service: Urology;  Laterality: Bilateral;  . ROBOT ASSISTED LAPAROSCOPIC RADICAL PROSTATECTOMY N/A  06/14/2014   Procedure: ROBOTIC ASSISTED LAPAROSCOPIC RADICAL PROSTATECTOMY WITH INDOCYANINE GREEN DYE INJECTION;  Surgeon: Alexis Frock, MD;  Location: [facility];  Service: Urology;  Laterality: N/A;  . SHOULDER SURGERY Right 1990   arthroscopy  . SINOSCOPY    . TONSILLECTOMY      Family history: Family History  Problem Relation Age of Onset  . Heart disease Mother   . Diabetes Mother   . Arthritis Mother   . Hypertension Mother   . Heart disease Father   . Hypertension Father   . Diabetes Father   . Arthritis Father   . Cancer Maternal Uncle     prostate  . Cancer Maternal Grandmother     cervical    Social history: Social History   Social History  . Marital status: Married    Spouse name: N/A  . Number of children: N/A  . Years of education: N/A   Occupational History  . Not on file.   Social History Main Topics  . Smoking status: Former Research scientist (life sciences)  . Smokeless tobacco: Never Used  . Alcohol use No  . Drug use: No  . Sexual activity: Not on file   Other Topics Concern  . Not on file   Social History Narrative   2 children Son and daughter 3 biological grandchildren- live in [place]   Wife has 4 children   Married   Works as an Chief Financial Officer in Teacher, music   Enjoys Microbiologist History: Patient lives in a house with [employer] throughout, carpeting in the bedroom, [place],  and central air.  There is a dog in house which has access to his bedroom.  He is a nonsmoker.  Allergies as of 02/25/2016      Reactions   Bee Venom Anaphylaxis   Throat swells   Metformin And Related    diarrhea      Medication List       Accurate as of 02/25/16  1:24 PM. Always use your most recent med list.          aspirin 81 MG tablet Take 1 tablet (81 mg total) by mouth daily.   atorvastatin 20 MG tablet Commonly known as:  LIPITOR TAKE 1 TABLET (20 MG TOTAL) BY MOUTH DAILY.   Azelastine-Fluticasone 137-50 MCG/ACT  Susp Commonly known as:  DYMISTA Place 1 spray into the nose 2 (two) times daily.   canagliflozin 300 MG Tabs tablet Commonly known as:  INVOKANA Take 1 tablet (300 mg total) by mouth daily before breakfast.   EPINEPHrine 0.3 mg/0.3 mL Soaj injection Commonly known as:  EPI-PEN   glucose blood test strip Use as instructed to check blood sugar once a day.  DX E11.9   JANUVIA 100 MG tablet Generic drug:  sitaGLIPtin TAKE 1 TABLET (100 MG TOTAL) BY MOUTH DAILY.   TRUE METRIX METER w/Device Kit Use to check blood sugar DX E11.9   TRUEPLUS LANCETS 33G Misc Use to check blood sugar once a day. DX E11.9   zolpidem 10 MG tablet Commonly known as:  AMBIEN Take 1 tablet (10 mg total) by mouth at bedtime as needed for sleep.       Known medication allergies: Allergies  Allergen Reactions  . Bee Venom Anaphylaxis    Throat swells  . Metformin And Related     diarrhea    I appreciate the opportunity to take part in Larry Davis's care. Please do not hesitate to contact me with questions.  Sincerely,   R. Edgar Frisk, MD

## 2016-02-26 MED ORDER — AZELASTINE HCL 0.1 % NA SOLN: 1.0000 | Freq: Two times a day (BID) | NASAL | 5 refills | Status: DC

## 2016-02-26 MED FILL — AZELASTINE 0.1% (137 MCG) S: 0.1 | 50 days supply | Qty: 30 | Fill #0

## 2016-02-26 NOTE — Addendum Note (Signed)
Addended by: Golda Acre C on: 02/26/2016 08:40 AM   Modules accepted: Orders

## 2016-03-04 LAB — ALPHA-GAL PANEL
Alpha Gal IgE*: 21.3 kU/L — ABNORMAL HIGH (ref <0.35)
Beef (Bos spp) IgE: 4.66 kU/L — ABNORMAL HIGH (ref <0.35)
Class Interpretation: 1
Class Interpretation: 2
Class Interpretation: 3
Lamb/Mutton (Ovis spp) IgE: 0.58 kU/L — ABNORMAL HIGH (ref <0.35)
Pork (Sus spp) IgE: 2.28 kU/L — ABNORMAL HIGH (ref <0.35)

## 2016-03-04 LAB — HYMENOPTERA PROFILE 2
Bumblebee: <0.1 kU/L
Honeybee IgE: 0.31 kU/L — AB
Hornet, White Face, IgE: 0.61 kU/L — AB
Hornet, Yellow, IgE: 0.14 kU/L — AB
Paper Wasp IgE: 1.56 kU/L — AB
Yellow Jacket, IgE: 1.03 kU/L — AB

## 2016-03-04 LAB — TRYPTASE: Tryptase: 4.8 ug/L (ref 2.2–13.2)

## 2016-03-08 MED ORDER — EPINEPHRINE 0.3 MG/0.3ML IJ SOAJ
0.3000 mg | Freq: Once | INTRAMUSCULAR | 1 refills | Status: DC
Start: 2016-03-08 — End: 2016-11-19

## 2016-03-08 MED FILL — EPINEPHRINE 0.3 MG AUTO-INJ: 0.3 | 2 days supply | Qty: 2 | Fill #0

## 2016-03-08 NOTE — Addendum Note (Signed)
Addended by: Katherina Right D on: 03/08/2016 03:16 PM   Modules accepted: Orders

## 2016-03-09 DIAGNOSIS — C61 Malignant neoplasm of prostate: Secondary | ICD-10-CM | POA: Diagnosis not present

## 2016-03-09 DIAGNOSIS — E291 Testicular hypofunction: Secondary | ICD-10-CM | POA: Diagnosis not present

## 2016-03-09 DIAGNOSIS — N393 Stress incontinence (female) (male): Secondary | ICD-10-CM | POA: Diagnosis not present

## 2016-03-09 DIAGNOSIS — N5201 Erectile dysfunction due to arterial insufficiency: Secondary | ICD-10-CM | POA: Diagnosis not present

## 2016-03-10 ENCOUNTER — Other Ambulatory Visit: Payer: Self-pay | Admitting: Family

## 2016-03-10 MED FILL — JANUVIA 100 MG TABLET: 100 | 90 days supply | Qty: 90 | Fill #1

## 2016-03-10 MED FILL — INVOKANA 300 MG TABLET: 300 | 90 days supply | Qty: 90 | Fill #0

## 2016-03-10 MED FILL — ATORVASTATIN 20 MG TABLET: 20 | 90 days supply | Qty: 90 | Fill #1

## 2016-03-16 ENCOUNTER — Ambulatory Visit (INDEPENDENT_AMBULATORY_CARE_PROVIDER_SITE_OTHER): Payer: BLUE CROSS/BLUE SHIELD | Admitting: Family

## 2016-03-16 ENCOUNTER — Encounter: Payer: Self-pay | Admitting: Family

## 2016-03-16 VITALS — BP 121/70 | HR 72 | Temp 98.5°F | Ht 68.0 in | Wt 196.0 lb

## 2016-03-16 DIAGNOSIS — Z23 Encounter for immunization: Secondary | ICD-10-CM | POA: Diagnosis not present

## 2016-03-16 DIAGNOSIS — E119 Type 2 diabetes mellitus without complications: Secondary | ICD-10-CM

## 2016-03-16 DIAGNOSIS — T7800XA Anaphylactic reaction due to unspecified food, initial encounter: Secondary | ICD-10-CM | POA: Diagnosis not present

## 2016-03-16 DIAGNOSIS — E785 Hyperlipidemia, unspecified: Secondary | ICD-10-CM

## 2016-03-16 DIAGNOSIS — E118 Type 2 diabetes mellitus with unspecified complications: Secondary | ICD-10-CM

## 2016-03-16 LAB — BASIC METABOLIC PANEL
BUN: 15 mg/dL (ref 6–23)
CHLORIDE: 105 meq/L (ref 96–112)
CO2: 26 mEq/L (ref 19–32)
Calcium: 9.6 mg/dL (ref 8.4–10.5)
Creatinine, Ser: 0.93 mg/dL (ref 0.40–1.50)
GFR: 88.1 mL/min (ref >60.00)
Glucose, Bld: 200 mg/dL — ABNORMAL HIGH (ref 70–99)
POTASSIUM: 4.1 meq/L (ref 3.5–5.1)
SODIUM: 137 meq/L (ref 135–145)

## 2016-03-16 LAB — LIPID PANEL
CHOLESTEROL: 123 mg/dL (ref 0–200)
HDL: 34.5 mg/dL — ABNORMAL LOW (ref >39.00)
LDL CALC: 67 mg/dL (ref 0–99)
NonHDL: 88.06
TRIGLYCERIDES: 103 mg/dL (ref 0.0–149.0)
Total CHOL/HDL Ratio: 4
VLDL: 20.6 mg/dL (ref 0.0–40.0)

## 2016-03-16 LAB — HEMOGLOBIN A1C: Hgb A1c MFr Bld: 8.3 % — ABNORMAL HIGH (ref 4.6–6.5)

## 2016-03-16 NOTE — Assessment & Plan Note (Signed)
Stable sugars at home. Continue current meds. Obtain A1C and bmet.

## 2016-03-16 NOTE — Assessment & Plan Note (Signed)
Tolerating statin. Obtain follow up lipid panel.  

## 2016-03-16 NOTE — Progress Notes (Signed)
Pre visit review using our clinic review tool, if applicable. No additional management support is needed unless otherwise documented below in the visit note. 

## 2016-03-16 NOTE — Patient Instructions (Signed)
Please complete lab work prior to leaving.   

## 2016-03-16 NOTE — Assessment & Plan Note (Signed)
Discussed avoidance of trigger foods, keep benadryl and epipen with him at all times.

## 2016-03-16 NOTE — Progress Notes (Signed)
Subjective:    Patient ID: Larry Davis, male    DOB: 05-06-1955, 61 y.o.   MRN: [id number]  HPI  Larry Davis is a 61 year old male who presents today for follow up.  1) DM2- maintained on januvia, invokana. Reports that his sugars are ranging 150-160.  Lab Results  Component Value Date   HGBA1C 7.3 (H) 08/11/2015   HGBA1C 7.3 (H) 03/11/2015   HGBA1C 8.7 (H) 11/13/2014   Lab Results  Component Value Date   MICROALBUR <0.7 08/11/2015   LDLCALC 54 03/18/2015   CREATININE 1.02 08/11/2015   2) Hyperlipidemia- maintained on atorvastatin.  Lab Results  Component Value Date   CHOL 108 03/18/2015   HDL 39.10 03/18/2015   LDLCALC 54 03/18/2015   LDLDIRECT 142.0 11/13/2014   TRIG 76.0 03/18/2015   CHOLHDL 3 03/18/2015   3) Food allergy- patient developed angioedema. Unable to eat beef or pork.     Review of Systems    see HPI  Past Medical History:  Diagnosis Date  . Allergy   . Diabetes mellitus without complication ([place])   . PONV (postoperative nausea and vomiting)   . Prostate cancer ([place]) 03/2014  . Urticaria      Social History   Social History  . Marital status: Married    Spouse name: N/A  . Number of children: N/A  . Years of education: N/A   Occupational History  . Not on file.   Social History Main Topics  . Smoking status: Former Research scientist (life sciences)  . Smokeless tobacco: Never Used  . Alcohol use No  . Drug use: No  . Sexual activity: Not on file   Other Topics Concern  . 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 ADENOIDECTOMY    . CHOLECYSTECTOMY  2000  . LYMPHADENECTOMY Bilateral 06/14/2014   Procedure: PELVIC LYMPH NODE DISSECTION;  Surgeon: Alexis Frock, MD;  Location: [facility];   Service: Urology;  Laterality: Bilateral;  . ROBOT ASSISTED LAPAROSCOPIC RADICAL PROSTATECTOMY N/A 06/14/2014   Procedure: ROBOTIC ASSISTED LAPAROSCOPIC RADICAL PROSTATECTOMY WITH INDOCYANINE GREEN DYE INJECTION;  Surgeon: Alexis Frock, MD;  Location: [facility];  Service: Urology;  Laterality: N/A;  . SHOULDER SURGERY Right 1990   arthroscopy  . SINOSCOPY    . TONSILLECTOMY      Family History  Problem Relation Age of Onset  . Heart disease Mother   . Diabetes Mother   . Arthritis Mother   . Hypertension Mother   . Heart disease Father   . Hypertension Father   . Diabetes Father   . Arthritis Father   . Cancer Maternal Uncle     prostate  . Cancer Maternal Grandmother     cervical    Allergies  Allergen Reactions  . Bee Venom Anaphylaxis    Throat swells  . Beef-Derived Products Anaphylaxis  . Metformin And Related     diarrhea    Current Outpatient Prescriptions on File Prior to Visit  Medication Sig Dispense Refill  . aspirin 81 MG tablet Take 1 tablet (81 mg total) by mouth daily. (Patient not taking: Reported on 02/25/2016) 30 tablet   . atorvastatin (LIPITOR) 20 MG tablet  TAKE 1 TABLET (20 MG TOTAL) BY MOUTH DAILY. 90 tablet 1  . azelastine (ASTELIN) 0.1 % nasal spray Place 1 spray into both nostrils 2 (two) times daily. Use in each nostril as directed 30 mL 5  . Azelastine-Fluticasone (DYMISTA) 137-50 MCG/ACT SUSP Place 1 spray into the nose 2 (two) times daily. 1 Bottle 5  . Blood Glucose Monitoring Suppl (TRUE METRIX METER) W/DEVICE KIT Use to check blood sugar DX E11.9 1 kit 0  . glucose blood test strip Use as instructed to check blood sugar once a day.  DX E11.9 100 each 1  . INVOKANA 300 MG TABS tablet TAKE 1 TABLET BY MOUTH DAILY BEFORE BREAKFAST. 90 tablet 0  . JANUVIA 100 MG tablet TAKE 1 TABLET (100 MG TOTAL) BY MOUTH DAILY. 90 tablet 1  . TRUEPLUS LANCETS 33G MISC Use to check blood sugar once a day. DX E11.9 100 each 1  . zolpidem (AMBIEN) 10 MG tablet Take  1 tablet (10 mg total) by mouth at bedtime as needed for sleep. 30 tablet 0   No current facility-administered medications on file prior to visit.     BP 121/70 (BP Location: Right Arm, Cuff Size: Large)   Pulse 72   Temp 98.5 F (36.9 C)   Ht '5\' 8"'  (1.727 m)   Wt 196 lb (88.9 kg)   SpO2 98%   BMI 29.80 kg/m    Objective:   Physical Exam  Constitutional: He is oriented to person, place, and time. He appears well-developed and well-nourished. No distress.  HENT:  Head: Normocephalic and atraumatic.  Cardiovascular: Normal rate and regular rhythm.   No murmur heard. Pulmonary/Chest: Effort normal and breath sounds normal. No respiratory distress. He has no wheezes. He has no rales.  Musculoskeletal: He exhibits no edema.  Neurological: He is alert and oriented to person, place, and time.  Skin: Skin is warm and dry.  Psychiatric: He has a normal mood and affect. His behavior is normal. Thought content normal.          Assessment & Plan:  Flu shot today

## 2016-03-18 ENCOUNTER — Telehealth: Payer: Self-pay | Admitting: Family

## 2016-03-18 MED ORDER — PIOGLITAZONE HCL 30 MG PO TABS: 30.0000 mg | ORAL_TABLET | Freq: Every day | ORAL | 3 refills | Status: DC

## 2016-03-18 MED FILL — PIOGLITAZONE HCL 30 MG TAB: 30 | 30 days supply | Qty: 30 | Fill #0

## 2016-03-18 NOTE — Telephone Encounter (Signed)
A1C is above goal. Add actos once daily.  Needs to work really hard on diet/exerise/weight loss.  If not much improved next visit we may need to add in insulin to get him to goal since we are running out of other options.

## 2016-03-19 NOTE — Telephone Encounter (Signed)
Notified pt. Pt states he is uncertain about starting actos as he read the paper and it notes increased risk of cancer in some pts. Pt states he is a cancer survivor and doesn't think he wants to try this med. Advised pt per verbal from PCP that she will want to start him on lantus insulin as our last option. Pt states he will have to think about this and let us know how he wants to proceed.

## 2016-03-19 NOTE — Telephone Encounter (Signed)
Noted, will await pt call back.

## 2016-03-22 ENCOUNTER — Encounter: Payer: Self-pay | Admitting: Family

## 2016-03-22 DIAGNOSIS — E119 Type 2 diabetes mellitus without complications: Secondary | ICD-10-CM

## 2016-04-16 ENCOUNTER — Other Ambulatory Visit: Payer: Self-pay | Admitting: Family

## 2016-04-19 MED ORDER — ZOLPIDEM TARTRATE 10 MG PO TABS
10.0000 mg | ORAL_TABLET | Freq: Every evening | ORAL | 0 refills | Status: DC | PRN
Start: 2016-04-19 — End: 2016-09-14

## 2016-04-19 MED FILL — ZOLPIDEM TARTRATE 10 MG TAB: 10 | 30 days supply | Qty: 30 | Fill #0

## 2016-04-19 NOTE — Telephone Encounter (Signed)
Requesting:   zolpidem Contract   05/06/2014 UDS   Moderate risk was due 08/06/2014 Last OV   03/16/2016--no upcoming appt scheduled. Last Refill   #30 with 0 refills on 11/18/2015  Please Advise

## 2016-04-19 NOTE — Telephone Encounter (Signed)
Faxed hardcopy for zolpidem to the [facility].

## 2016-04-20 ENCOUNTER — Encounter: Payer: Self-pay | Admitting: Endocrinology

## 2016-06-07 ENCOUNTER — Other Ambulatory Visit: Payer: Self-pay | Admitting: Family

## 2016-06-07 ENCOUNTER — Encounter: Payer: Self-pay | Admitting: Internal Medicine

## 2016-06-07 ENCOUNTER — Encounter: Payer: Self-pay | Admitting: Family

## 2016-06-07 ENCOUNTER — Ambulatory Visit (INDEPENDENT_AMBULATORY_CARE_PROVIDER_SITE_OTHER): Payer: BLUE CROSS/BLUE SHIELD | Admitting: Internal Medicine

## 2016-06-07 VITALS — BP 134/84 | HR 80 | Ht 68.0 in | Wt 195.0 lb

## 2016-06-07 DIAGNOSIS — E1165 Type 2 diabetes mellitus with hyperglycemia: Secondary | ICD-10-CM

## 2016-06-07 LAB — POCT GLYCOSYLATED HEMOGLOBIN (HGB A1C): Hemoglobin A1C: 7.9

## 2016-06-07 MED ORDER — TRUE METRIX METER W/DEVICE KIT: PACK | 0 refills | Status: DC

## 2016-06-07 MED ORDER — TRUEPLUS LANCETS 33G MISC: 1 refills | Status: DC

## 2016-06-07 MED ORDER — GLUCOSE BLOOD VI STRP: ORAL_STRIP | 1 refills | Status: DC

## 2016-06-07 MED FILL — MICROLET LANCETS: 90 days supply | Qty: 100 | Fill #0

## 2016-06-07 MED FILL — CONTOUR NEXT STRIPS: 50 days supply | Qty: 50 | Fill #0

## 2016-06-07 MED FILL — CONTOUR NEXT METER: W/DEVICE | 30 days supply | Qty: 1 | Fill #0

## 2016-06-07 NOTE — Addendum Note (Signed)
Addended by: Caprice Beaver T on: 06/07/2016 02:28 PM   Modules accepted: Orders

## 2016-06-07 NOTE — Patient Instructions (Signed)
Please continue: - Invokana 300 mg daily in am - Januvia 100 mg daily in am  Please change your diet as we discussed.  Please return in 3 months with your sugar log.   PATIENT INSTRUCTIONS FOR TYPE 2 DIABETES:  DIET AND EXERCISE Diet and exercise is an important part of diabetic treatment.  We recommended aerobic exercise in the form of brisk walking (working between 40-60% of maximal aerobic capacity, similar to brisk walking) for 150 minutes per week (such as 30 minutes five days per week) along with 3 times per week performing 'resistance' training (using various gauge rubber tubes with handles) 5-10 exercises involving the major muscle groups (upper body, lower body and core) performing 10-15 repetitions (or near fatigue) each exercise. Start at half the above goal but build slowly to reach the above goals. If limited by weight, joint pain, or disability, we recommend daily walking in a swimming pool with water up to waist to reduce pressure from joints while allow for adequate exercise.    BLOOD GLUCOSES Monitoring your blood glucoses is important for continued management of your diabetes. Please check your blood glucoses 2-4 times a day: fasting, before meals and at bedtime (you can rotate these measurements - e.g. one day check before the 3 meals, the next day check before 2 of the meals and before bedtime, etc.).   HYPOGLYCEMIA (low blood sugar) Hypoglycemia is usually a reaction to not eating, exercising, or taking too much insulin/ other diabetes drugs.  Symptoms include tremors, sweating, hunger, confusion, headache, etc. Treat IMMEDIATELY with 15 grams of Carbs: . 4 glucose tablets .  cup regular juice/soda . 2 tablespoons raisins . 4 teaspoons sugar . 1 tablespoon honey Recheck blood glucose in 15 mins and repeat above if still symptomatic/blood glucose <100.  RECOMMENDATIONS TO REDUCE YOUR RISK OF DIABETIC COMPLICATIONS: * Take your prescribed MEDICATION(S) * Follow a  DIABETIC diet: Complex carbs, fiber rich foods, (monounsaturated and polyunsaturated) fats * AVOID saturated/trans fats, high fat foods, >2,300 mg salt per day. * EXERCISE at least 5 times a week for 30 minutes or preferably daily.  * DO NOT SMOKE OR DRINK more than 1 drink a day. * Check your FEET every day. Do not wear tightfitting shoes. Contact us if you develop an ulcer * See your EYE doctor once a year or more if needed * Get a FLU shot once a year * Get a PNEUMONIA vaccine once before and once after age 18 years  GOALS:  * Your Hemoglobin A1c of <7%  * fasting sugars need to be <130 * after meals sugars need to be <180 (2h after you start eating) * Your Systolic BP should be 329 or lower  * Your Diastolic BP should be 80 or lower  * Your HDL (Good Cholesterol) should be 40 or higher  * Your LDL (Bad Cholesterol) should be 100 or lower. * Your Triglycerides should be 150 or lower  * Your Urine microalbumin (kidney function) should be <30 * Your Body Mass Index should be 25 or lower    Please consider the following ways to cut down carbs and fat and increase fiber and micronutrients in your diet: - substitute whole grain for white bread or pasta - substitute brown rice for white rice - substitute 90-calorie flat bread pieces for slices of bread when possible - substitute sweet potatoes or yams for white potatoes - substitute humus for margarine - substitute tofu for cheese when possible - substitute almond or rice  milk for regular milk (would not drink soy milk daily due to concern for soy estrogen influence on breast cancer risk) - substitute dark chocolate for other sweets when possible - substitute water - can add lemon or orange slices for taste - for diet sodas (artificial sweeteners will trick your body that you can eat sweets without getting calories and will lead you to overeating and weight gain in the long run) - do not skip breakfast or other meals (this will slow down  the metabolism and will result in more weight gain over time)  - can try smoothies made from fruit and almond/rice milk in am instead of regular breakfast - can also try old-fashioned (not instant) oatmeal made with almond/rice milk in am - order the dressing on the side when eating salad at a restaurant (pour less than half of the dressing on the salad) - eat as little meat as possible - can try juicing, but should not forget that juicing will get rid of the fiber, so would alternate with eating raw veg./fruits or drinking smoothies - use as little oil as possible, even when using olive oil - can dress a salad with a mix of balsamic vinegar and lemon juice, for e.g. - use agave nectar, stevia sugar, or regular sugar rather than artificial sweateners - steam or broil/roast veggies  - snack on veggies/fruit/nuts (unsalted, preferably) when possible, rather than processed foods - reduce or eliminate aspartame in diet (it is in diet sodas, chewing gum, etc) Read the labels!  Try to read Dr. Janene Harvey book: "Program for Reversing Diabetes" for other ideas for healthy eating.

## 2016-06-07 NOTE — Progress Notes (Signed)
Patient ID: Larry Davis, male   DOB: September 04, 1955, 61 y.o.   MRN: [id number]   HPI: Larry Davis is a 61 y.o.-year-old male, referred by his PCP, O'SULLIVAN,MELISSA S., NP, for management of DM2, dx in ~2008, non-insulin-dependent, uncontrolled, without long term complications.  Last hemoglobin A1c was: Lab Results  Component Value Date   HGBA1C 8.3 (H) 03/16/2016   HGBA1C 7.3 (H) 08/11/2015   HGBA1C 7.3 (H) 03/11/2015   Since last HbA1c >> his diet is better and exercises more  Pt is on a regimen of: - Januvia 100 mg daily in am - Invokana 300 mg daily in am He tried Metformin R and ER >> diarrhea  Pt checks his sugars 1x a week and they are: - am: 160-180 - 2h after b'fast: n/c - before lunch: n/c - 2h after lunch: n/c - before dinner: n/c - 2h after dinner: n/c - bedtime: n/c - nighttime: n/c No lows. Lowest sugar was 140; he has hypoglycemia awareness at 110.  Highest sugar was 185.  Glucometer: AccuChek  Pt's meals are: 45-60 g carbs per meal, 50-60% vegetarian - Breakfast: coffee + oatmeal or bagel, fruit - Lunch: salad + sandwich or leftovers from dinner  - Dinner: chicken and fish + vegetables + starch - Snacks: pretzels at night; frozen custard He saw nutrition in the past. No fast food. Diet sodas - not often. Unsweet tea.  He has a fitbit >> 10,000-12,000 steps a day.  - no CKD, last BUN/creatinine:  Lab Results  Component Value Date   BUN 15 03/16/2016   BUN 18 08/11/2015   CREATININE 0.93 03/16/2016   CREATININE 1.02 08/11/2015   - last set of lipids: Lab Results  Component Value Date   CHOL 123 03/16/2016   HDL 34.50 (L) 03/16/2016   LDLCALC 67 03/16/2016   LDLDIRECT 142.0 11/13/2014   TRIG 103.0 03/16/2016   CHOLHDL 4 03/16/2016  On Atorvastatin. - last eye exam was in 01/2016 [facility]). No DR.  - no numbness and tingling in his feet.  Pt has FH of DM in M and F.  ROS: Constitutional: no weight gain/loss, no fatigue, no subjective  hyperthermia/hypothermia Eyes: no blurry vision, no xerophthalmia ENT: no sore throat, no nodules palpated in throat, no dysphagia/odynophagia, no hoarseness Cardiovascular: no CP/SOB/palpitations/leg swelling Respiratory: + cough/no SOB Gastrointestinal: no N/V/D/C Musculoskeletal: no muscle/joint aches Skin: no rashes Neurological: no tremors/numbness/tingling/dizziness Psychiatric: no depression/anxiety + low libido, diff. With erections  Past Medical History:  Diagnosis Date  . Allergy   . Diabetes mellitus without complication ([place])   . PONV (postoperative nausea and vomiting)   . Prostate cancer ([place]) 03/2014  . Urticaria    Past Surgical History:  Procedure Laterality Date  . ADENOIDECTOMY    . CHOLECYSTECTOMY  2000  . LYMPHADENECTOMY Bilateral 06/14/2014   Procedure: PELVIC LYMPH NODE DISSECTION;  Surgeon: Alexis Frock, MD;  Location: [facility];  Service: Urology;  Laterality: Bilateral;  . ROBOT ASSISTED LAPAROSCOPIC RADICAL PROSTATECTOMY N/A 06/14/2014   Procedure: ROBOTIC ASSISTED LAPAROSCOPIC RADICAL PROSTATECTOMY WITH INDOCYANINE GREEN DYE INJECTION;  Surgeon: Alexis Frock, MD;  Location: [facility];  Service: Urology;  Laterality: N/A;  . SHOULDER SURGERY Right 1990   arthroscopy  . SINOSCOPY    . TONSILLECTOMY     Social History   Social History  . Marital status: Married    Spouse name: N/A   Social History Main Topics  . Smoking status: Former Research scientist (life sciences)  . Smokeless tobacco: Never Used  .  Alcohol use No  . Drug use: No  . Sexual activity: Not on file   Other Topics Concern  . 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 atorvastatin  (LIPITOR) 20 MG tablet TAKE 1 TABLET (20 MG TOTAL) BY MOUTH DAILY. 90 tablet 1  . Azelastine-Fluticasone (DYMISTA) 137-50 MCG/ACT SUSP Place 1 spray into the nose 2 (two) times daily. 1 Bottle 5  . Blood Glucose Monitoring Suppl (TRUE METRIX METER) W/DEVICE KIT Use to check blood sugar DX E11.9 (Patient taking differently: Use to check blood sugar DX E11.9) 1 kit 0  . glucose blood test strip Use as instructed to check blood sugar once a day.  DX E11.9 100 each 1  . INVOKANA 300 MG TABS tablet TAKE 1 TABLET BY MOUTH DAILY BEFORE BREAKFAST. 90 tablet 0  . JANUVIA 100 MG tablet TAKE 1 TABLET (100 MG TOTAL) BY MOUTH DAILY. 90 tablet 1  . TRUEPLUS LANCETS 33G MISC Use to check blood sugar once a day. DX E11.9 100 each 1  . zolpidem (AMBIEN) 10 MG tablet Take 1 tablet (10 mg total) by mouth at bedtime as needed for sleep. 30 tablet 0  . aspirin 81 MG tablet Take 1 tablet (81 mg total) by mouth daily. (Patient not taking: Reported on 02/25/2016) 30 tablet   . azelastine (ASTELIN) 0.1 % nasal spray Place 1 spray into both nostrils 2 (two) times daily. Use in each nostril as directed (Patient not taking: Reported on 06/07/2016) 30 mL 5   No current facility-administered medications on file prior to visit.    Allergies  Allergen Reactions  . Bee Venom Anaphylaxis    Throat swells  . Beef-Derived Products Anaphylaxis  . Metformin And Related     diarrhea   Family History  Problem Relation Age of Onset  . Heart disease Mother   . Diabetes Mother   . Arthritis Mother   . Hypertension Mother   . Heart disease Father   . Hypertension Father   . Diabetes Father   . Arthritis Father   . Cancer Maternal Uncle     prostate  . Cancer Maternal Grandmother     cervical   PE: BP 134/84 (BP Location: Left Arm, Patient Position: Sitting)   Pulse 80   Ht _0  (1.727 m)   Wt 195 lb (88.5 kg)   BMI 29.65 kg/m  Wt Readings from Last 3 Encounters:  06/07/16 195 lb (88.5 kg)  03/16/16 196 lb (88.9 kg)   02/25/16 193 lb 12.6 oz (87.9 kg)   Constitutional: overweight, in NAD Eyes: PERRLA, EOMI, no exophthalmos ENT: moist mucous membranes, no thyromegaly, no cervical lymphadenopathy Cardiovascular: RRR, No MRG Respiratory: CTA B Gastrointestinal: abdomen soft, NT, ND, BS+ Musculoskeletal: no deformities, strength intact in all 4 Skin: moist, warm, no rashes Neurological: no tremor with outstretched hands, DTR normal in all 4  ASSESSMENT: 1. DM2, non-insulin-dependent, uncontrolled, without long term complications, but with hyperglycemia  PLAN:  1. Patient with long-standing, uncontrolled diabetes, on oral antidiabetic regimen, which became insufficient. We discussed about his diet and made specific suggestions to improve it. 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 He would like to try to continue to make dietary changes as he would like to come off his DM meds and would not want any new meds now. He already improved his HbA1c since last check and this is encouraging. He also started to go to the gym since then >> will continue to go and try increase exercise.  - I did advise him to start checking sugars 1-2 times a day and let me know if they do not improve, in which case, he will need additional medication before next visit - I suggested to:  Patient Instructions  Please continue: - Invokana 300 mg daily in am - Januvia 100 mg daily in am  Please change your diet as we discussed.  Please return in 3 months with your sugar log.   - Strongly advised him to start checking sugars at different times of the day - check 1-2 times a day, rotating checks - given sugar log and advised how to fill it and to bring it at next appt  - given foot care handout and explained the principles  - given instructions for hypoglycemia management "15-15 rule"  - advised for yearly eye exams  - Return to clinic in 3 mo with sugar log   Philemon Kingdom, MD PhD [facility]  Endocrinology

## 2016-06-08 MED FILL — AZELASTINE 0.1% (137 MCG) S: 0.1 | 50 days supply | Qty: 30 | Fill #1

## 2016-06-21 ENCOUNTER — Encounter: Payer: Self-pay | Admitting: Family

## 2016-06-21 ENCOUNTER — Other Ambulatory Visit: Payer: Self-pay | Admitting: Family

## 2016-06-22 ENCOUNTER — Other Ambulatory Visit: Payer: Self-pay | Admitting: Internal Medicine

## 2016-06-22 MED ORDER — SITAGLIPTIN PHOSPHATE 100 MG PO TABS: 100.0000 mg | ORAL_TABLET | Freq: Every day | ORAL | 3 refills | Status: DC

## 2016-06-22 MED ORDER — ATORVASTATIN CALCIUM 20 MG PO TABS: 20.0000 mg | ORAL_TABLET | Freq: Every day | ORAL | 0 refills | Status: DC

## 2016-06-22 MED FILL — ATORVASTATIN 20 MG TABLET: 20 | 30 days supply | Qty: 30 | Fill #0

## 2016-06-22 MED FILL — JANUVIA 100 MG TABLET: 100 | 90 days supply | Qty: 90 | Fill #0

## 2016-06-22 NOTE — Telephone Encounter (Signed)
Refilled Januvia, ASA is OTC.

## 2016-06-22 NOTE — Telephone Encounter (Signed)
Pt informed of ASA, however he is also requesting refill on Invokana? Thank you Dr. Cruzita Lederer.

## 2016-06-24 ENCOUNTER — Other Ambulatory Visit: Payer: Self-pay | Admitting: Family

## 2016-06-25 MED FILL — INVOKANA 300 MG TABLET: 300 | 90 days supply | Qty: 90 | Fill #0

## 2016-06-25 NOTE — Telephone Encounter (Signed)
Refill sent per [facility] refill protocol/SLS  Requested drug refills are authorized, however, the patient needs further evaluation and/or laboratory testing before further refills are given. Ask him to make an appointment for this/SLS 05/11

## 2016-06-25 NOTE — Telephone Encounter (Signed)
Patient states he will call back once he looks at he's work schedule to schedule follow up

## 2016-07-26 ENCOUNTER — Encounter: Payer: Self-pay | Admitting: Family

## 2016-07-26 ENCOUNTER — Other Ambulatory Visit: Payer: Self-pay | Admitting: Family

## 2016-07-27 MED ORDER — BAYER MICROLET LANCETS MISC: 1 refills | Status: DC

## 2016-07-27 MED ORDER — GLUCOSE BLOOD VI STRP: ORAL_STRIP | 0 refills | Status: DC

## 2016-07-27 MED ORDER — CONTOUR NEXT MONITOR W/DEVICE KIT
1.0000 | PACK | Freq: Once | 0 refills | Status: AC
Start: 2016-07-27 — End: 2016-07-27

## 2016-07-27 MED ORDER — ATORVASTATIN CALCIUM 20 MG PO TABS: 20.0000 mg | ORAL_TABLET | Freq: Every day | ORAL | 1 refills | Status: DC

## 2016-07-27 MED ORDER — ASPIRIN 81 MG PO TABS: 81.0000 mg | ORAL_TABLET | Freq: Every day | ORAL | 1 refills | Status: DC

## 2016-07-27 MED FILL — MICROLET LANCETS: 34 days supply | Qty: 100 | Fill #0

## 2016-07-27 MED FILL — ATORVASTATIN 20 MG TABLET: 20 | 90 days supply | Qty: 90 | Fill #0

## 2016-07-27 MED FILL — CONTOUR NEXT STRIPS: 84 days supply | Qty: 250 | Fill #0

## 2016-07-27 MED FILL — ASPIR-LOW EC 81 MG TABLET: 81 | 90 days supply | Qty: 90 | Fill #0

## 2016-07-27 NOTE — Addendum Note (Signed)
Addended by: Kelle Darting A on: 07/27/2016 03:11 PM   Modules accepted: Orders

## 2016-07-27 NOTE — Telephone Encounter (Signed)
ASA and Atorvastatin refills sent. Pt has rescheduled for 08/11/16 with PCP.

## 2016-07-27 NOTE — Telephone Encounter (Signed)
Received call from [facility] [place] that insurance will no longer cover True Metrix brand strips / meter. Insurance prefers Contour Next meter / strips and microlet lancets. Rx sent.

## 2016-08-11 ENCOUNTER — Ambulatory Visit (INDEPENDENT_AMBULATORY_CARE_PROVIDER_SITE_OTHER): Payer: BLUE CROSS/BLUE SHIELD | Admitting: Family

## 2016-08-11 ENCOUNTER — Encounter: Payer: Self-pay | Admitting: Family

## 2016-08-11 VITALS — BP 129/82 | HR 70 | Temp 98.2°F | Resp 16 | Ht 68.0 in | Wt 192.0 lb

## 2016-08-11 DIAGNOSIS — G4700 Insomnia, unspecified: Secondary | ICD-10-CM

## 2016-08-11 DIAGNOSIS — C61 Malignant neoplasm of prostate: Secondary | ICD-10-CM | POA: Diagnosis not present

## 2016-08-11 DIAGNOSIS — E118 Type 2 diabetes mellitus with unspecified complications: Secondary | ICD-10-CM | POA: Diagnosis not present

## 2016-08-11 DIAGNOSIS — E785 Hyperlipidemia, unspecified: Secondary | ICD-10-CM

## 2016-08-11 NOTE — Assessment & Plan Note (Signed)
Stable with rare use of prn ambien.

## 2016-08-11 NOTE — Assessment & Plan Note (Signed)
PSA is being monitored by his Urologist- advised pt to follow up as scheduled.

## 2016-08-11 NOTE — Assessment & Plan Note (Signed)
Clinically stable. Management per endo.  

## 2016-08-11 NOTE — Patient Instructions (Signed)
Please keep your upcoming appointment with endocrinology.   

## 2016-08-11 NOTE — Assessment & Plan Note (Signed)
At goal on statin, continue same.

## 2016-08-11 NOTE — Progress Notes (Signed)
Subjective:    Patient ID: Larry Davis, male    DOB: 1956-01-01, 61 y.o.   MRN: [id number]  HPI  Larry Davis is a 61 yr old male who presents today for follow up.  1) hyperlipidemia- maintained on atorvastatin.  Lab Results  Component Value Date   CHOL 123 03/16/2016   HDL 34.50 (L) 03/16/2016   LDLCALC 67 03/16/2016   LDLDIRECT 142.0 11/13/2014   TRIG 103.0 03/16/2016   CHOLHDL 4 03/16/2016   2) DM2- patient continues [place] and invokana. 140-170. Seeing endo.  Lab Results  Component Value Date   HGBA1C 7.9 06/07/2016   HGBA1C 8.3 (H) 03/16/2016   HGBA1C 7.3 (H) 08/11/2015   Lab Results  Component Value Date   MICROALBUR <0.7 08/11/2015   LDLCALC 67 03/16/2016   CREATININE 0.93 03/16/2016   3) Insomnia- maintained on prn ambien. Reports very rare use.     Reports that he went to ER in November due to anaphylaxis.  Was told Alpha gal allergy- he was told to avoid all mammalian meats.  Review of Systems See HPI  Past Medical History:  Diagnosis Date  . Allergy   . Diabetes mellitus without complication ([place])   . PONV (postoperative nausea and vomiting)   . Prostate cancer ([place]) 03/2014  . Urticaria      Social History   Social History  . Marital status: Married    Spouse name: N/A  . Number of children: N/A  . Years of education: N/A   Occupational History  . Not on file.   Social History Main Topics  . Smoking status: Former Research scientist (life sciences)  . Smokeless tobacco: Never Used  . Alcohol use No  . Drug use: No  . Sexual activity: Not on file   Other Topics Concern  . 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 ADENOIDECTOMY    . CHOLECYSTECTOMY  2000  . LYMPHADENECTOMY Bilateral 06/14/2014   Procedure: PELVIC LYMPH NODE DISSECTION;  Surgeon: Alexis Frock, MD;  Location: [facility];  Service: Urology;  Laterality: Bilateral;  . ROBOT ASSISTED LAPAROSCOPIC RADICAL PROSTATECTOMY N/A 06/14/2014   Procedure: ROBOTIC ASSISTED LAPAROSCOPIC RADICAL PROSTATECTOMY WITH INDOCYANINE GREEN DYE INJECTION;  Surgeon: Alexis Frock, MD;  Location: [facility];  Service: Urology;  Laterality: N/A;  . SHOULDER SURGERY Right 1990   arthroscopy  . SINOSCOPY    . TONSILLECTOMY      Family History  Problem Relation Age of Onset  . Heart disease Mother   . Diabetes Mother   . Arthritis Mother   . Hypertension Mother   . Heart disease Father   . Hypertension Father   . Diabetes Father   . Arthritis Father   . Cancer Maternal Uncle        prostate  . Cancer Maternal Grandmother        cervical    Allergies  Allergen Reactions  . Bee Venom Anaphylaxis    Throat swells  . Beef-Derived Products Anaphylaxis  . Metformin And Related     diarrhea    Current Outpatient Prescriptions on File Prior to Visit  Medication Sig Dispense Refill  . aspirin 81 MG tablet Take 1  tablet (81 mg total) by mouth daily. 90 tablet 1  . atorvastatin (LIPITOR) 20 MG tablet Take 1 tablet (20 mg total) by mouth daily. 90 tablet 1  . azelastine (ASTELIN) 0.1 % nasal spray Place 1 spray into both nostrils 2 (two) times daily. Use in each nostril as directed 30 mL 5  . Azelastine-Fluticasone (DYMISTA) 137-50 MCG/ACT SUSP Place 1 spray into the nose 2 (two) times daily. 1 Bottle 5  . BAYER MICROLET LANCETS lancets Use to check blood sugar up to three times a day.  DX  E11.9 100 each 1  . glucose blood (CONTOUR NEXT TEST) test strip Use to check blood sugar up to three times a day.  DX  E11.9 270 each 0  . INVOKANA 300 MG TABS tablet TAKE 1 TABLET BY MOUTH DAILY BEFORE BREAKFAST. 90 tablet 0  . sitaGLIPtin (JANUVIA) 100 MG tablet Take 1 tablet (100 mg total) by mouth daily. 90 tablet 3  . zolpidem (AMBIEN) 10 MG tablet Take 1  tablet (10 mg total) by mouth at bedtime as needed for sleep. 30 tablet 0   No current facility-administered medications on file prior to visit.     BP 129/82 (BP Location: Right Arm, Cuff Size: Normal)   Pulse 70   Temp 98.2 F (36.8 C) (Oral)   Resp 16   Ht 5\' 8"  (1.727 m)   Wt 192 lb (87.1 kg)   SpO2 97%   BMI 29.19 kg/m       Objective:   Physical Exam  Constitutional: He is oriented to person, place, and time. He appears well-developed and well-nourished. No distress.  HENT:  Head: Normocephalic and atraumatic.  Cardiovascular: Normal rate and regular rhythm.   No murmur heard. Pulmonary/Chest: Effort normal and breath sounds normal. No respiratory distress. He has no wheezes. He has no rales.  Musculoskeletal: He exhibits no edema.  Neurological: He is alert and oriented to person, place, and time.  Skin: Skin is warm and dry.  Psychiatric: He has a normal mood and affect. His behavior is normal. Thought content normal.          Assessment & Plan:

## 2016-09-01 DIAGNOSIS — Z0120 Encounter for dental examination and cleaning without abnormal findings: Secondary | ICD-10-CM | POA: Diagnosis not present

## 2016-09-07 ENCOUNTER — Encounter: Payer: Self-pay | Admitting: Family

## 2016-09-07 ENCOUNTER — Other Ambulatory Visit: Payer: Self-pay | Admitting: Family

## 2016-09-09 ENCOUNTER — Other Ambulatory Visit: Payer: Self-pay | Admitting: Family

## 2016-09-09 ENCOUNTER — Telehealth: Payer: Self-pay | Admitting: Internal Medicine

## 2016-09-09 NOTE — Telephone Encounter (Signed)
**  Remind patient they can make refill requests via MyChart**  Medication refill request (Name & Dosage): sitaGLIPtin (JANUVIA) 100 MG tablet   Preferred pharmacy (Name & Address): [place], [place] - [address] [phone] (Phone) [phone] (Fax)       Other comments (if applicable):   Patient wants to make sure this Rx is for the 90 day supply

## 2016-09-10 ENCOUNTER — Other Ambulatory Visit: Payer: Self-pay

## 2016-09-10 MED ORDER — SITAGLIPTIN PHOSPHATE 100 MG PO TABS: 100.0000 mg | ORAL_TABLET | Freq: Every day | ORAL | 3 refills | Status: DC

## 2016-09-10 MED FILL — JANUVIA 100 MG TABLET: 100 | 90 days supply | Qty: 90 | Fill #0

## 2016-09-10 NOTE — Telephone Encounter (Signed)
Submitted

## 2016-09-13 ENCOUNTER — Other Ambulatory Visit: Payer: Self-pay | Admitting: Family

## 2016-09-13 NOTE — Telephone Encounter (Signed)
See phone note from Dr. Cruzita Lederer office

## 2016-09-14 ENCOUNTER — Telehealth: Payer: Self-pay | Admitting: Medical

## 2016-09-14 ENCOUNTER — Other Ambulatory Visit: Payer: Self-pay | Admitting: Family

## 2016-09-14 ENCOUNTER — Encounter: Payer: Self-pay | Admitting: Family

## 2016-09-14 MED ORDER — CANAGLIFLOZIN 300 MG PO TABS: ORAL_TABLET | ORAL | 0 refills | Status: DC

## 2016-09-14 MED ORDER — ZOLPIDEM TARTRATE 10 MG PO TABS: 10.0000 mg | ORAL_TABLET | Freq: Every evening | ORAL | 0 refills | Status: DC | PRN

## 2016-09-14 MED FILL — INVOKANA 300 MG TABLET: 300 | 90 days supply | Qty: 90 | Fill #0

## 2016-09-14 MED FILL — ZOLPIDEM TARTRATE 10 MG TAB: 10 | 30 days supply | Qty: 30 | Fill #0

## 2016-09-14 NOTE — Telephone Encounter (Signed)
mychart request for [facility].  Last filled 04/19/16.

## 2016-09-14 NOTE — Telephone Encounter (Addendum)
Further  ambien refills defer to pcp.  Did Melissa fill his invokana rx recently. Looks like he has adequate invokana.

## 2016-09-14 NOTE — Telephone Encounter (Signed)
See 09/14/16 pt email.

## 2016-09-14 NOTE — Telephone Encounter (Signed)
I just printed ambien rx and can sign it tomorrow. If you would get it off printer and place on my desk will sign.

## 2016-09-14 NOTE — Telephone Encounter (Signed)
Written rx shredded and verbal called to pharmacy. See 09/14/16 pt email.

## 2016-09-23 ENCOUNTER — Encounter: Payer: Self-pay | Admitting: Internal Medicine

## 2016-09-23 ENCOUNTER — Ambulatory Visit (INDEPENDENT_AMBULATORY_CARE_PROVIDER_SITE_OTHER): Payer: BLUE CROSS/BLUE SHIELD | Admitting: Internal Medicine

## 2016-09-23 VITALS — BP 132/82 | HR 68 | Wt 198.0 lb

## 2016-09-23 DIAGNOSIS — E1165 Type 2 diabetes mellitus with hyperglycemia: Secondary | ICD-10-CM | POA: Diagnosis not present

## 2016-09-23 DIAGNOSIS — Z6830 Body mass index (BMI) 30.0-30.9, adult: Secondary | ICD-10-CM

## 2016-09-23 DIAGNOSIS — E669 Obesity, unspecified: Secondary | ICD-10-CM

## 2016-09-23 DIAGNOSIS — E663 Overweight: Secondary | ICD-10-CM | POA: Insufficient documentation

## 2016-09-23 LAB — POCT GLYCOSYLATED HEMOGLOBIN (HGB A1C): HEMOGLOBIN A1C: 6.9

## 2016-09-23 MED ORDER — METFORMIN HCL ER 500 MG PO TB24: 500.0000 mg | ORAL_TABLET | Freq: Every day | ORAL | 11 refills | Status: DC

## 2016-09-23 MED FILL — METFORMIN HCL ER 500 MG TAB: 500 | 90 days supply | Qty: 90 | Fill #0

## 2016-09-23 NOTE — Addendum Note (Signed)
Addended by: Caprice Beaver T on: 09/23/2016 02:11 PM   Modules accepted: Orders

## 2016-09-23 NOTE — Patient Instructions (Addendum)
Please continue: - Invokana 300 mg daily in am - Januvia 100 mg daily in am.  Please start Metformin ER 500 mg with dinner x 4 days. If you tolerate this well, add another Metformin tablet (500 mg) with breakfast x 4 days. If you tolerate this well, add another metformin tablet with dinner (total 1000 mg) x 4 days. If you tolerate this well, add another metformin tablet with breakfast (total 1000 mg). Continue with 1000 mg of metformin 2x a day with breakfast and dinner.  Please return in 3 months with your sugar log.

## 2016-09-23 NOTE — Progress Notes (Signed)
Patient ID: Larry Davis, male   DOB: 1955/02/20, 61 y.o.   MRN: [id number]   HPI: Larry Davis is a 61 y.o.-year-old male, returning for f/u for DM2, dx in ~2008, non-insulin-dependent, uncontrolled, without long term complications. Last visit 3.5 mo ago.  Since last visit, a single closer attention to his diet and its relationship to the sugars. He still has dietary indiscretions but less frequent than before.  Last hemoglobin A1c was: Lab Results  Component Value Date   HGBA1C 7.9 06/07/2016   HGBA1C 8.3 (H) 03/16/2016   HGBA1C 7.3 (H) 08/11/2015   Pt is on a regimen of: - Januvia 100 mg daily in am - Invokana 300 mg daily in am He tried Metformin R and ER >> diarrhea.  Pt checks his sugars 1x a day: - am: 160-180 >> 150-160s, 180  - 2h after b'fast: n/c - before lunch: n/c >> 150-160s - 2h after lunch: n/c - before dinner: n/c >> 150-160s - 2h after dinner: n/c - bedtime: n/c - nighttime: n/c No lows. Lowest sugar was 140 >> 140; he has hypoglycemia awareness at 110.  Highest sugar was 185 >> 210.  Glucometer: AccuChek  Pt's meals are: 45-60 g carbs per meal, 50-60% vegetarian: - Breakfast: coffee + oatmeal or bagel, fruit - Lunch: salad + sandwich or leftovers from dinner  - Dinner: chicken and fish + vegetables + starch - Snacks: pretzels at night; frozen custard He saw nutrition in the past. No fast food. Diet sodas - not often. Unsweet tea.  He has a fitbit - aims for >10,000 steps per day  - No h/o CKD, last BUN/creatinine:  Lab Results  Component Value Date   BUN 15 03/16/2016   BUN 18 08/11/2015   CREATININE 0.93 03/16/2016   CREATININE 1.02 08/11/2015   - last set of lipids: Lab Results  Component Value Date   CHOL 123 03/16/2016   HDL 34.50 (L) 03/16/2016   LDLCALC 67 03/16/2016   LDLDIRECT 142.0 11/13/2014   TRIG 103.0 03/16/2016   CHOLHDL 4 03/16/2016  On Atorvastatin. - last eye exam was in 02/2016 [facility]) >> No DR. - he denies numbness  and tingling in his feet.  Pt has FH of DM in M and F.  ROS: Constitutional: no weight gain/no weight loss, no fatigue, no subjective hyperthermia, no subjective hypothermia Eyes: no blurry vision, no xerophthalmia ENT: no sore throat, no nodules palpated in throat, no dysphagia, no odynophagia, no hoarseness Cardiovascular: no CP/no SOB/no palpitations/no leg swelling Respiratory: no cough/no SOB/no wheezing Gastrointestinal: no N/no V/no D/no C/no acid reflux Musculoskeletal: no muscle aches/no joint aches Skin: no rashes, no hair loss Neurological: no tremors/no numbness/no tingling/no dizziness  I reviewed pt's medications, allergies, PMH, social hx, family hx, and changes were documented in the history of present illness. Otherwise, unchanged from my initial visit note.  Past Medical History:  Diagnosis Date  . Allergy   . Diabetes mellitus without complication ([place])   . PONV (postoperative nausea and vomiting)   . Prostate cancer ([place]) 03/2014  . Urticaria    Past Surgical History:  Procedure Laterality Date  . ADENOIDECTOMY    . CHOLECYSTECTOMY  2000  . LYMPHADENECTOMY Bilateral 06/14/2014   Procedure: PELVIC LYMPH NODE DISSECTION;  Surgeon: Alexis Frock, MD;  Location: [facility];  Service: Urology;  Laterality: Bilateral;  . ROBOT ASSISTED LAPAROSCOPIC RADICAL PROSTATECTOMY N/A 06/14/2014   Procedure: ROBOTIC ASSISTED LAPAROSCOPIC RADICAL PROSTATECTOMY WITH INDOCYANINE GREEN DYE INJECTION;  Surgeon: Alexis Frock,  MD;  Location: [facility];  Service: Urology;  Laterality: N/A;  . SHOULDER SURGERY Right 1990   arthroscopy  . SINOSCOPY    . TONSILLECTOMY     Social History   Social History  . Marital status: Married    Spouse name: N/A   Social History Main Topics  . Smoking status: Former Research scientist (life sciences)  . Smokeless tobacco: Never Used  . Alcohol use No  . Drug use: No  . Sexual activity: Not on file   Other Topics Concern  . 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 aspirin 81 MG tablet Take 1 tablet (81 mg total) by mouth daily. 90 tablet 1  . atorvastatin (LIPITOR) 20 MG tablet Take 1 tablet (20 mg total) by mouth daily. 90 tablet 1  . azelastine (ASTELIN) 0.1 % nasal spray Place 1 spray into both nostrils 2 (two) times daily. Use in each nostril as directed 30 mL 5  . Azelastine-Fluticasone (DYMISTA) 137-50 MCG/ACT SUSP Place 1 spray into the nose 2 (two) times daily. 1 Bottle 5  . BAYER MICROLET LANCETS lancets Use to check blood sugar up to three times a day.  DX  E11.9 100 each 1  . canagliflozin (INVOKANA) 300 MG TABS tablet TAKE 1 TABLET BY MOUTH DAILY BEFORE BREAKFAST. 90 tablet 0  . glucose blood (CONTOUR NEXT TEST) test strip Use to check blood sugar up to three times a day.  DX  E11.9 270 each 0  . sitaGLIPtin (JANUVIA) 100 MG tablet Take 1 tablet (100 mg total) by mouth daily. 90 tablet 3  . zolpidem (AMBIEN) 10 MG tablet Take 1 tablet (10 mg total) by mouth at bedtime as needed for sleep. 30 tablet 0   No current facility-administered medications on file prior to visit.    Allergies  Allergen Reactions  . Bee Venom Anaphylaxis    Throat swells  . Beef-Derived Products Anaphylaxis  . Metformin And Related     diarrhea   Family History  Problem Relation Age of Onset  . Heart disease Mother   . Diabetes Mother   . Arthritis Mother   . Hypertension Mother   . Heart disease Father   . Hypertension Father   . Diabetes Father   . Arthritis Father   . Cancer Maternal Uncle        prostate  . Cancer Maternal Grandmother        cervical   PE: Wt 198 lb (89.8 kg)   BMI 30.11 kg/m   Wt Readings from Last 3  Encounters:  09/23/16 198 lb (89.8 kg)  08/11/16 192 lb (87.1 kg)  06/07/16 195 lb (88.5 kg)   Constitutional: overweight, in NAD Eyes: PERRLA, EOMI, no exophthalmos ENT: moist mucous membranes, no thyromegaly, no cervical lymphadenopathy Cardiovascular: RRR, No MRG Respiratory: CTA B Gastrointestinal: abdomen soft, NT, ND, BS+ Musculoskeletal: no deformities, strength intact in all 4 Skin: moist, warm, no rashes Neurological: no tremor with outstretched hands, DTR normal in all 4  ASSESSMENT: 1. DM2, non-insulin-dependent, uncontrolled, without long term complications, but with hyperglycemia  2. Obesity class 1 BMI Classification:  <  18.5 underweight   18.5-24.9 normal weight   25.0-29.9 overweight   30.0-34.9 class I obesity   35.0-39.9 class II obesity   ? 40.0 class III obesity    PLAN:  1. Patient with long-standing, uncontrolled diabetes, on oral antidiabetic regimen, with still sugars above goal before meals, whenever he checks. At last visit, we discussed about his diet and I made specific suggestions how to improve it. 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 He is also working on increasing exercise, for now, he is walking. - Sugars are between 150 and 160 before meals and we discussed about needing to bring these at least 20 mg/dL lower. He agrees to retry metformin extended-release. - I suggested to:  Patient Instructions  Please continue: - Invokana 300 mg daily in am - Januvia 100 mg daily in am.  Please start Metformin ER 500 mg with dinner x 4 days. If you tolerate this well, add another Metformin tablet (500 mg) with breakfast x 4 days. If you tolerate this well, add another metformin tablet with dinner (total 1000 mg) x 4 days. If you tolerate this well, add another metformin tablet with breakfast (total 1000 mg). Continue with 1000 mg of metformin 2x a day with breakfast and dinner.  Please return in 3 months with your sugar  log.    - today, HbA1c is 6.9% (better!) - continue checking sugars at different times of the day - check 1-2x a day, rotating checks - advised for yearly eye exams >> he is UTD - Return to clinic in 3 mo with sugar log   2. Obesity class 1 - His weight increased since last visit and we again discussed about changing his diet to improve his weight and his diabetes - He started to pay closer attention to his diet but knows that he needs to improve the diet even more.    Philemon Kingdom, MD PhD [facility] Endocrinology

## 2016-09-28 DIAGNOSIS — N5201 Erectile dysfunction due to arterial insufficiency: Secondary | ICD-10-CM | POA: Diagnosis not present

## 2016-09-28 DIAGNOSIS — C61 Malignant neoplasm of prostate: Secondary | ICD-10-CM | POA: Diagnosis not present

## 2016-09-28 DIAGNOSIS — N393 Stress incontinence (female) (male): Secondary | ICD-10-CM | POA: Diagnosis not present

## 2016-09-28 DIAGNOSIS — E291 Testicular hypofunction: Secondary | ICD-10-CM | POA: Diagnosis not present

## 2016-11-03 DIAGNOSIS — Z0120 Encounter for dental examination and cleaning without abnormal findings: Secondary | ICD-10-CM | POA: Diagnosis not present

## 2016-11-18 ENCOUNTER — Encounter: Payer: Self-pay | Admitting: Family

## 2016-11-19 MED ORDER — EPINEPHRINE 0.3 MG/0.3ML IJ SOAJ: 0.3000 mg | Freq: Once | INTRAMUSCULAR | 1 refills | Status: DC

## 2016-11-19 MED FILL — EPINEPHRINE 0.3 MG AUTO-INJ: 0.3 | 30 days supply | Qty: 2 | Fill #0

## 2016-11-19 MED FILL — AZELASTINE 0.1% (137 MCG) S: 0.1 | 50 days supply | Qty: 30 | Fill #2

## 2016-12-03 ENCOUNTER — Other Ambulatory Visit: Payer: Self-pay | Admitting: Family

## 2016-12-03 MED FILL — METFORMIN HCL ER 500 MG TAB: 500 | 90 days supply | Qty: 90 | Fill #1

## 2016-12-03 MED FILL — JANUVIA 100 MG TABLET: 100 | 90 days supply | Qty: 90 | Fill #1

## 2016-12-03 MED FILL — INVOKANA 300 MG TABLET: 300 | 14 days supply | Qty: 14 | Fill #0

## 2016-12-24 ENCOUNTER — Ambulatory Visit: Payer: 59 | Admitting: Internal Medicine

## 2016-12-24 ENCOUNTER — Encounter: Payer: Self-pay | Admitting: Internal Medicine

## 2016-12-24 VITALS — BP 136/84 | HR 95 | Ht 68.0 in | Wt 197.0 lb

## 2016-12-24 DIAGNOSIS — Z23 Encounter for immunization: Secondary | ICD-10-CM | POA: Diagnosis not present

## 2016-12-24 DIAGNOSIS — E1165 Type 2 diabetes mellitus with hyperglycemia: Secondary | ICD-10-CM

## 2016-12-24 DIAGNOSIS — E663 Overweight: Secondary | ICD-10-CM | POA: Diagnosis not present

## 2016-12-24 LAB — POCT GLYCOSYLATED HEMOGLOBIN (HGB A1C): HEMOGLOBIN A1C: 7.1

## 2016-12-24 MED ORDER — GLUCOSE BLOOD VI STRP: ORAL_STRIP | 3 refills | Status: DC

## 2016-12-24 MED ORDER — METFORMIN HCL ER 500 MG PO TB24: 1000.0000 mg | ORAL_TABLET | Freq: Two times a day (BID) | ORAL | 3 refills | Status: DC

## 2016-12-24 MED ORDER — BAYER MICROLET LANCETS MISC: 3 refills | Status: DC

## 2016-12-24 MED ORDER — SITAGLIPTIN PHOSPHATE 100 MG PO TABS: 100.0000 mg | ORAL_TABLET | Freq: Every day | ORAL | 3 refills | Status: DC

## 2016-12-24 MED ORDER — CANAGLIFLOZIN 300 MG PO TABS: ORAL_TABLET | ORAL | 11 refills | Status: DC

## 2016-12-24 NOTE — Patient Instructions (Addendum)
Please continue: - Invokana 300 mg daily in am - Januvia 100 mg daily in am  Please increase: - Metformin ER 1000 mg 2x a day with breakfast and dinner  Please return in 3 months with your sugar log.

## 2016-12-24 NOTE — Progress Notes (Signed)
Patient ID: Larry Davis, male   DOB: 1955/11/23, 61 y.o.   MRN: [id number]   HPI: Larry Davis is a 61 y.o.-year-old male, returning for f/u for DM2, dx in ~2008, non-insulin-dependent, uncontrolled, without long term complications. Last visit 3 mo ago.  Last hemoglobin A1c was: Lab Results  Component Value Date   HGBA1C 6.9 09/23/2016   HGBA1C 7.9 06/07/2016   HGBA1C 8.3 (H) 03/16/2016   Pt is on a regimen of: - Januvia 100 mg daily in am - Invokana 300 mg daily in am - Metformin ER 500 mg with b'fast - added 09/2016 He tried Metformin R and ER >> diarrhea.  Pt checks his sugars 1x a day: - am: 160-180 >> 150-160s, 180 >> 120, 145-150 - 2h after b'fast: n/c - before lunch: n/c >> 150-160s >> 145-150 - 2h after lunch: n/c - before dinner: n/c >> 150-160s >> 150-160 - 2h after dinner: n/c - bedtime: n/c - nighttime: n/c Lowest sugar was 140 >> 140 >> 120 he has hypoglycemia awareness at 110  Highest sugar was 185 >> 210 >> 185 (mexican foods)  Glucometer: AccuChek  Pt's meals are: 45-60 g carbs per meal, 50-60% vegetarian: - Breakfast: coffee + oatmeal or bagel, fruit - Lunch: salad + sandwich or leftovers from dinner  - Dinner: chicken and fish + vegetables + starch - Snacks: pretzels at night; frozen custard He saw nutrition in the past.  No fast food. Diet sodas - not often. Unsweet tea.  He is walking: 3x a week: 3 mi on treadmill in am.  - No CKD, last BUN/creatinine:  Lab Results  Component Value Date   BUN 15 03/16/2016   BUN 18 08/11/2015   CREATININE 0.93 03/16/2016   CREATININE 1.02 08/11/2015   - + HL; Last set of lipids: Lab Results  Component Value Date   CHOL 123 03/16/2016   HDL 34.50 (L) 03/16/2016   LDLCALC 67 03/16/2016   LDLDIRECT 142.0 11/13/2014   TRIG 103.0 03/16/2016   CHOLHDL 4 03/16/2016  On atorvastatin. - last eye exam was in 02/2016 [facility]) >> No DR - no numbness and tingling in his feet.  ROS: Constitutional: no weight  gain/no weight loss, no fatigue, no subjective hyperthermia, no subjective hypothermia Eyes: no blurry vision, no xerophthalmia ENT: no sore throat, no nodules palpated in throat, no dysphagia, no odynophagia, no hoarseness Cardiovascular: no CP/no SOB/no palpitations/no leg swelling Respiratory: no cough/no SOB/no wheezing Gastrointestinal: no N/no V/no D/no C/no acid reflux Musculoskeletal: no muscle aches/no joint aches Skin: no rashes, no hair loss Neurological: no tremors/no numbness/no tingling/no dizziness  I reviewed pt's medications, allergies, PMH, social hx, family hx, and changes were documented in the history of present illness. Otherwise, unchanged from my initial visit note.  Past Medical History:  Diagnosis Date  . Allergy   . Diabetes mellitus without complication ([place])   . PONV (postoperative nausea and vomiting)   . Prostate cancer ([place]) 03/2014  . Urticaria    Past Surgical History:  Procedure Laterality Date  . ADENOIDECTOMY    . CHOLECYSTECTOMY  2000  . SHOULDER SURGERY Right 1990   arthroscopy  . SINOSCOPY    . TONSILLECTOMY     Social History   Social History  . Marital status: Married    Spouse name: N/A   Social History Main Topics  . Smoking status: Former Research scientist (life sciences)  . Smokeless tobacco: Never Used  . Alcohol use No  . Drug use: No  . Sexual  activity: Not on file   Other Topics Concern  . 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 aspirin 81 MG tablet Take 1 tablet (81 mg total) by mouth daily. 90 tablet 1  . atorvastatin (LIPITOR) 20 MG tablet Take 1 tablet (20 mg total) by mouth daily. 90 tablet 1  . azelastine (ASTELIN)  0.1 % nasal spray Place 1 spray into both nostrils 2 (two) times daily. Use in each nostril as directed 30 mL 5  . Azelastine-Fluticasone (DYMISTA) 137-50 MCG/ACT SUSP Place 1 spray into the nose 2 (two) times daily. 1 Bottle 5  . BAYER MICROLET LANCETS lancets Use to check blood sugar up to three times a day.  DX  E11.9 100 each 1  . glucose blood (CONTOUR NEXT TEST) test strip Use to check blood sugar up to three times a day.  DX  E11.9 270 each 0  . INVOKANA 300 MG TABS tablet TAKE 1 TABLET BY MOUTH DAILY BEFORE BREAKFAST. 14 tablet 0  . metFORMIN (GLUCOPHAGE-XR) 500 MG 24 hr tablet Take 1 tablet (500 mg total) by mouth daily with breakfast. 120 tablet 11  . sitaGLIPtin (JANUVIA) 100 MG tablet Take 1 tablet (100 mg total) by mouth daily. 90 tablet 3  . zolpidem (AMBIEN) 10 MG tablet Take 1 tablet (10 mg total) by mouth at bedtime as needed for sleep. 30 tablet 0  . EPINEPHrine 0.3 mg/0.3 mL IJ SOAJ injection Inject 0.3 mLs (0.3 mg total) into the muscle once. 2 Device 1   No current facility-administered medications on file prior to visit.    Allergies  Allergen Reactions  . Bee Venom Anaphylaxis    Throat swells  . Beef-Derived Products Anaphylaxis  . Metformin And Related     diarrhea   Family History  Problem Relation Age of Onset  . Heart disease Mother   . Diabetes Mother   . Arthritis Mother   . Hypertension Mother   . Heart disease Father   . Hypertension Father   . Diabetes Father   . Arthritis Father   . Cancer Maternal Uncle        prostate  . Cancer Maternal Grandmother        cervical   PE: BP 136/84   Pulse 95   Ht 5\' 8"  (1.727 m)   Wt 197 lb (89.4 kg)   SpO2 98%   BMI 29.95 kg/m  Wt Readings from Last 3 Encounters:  12/24/16 197 lb (89.4 kg)  09/23/16 198 lb (89.8 kg)  08/11/16 192 lb (87.1 kg)   Constitutional: overweight, in NAD Eyes: PERRLA, EOMI, no exophthalmos ENT: moist mucous membranes, no thyromegaly, no cervical  lymphadenopathy Cardiovascular: RRR, No MRG Respiratory: CTA B Gastrointestinal: abdomen soft, NT, ND, BS+ Musculoskeletal: no deformities, strength intact in all 4 Skin: moist, warm, no rashes Neurological: no tremor with outstretched hands, DTR normal in all 4  ASSESSMENT: 1. DM2, non-insulin-dependent, uncontrolled, without long term complications, but with hyperglycemia  2.  Overweight  PLAN:  1. Patient with long-standing, uncontrolled diabetes, on oral antidiabetic regimen with Invokana and Januvia, to which we added metformin at  last visit.  He did not understand the instructions and did not increase the metformin dose from the initial 500 mg tablets a day. He is taking this with breakfast. Today, HbA1c is 7.1% (higher) and sugars in a.m. and before meals are still high.  I advised him to increase metformin to a target of 1000 mg twice a day. - He continues to work on improving his diet. He eats less out and tries to incorporate more fruits and vegetables. 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  He is also increasing his exercise. - I suggested to:  Patient Instructions  Please continue: - Invokana 300 mg daily in am - Januvia 100 mg daily in am  Please increase: - Metformin ER 1000 mg 2x a day with breakfast and dinner  Please return in 3 months with your sugar log.    - continue checking sugars at different times of the day - check 1x a day, rotating checks - advised for yearly eye exams >> he is UTD - + flu shot today - Return to clinic in 3 mo with sugar log    2.  Overweight - Metformin will help with decreasing his appetite and along with his change in diet and exercise I think his weight will also improve.  Philemon Kingdom, MD PhD [facility] Endocrinology

## 2017-01-13 MED FILL — INVOKANA 300 MG TABLET: 300 | 30 days supply | Qty: 30 | Fill #0

## 2017-01-13 MED FILL — METFORMIN HCL ER 500 MG TAB: 500 | 90 days supply | Qty: 360 | Fill #0

## 2017-02-21 ENCOUNTER — Other Ambulatory Visit: Payer: Self-pay | Admitting: Medical

## 2017-02-21 MED FILL — INVOKANA 300 MG TABLET: 300 | 30 days supply | Qty: 30 | Fill #1

## 2017-02-21 MED FILL — JANUVIA 100 MG TABLET: 100 | 90 days supply | Qty: 90 | Fill #2

## 2017-02-21 NOTE — Telephone Encounter (Signed)
Pt is requesting refill on Ambien.  Last OV: 08/11/2016, appt scheduled 02/23/2017 Last Fill: 09/14/2016 #30 and 0RF UDS: 2016 Low risk  Please advise.

## 2017-02-22 MED ORDER — ZOLPIDEM TARTRATE 10 MG PO TABS: 10.0000 mg | ORAL_TABLET | Freq: Every evening | ORAL | 0 refills | Status: DC | PRN

## 2017-02-22 MED FILL — ZOLPIDEM TARTRATE 10 MG TAB: 10 | 30 days supply | Qty: 30 | Fill #0

## 2017-02-23 ENCOUNTER — Telehealth: Payer: Self-pay | Admitting: *Deleted

## 2017-02-23 ENCOUNTER — Ambulatory Visit: Payer: 59 | Admitting: Family

## 2017-02-23 ENCOUNTER — Encounter: Payer: Self-pay | Admitting: Family

## 2017-02-23 VITALS — BP 130/79 | HR 76 | Temp 98.4°F | Resp 16 | Ht 68.5 in | Wt 190.0 lb

## 2017-02-23 DIAGNOSIS — R9431 Abnormal electrocardiogram [ECG] [EKG]: Secondary | ICD-10-CM | POA: Diagnosis not present

## 2017-02-23 DIAGNOSIS — G4700 Insomnia, unspecified: Secondary | ICD-10-CM | POA: Diagnosis not present

## 2017-02-23 DIAGNOSIS — Z23 Encounter for immunization: Secondary | ICD-10-CM | POA: Diagnosis not present

## 2017-02-23 DIAGNOSIS — Z0000 Encounter for general adult medical examination without abnormal findings: Secondary | ICD-10-CM

## 2017-02-23 LAB — BASIC METABOLIC PANEL
BUN: 16 mg/dL (ref 6–23)
CALCIUM: 9.8 mg/dL (ref 8.4–10.5)
CO2: 26 mEq/L (ref 19–32)
Chloride: 105 mEq/L (ref 96–112)
Creatinine, Ser: 0.9 mg/dL (ref 0.40–1.50)
GFR: 91.21 mL/min (ref >60.00)
Glucose, Bld: 161 mg/dL — ABNORMAL HIGH (ref 70–99)
Potassium: 3.7 mEq/L (ref 3.5–5.1)
SODIUM: 140 meq/L (ref 135–145)

## 2017-02-23 LAB — CBC WITH DIFFERENTIAL/PLATELET
BASOS ABS: 0 10*3/uL (ref 0.0–0.1)
Basophils Relative: 0.5 % (ref 0.0–3.0)
Eosinophils Absolute: 0.1 10*3/uL (ref 0.0–0.7)
Eosinophils Relative: 1 % (ref 0.0–5.0)
HEMATOCRIT: 47.8 % (ref 39.0–52.0)
HEMOGLOBIN: 16.4 g/dL (ref 13.0–17.0)
LYMPHS PCT: 20.8 % (ref 12.0–46.0)
Lymphs Abs: 1.9 10*3/uL (ref 0.7–4.0)
MCHC: 34.3 g/dL (ref 30.0–36.0)
MCV: 91.3 fl (ref 78.0–100.0)
MONOS PCT: 6.6 % (ref 3.0–12.0)
Monocytes Absolute: 0.6 10*3/uL (ref 0.1–1.0)
NEUTROS ABS: 6.4 10*3/uL (ref 1.4–7.7)
Neutrophils Relative %: 71.1 % (ref 43.0–77.0)
Platelets: 216 10*3/uL (ref 150.0–400.0)
RBC: 5.24 Mil/uL (ref 4.22–5.81)
RDW: 13.5 % (ref 11.5–15.5)
WBC: 9 10*3/uL (ref 4.0–10.5)

## 2017-02-23 LAB — HEPATIC FUNCTION PANEL
ALBUMIN: 4.3 g/dL (ref 3.5–5.2)
ALT: 19 U/L (ref 0–53)
AST: 16 U/L (ref 0–37)
Alkaline Phosphatase: 52 U/L (ref 39–117)
Bilirubin, Direct: 0.1 mg/dL (ref 0.0–0.3)
TOTAL PROTEIN: 6.8 g/dL (ref 6.0–8.3)
Total Bilirubin: 0.5 mg/dL (ref 0.2–1.2)

## 2017-02-23 LAB — TSH: TSH: 0.81 u[IU]/mL (ref 0.35–4.50)

## 2017-02-23 NOTE — Telephone Encounter (Signed)
Pt was seen in the office today and UDS was ordered due to pt being prescribed ambien. Pt told the lab he had already completed a controlled substance contract. Message was sent after pt had left the office and it appears that a contract was printed in July but a signed copy was never scanned into his chart.  Pt not due to follow up with PCP until June or July. Supposed to have nurse visit in 3 months.  Please advise?

## 2017-02-23 NOTE — Patient Instructions (Addendum)
Continue your work with healthy diet, exercise and weight loss efforts. Complete lab work prior to leaving.

## 2017-02-23 NOTE — Progress Notes (Signed)
Subjective:    Patient ID: Larry Davis, male    DOB: 1955-11-08, 62 y.o.   MRN: [id number]  HPI  Patient presents today for complete physical.  Immunizations:  Pneumovax 2015, no tetanus on file, flu shot up to date Diet: reports that diet is healthy Exercise: works out 5 days a week.    Wt Readings from Last 3 Encounters:  02/23/17 190 lb (86.2 kg)  12/24/16 197 lb (89.4 kg)  09/23/16 198 lb (89.8 kg)  Colonoscopy: 02/15/10- per pt recommendation for 10 year follow up.  Vision:  <2 years ago Dental:  Up to date  Insomnia- maintained on as needed ambien with rare use.   Prostate cancer- followed by Dr. Tresa Moore [facility]) Lab Results  Component Value Date   HGBA1C 7.1 12/24/2016     Review of Systems  Constitutional: Negative for unexpected weight change.  HENT: Positive for hearing loss. Negative for rhinorrhea.        Uses hearing aids  Eyes: Negative for visual disturbance.  Respiratory: Negative for cough.   Cardiovascular: Negative for leg swelling.  Gastrointestinal: Negative for constipation.       + diarrhea due to metformin  Genitourinary: Negative for dysuria and frequency.  Musculoskeletal: Negative for arthralgias.       Occasional back stiffness  Skin: Negative for rash.  Neurological: Negative for headaches.  Hematological: Negative for adenopathy.  Psychiatric/Behavioral:       Denies depression/anxiety   Past Medical History:  Diagnosis Date  . Allergy   . Diabetes mellitus without complication ([place])   . PONV (postoperative nausea and vomiting)   . Prostate cancer ([place]) 03/2014  . Urticaria      Social History   Socioeconomic History  . Marital status: Married    Spouse name: Not on file  . Number of children: Not on file  . Years of education: Not on file  . Highest education level: Not on file  Social Needs  . Financial resource strain: Not on file  . Food insecurity - worry: Not on file  . Food insecurity - inability: Not  on file  . Transportation needs - medical: Not on file  . Transportation needs - non-medical: Not on file  Occupational History  . Not on file  Tobacco Use  . Smoking status: Former Research scientist (life sciences)  . Smokeless tobacco: Never Used  Substance and Sexual Activity  . Alcohol use: No    Alcohol/week: 0.0 oz  . Drug use: No  . Sexual activity: Not on file  Other Topics Concern  . Not on file  Social History Narrative   2 children Son and daughter 3 biological grandchildren- live in [place]   Wife has 4 children   Married   Works as an Chief Financial Officer in Therapist, nutritional     Past Surgical History:  Procedure Laterality Date  . ADENOIDECTOMY    . CHOLECYSTECTOMY  2000  . LYMPHADENECTOMY Bilateral 06/14/2014   Procedure: PELVIC LYMPH NODE DISSECTION;  Surgeon: Alexis Frock, MD;  Location[facility];  Service: Urology;  Laterality: Bilateral;  . ROBOT ASSISTED LAPAROSCOPIC RADICAL PROSTATECTOMY N/A 06/14/2014   Procedure: ROBOTIC ASSISTED LAPAROSCOPIC RADICAL PROSTATECTOMY WITH INDOCYANINE GREEN DYE INJECTION;  Surgeon: Alexis Frock, MD;  Location: [facility];  Service: Urology;  Laterality: N/A;  . SHOULDER SURGERY Right 1990   arthroscopy  . SINOSCOPY    . TONSILLECTOMY      Family History  Problem Relation  Age of Onset  . Heart disease Mother   . Diabetes Mother   . Arthritis Mother   . Hypertension Mother   . Heart disease Father   . Hypertension Father   . Diabetes Father   . Arthritis Father   . Cancer Maternal Uncle        prostate  . Cancer Maternal Grandmother        cervical    Allergies  Allergen Reactions  . Bee Venom Anaphylaxis    Throat swells  . Beef-Derived Products Anaphylaxis  . Metformin And Related     diarrhea    Current Outpatient Medications on File Prior to Visit  Medication Sig Dispense Refill  . aspirin 81 MG tablet Take 1 tablet (81 mg total) by mouth daily. 90 tablet 1  . atorvastatin (LIPITOR) 20 MG  tablet Take 1 tablet (20 mg total) by mouth daily. 90 tablet 1  . azelastine (ASTELIN) 0.1 % nasal spray Place 1 spray into both nostrils 2 (two) times daily. Use in each nostril as directed 30 mL 5  . Azelastine-Fluticasone (DYMISTA) 137-50 MCG/ACT SUSP Place 1 spray into the nose 2 (two) times daily. 1 Bottle 5  . BAYER MICROLET LANCETS lancets Use to check blood sugar up to three times a day.  DX  E11.9 100 each 3  . canagliflozin (INVOKANA) 300 MG TABS tablet TAKE 1 TABLET BY MOUTH DAILY BEFORE BREAKFAST. 30 tablet 11  . glucose blood (CONTOUR NEXT TEST) test strip Use to check blood sugar up to three times a day.  DX  E11.9 270 each 3  . metFORMIN (GLUCOPHAGE-XR) 500 MG 24 hr tablet Take 2 tablets (1,000 mg total) 2 (two) times daily with a meal by mouth. 360 tablet 3  . sitaGLIPtin (JANUVIA) 100 MG tablet Take 1 tablet (100 mg total) daily by mouth. 90 tablet 3  . zolpidem (AMBIEN) 10 MG tablet Take 1 tablet (10 mg total) by mouth at bedtime as needed for sleep. 30 tablet 0  . EPINEPHrine 0.3 mg/0.3 mL IJ SOAJ injection Inject 0.3 mLs (0.3 mg total) into the muscle once. 2 Device 1   No current facility-administered medications on file prior to visit.     BP 130/79 (BP Location: Left Arm, Patient Position: Sitting, Cuff Size: Small)   Pulse 76   Temp 98.4 F (36.9 C) (Oral)   Resp 16   Ht 5' 8.5" (1.74 m)   Wt 190 lb (86.2 kg)   SpO2 98%   BMI 28.47 kg/m      Objective:   Physical Exam Physical Exam  Constitutional: He is oriented to person, place, and time. He appears well-developed and well-nourished. No distress.  HENT:  Head: Normocephalic and atraumatic.  Right Ear: Tympanic membrane and ear canal normal.  Left Ear: Tympanic membrane and ear canal normal.  Mouth/Throat: Oropharynx is clear and moist.  Eyes: Pupils are equal, round, and reactive to light. No scleral icterus.  Neck: Normal range of motion. No thyromegaly present.  Cardiovascular: Normal rate and regular  rhythm.   No murmur heard. Pulmonary/Chest: Effort normal and breath sounds normal. No respiratory distress. He has no wheezes. He has no rales. He exhibits no tenderness.  Abdominal: Soft. Bowel sounds are normal. He exhibits no distension and no mass. There is no tenderness. There is no rebound and no guarding.  Musculoskeletal: He exhibits no edema.  Lymphadenopathy:    He has no cervical adenopathy.  Neurological: He is alert and oriented to person,  place, and time. He has normal patellar reflexes. He exhibits normal muscle tone. Coordination normal.  Skin: Skin is warm and dry. Some sunspots noted bilateral forearms Psychiatric: He has a normal mood and affect. His behavior is normal. Judgment and thought content normal.           Assessment & Plan:  Preventative care- encouraged pt to continue healthy diet, exercise and weight loss. Obtain routine lab work Tdap and Shingrix today.   Abnormal EKG- EKG tracing is personally reviewed.  EKG notes NSR.  New note of LAFB. Given DM diagnosis and fhx of CAD in both parents will refer to cardiology for further risk stratification.  Pt is agreeable to plan.   Insomnia- stable with prn use of ambien, continue same, obtain uds.      Assessment & Plan:

## 2017-02-23 NOTE — Telephone Encounter (Signed)
Lets have him sign it if/when he needs to pick up [place] script if sooner than his next appointment please.

## 2017-02-24 LAB — PAIN MGMT, PROFILE 8 W/CONF, U
6 ACETYLMORPHINE: NEGATIVE ng/mL (ref <10)
Alcohol Metabolites: NEGATIVE ng/mL (ref <500)
Amphetamines: NEGATIVE ng/mL (ref <500)
Benzodiazepines: NEGATIVE ng/mL (ref <100)
Buprenorphine, Urine: NEGATIVE ng/mL (ref <5)
COCAINE METABOLITE: NEGATIVE ng/mL (ref <150)
CREATININE: 70.3 mg/dL
MDMA: NEGATIVE ng/mL (ref <500)
Marijuana Metabolite: NEGATIVE ng/mL (ref <20)
OPIATES: NEGATIVE ng/mL (ref <100)
OXIDANT: NEGATIVE ug/mL (ref <200)
Oxycodone: NEGATIVE ng/mL (ref <100)
PH: 6.29 (ref 4.5–9.0)

## 2017-02-24 LAB — URINALYSIS, ROUTINE W REFLEX MICROSCOPIC
Bilirubin Urine: NEGATIVE
Hgb urine dipstick: NEGATIVE
Leukocytes, UA: NEGATIVE
NITRITE: NEGATIVE
RBC / HPF: NONE SEEN (ref 0–?)
SPECIFIC GRAVITY, URINE: 1.015 (ref 1.000–1.030)
Total Protein, Urine: NEGATIVE
Urobilinogen, UA: 0.2 (ref 0.0–1.0)
WBC UA: NONE SEEN (ref 0–?)
pH: 5.5 (ref 5.0–8.0)

## 2017-02-25 NOTE — Telephone Encounter (Signed)
Notified pt and he states she will stop by at his earliest convenience to sign contract. Contract printed and left at front desk for signature.

## 2017-02-25 NOTE — Progress Notes (Signed)
Letter mailed out to pt

## 2017-03-01 ENCOUNTER — Encounter: Payer: Self-pay | Admitting: Internal Medicine

## 2017-03-01 ENCOUNTER — Encounter: Payer: Self-pay | Admitting: Family

## 2017-03-01 DIAGNOSIS — E785 Hyperlipidemia, unspecified: Secondary | ICD-10-CM

## 2017-03-02 NOTE — Telephone Encounter (Signed)
Melissa-- I don't see that cholesterol test was ordered? Please advise re: EKG?

## 2017-03-02 NOTE — Telephone Encounter (Signed)
Could you please put him on lab schedule 1/28 at 1:30?  thanks

## 2017-03-04 NOTE — Telephone Encounter (Signed)
Lab appt scheduled and future order placed.

## 2017-03-14 ENCOUNTER — Other Ambulatory Visit (INDEPENDENT_AMBULATORY_CARE_PROVIDER_SITE_OTHER): Payer: 59

## 2017-03-14 ENCOUNTER — Encounter: Payer: Self-pay | Admitting: Cardiology

## 2017-03-14 ENCOUNTER — Ambulatory Visit: Payer: 59 | Admitting: Cardiology

## 2017-03-14 VITALS — BP 108/76 | HR 65 | Ht 68.5 in | Wt 192.0 lb

## 2017-03-14 DIAGNOSIS — R9431 Abnormal electrocardiogram [ECG] [EKG]: Secondary | ICD-10-CM | POA: Diagnosis not present

## 2017-03-14 DIAGNOSIS — E785 Hyperlipidemia, unspecified: Secondary | ICD-10-CM

## 2017-03-14 DIAGNOSIS — E1165 Type 2 diabetes mellitus with hyperglycemia: Secondary | ICD-10-CM

## 2017-03-14 LAB — LIPID PANEL
Cholesterol: 166 mg/dL (ref 0–200)
HDL: 41.7 mg/dL (ref >39.00)
LDL CALC: 96 mg/dL (ref 0–99)
NONHDL: 124.58
Total CHOL/HDL Ratio: 4
Triglycerides: 142 mg/dL (ref 0.0–149.0)
VLDL: 28.4 mg/dL (ref 0.0–40.0)

## 2017-03-14 MED ORDER — ASPIRIN EC 325 MG PO TBEC: 325.0000 mg | DELAYED_RELEASE_TABLET | Freq: Every day | ORAL | 0 refills | Status: DC

## 2017-03-14 MED FILL — ASPIRIN EC 325 MG TABLET: 325 | 30 days supply | Qty: 30 | Fill #0

## 2017-03-14 NOTE — Progress Notes (Signed)
Cardiology Office Note:    Date:  03/14/2017   ID:  Larry Davis, DOB 1955-08-10, MRN [id number]  PCP:  Debbrah Alar, NP  Cardiologist:  Jenean Lindau, MD   Referring MD: Debbrah Alar, NP    ASSESSMENT:    1. Abnormal electrocardiogram (ECG) (EKG)   2. Type 2 diabetes mellitus with hyperglycemia, without long-term current use of insulin (HCC)    PLAN:    In order of problems listed above:  1. Secondary prevention stressed with the patient.  Importance of compliance with diet and medications stressed and he vocalized understanding.  Diet was discussed for diabetes mellitus.  Weight reduction was stressed.  He has some mild abdominal obesity.  Risks of this were explained. 2. His blood pressure is fine.  However I discussed with him about being on ACE inhibitor or ARB therapy in view of diabetes mellitus.  Similarly statin therapy for diabetes were also discussed and he will discuss these issues with his primary care physician.  I urged him to increase his aspirin to 325 mg daily he has no adverse effects with this dose. 3. He will undergo exercise stress echo testing in view of abnormal EKG.  Will be seen in follow-up appointment in a month or earlier if he has any concerns.  He has a good exercise protocol and I advised him to continue it.   Medication Adjustments/Labs and Tests Ordered: Current medicines are reviewed at length with the patient today.  Concerns regarding medicines are outlined above.  No orders of the defined types were placed in this encounter.  No orders of the defined types were placed in this encounter.    History of Present Illness:    Larry Davis is a 62 y.o. male who is being seen today for the evaluation of abnormal EKG at the request of Debbrah Alar, NP.  Patient is a pleasant 62 year old male.  He has past medical history of diabetes mellitus.  He mentions to me that he does get his exercise on a regular basis.  He denies  any history of hypertension dyslipidemia or any cardiac issues.  He was found to have an abnormal EKG and referred here.  At the time of my evaluation, the patient is alert awake oriented and in no distress.  No history of syncope or shortness of breath on exertion.  Past Medical History:  Diagnosis Date  . Allergy   . Diabetes mellitus without complication ([place])   . PONV (postoperative nausea and vomiting)   . Prostate cancer ([place]) 03/2014  . Urticaria     Past Surgical History:  Procedure Laterality Date  . ADENOIDECTOMY    . CHOLECYSTECTOMY  2000  . LYMPHADENECTOMY Bilateral 06/14/2014   Procedure: PELVIC LYMPH NODE DISSECTION;  Surgeon: Alexis Frock, MD;  Location: [facility];  Service: Urology;  Laterality: Bilateral;  . ROBOT ASSISTED LAPAROSCOPIC RADICAL PROSTATECTOMY N/A 06/14/2014   Procedure: ROBOTIC ASSISTED LAPAROSCOPIC RADICAL PROSTATECTOMY WITH INDOCYANINE GREEN DYE INJECTION;  Surgeon: Alexis Frock, MD;  Location: [facility];  Service: Urology;  Laterality: N/A;  . SHOULDER SURGERY Right 1990   arthroscopy  . SINOSCOPY    . TONSILLECTOMY      Current Medications: Current Meds  Medication Sig  . aspirin 81 MG tablet Take 1 tablet (81 mg total) by mouth daily.  Marland Kitchen azelastine (ASTELIN) 0.1 % nasal spray Place 1 spray into both nostrils 2 (two) times daily. Use in each nostril as directed  . Azelastine-Fluticasone (DYMISTA) 137-50 MCG/ACT SUSP  Place 1 spray into the nose 2 (two) times daily.  Marland Kitchen BAYER MICROLET LANCETS lancets Use to check blood sugar up to three times a day.  DX  E11.9  . canagliflozin (INVOKANA) 300 MG TABS tablet TAKE 1 TABLET BY MOUTH DAILY BEFORE BREAKFAST.  Marland Kitchen glucose blood (CONTOUR NEXT TEST) test strip Use to check blood sugar up to three times a day.  DX  E11.9  . metFORMIN (GLUCOPHAGE-XR) 500 MG 24 hr tablet Take 2 tablets (1,000 mg total) 2 (two) times daily with a meal by mouth.  . sitaGLIPtin (JANUVIA) 100 MG tablet Take 1 tablet (100 mg total) daily  by mouth.  . zolpidem (AMBIEN) 10 MG tablet Take 1 tablet (10 mg total) by mouth at bedtime as needed for sleep.     Allergies:   Bee venom; Beef-derived products; and Metformin and related   Social History   Socioeconomic History  . Marital status: Married    Spouse name: None  . Number of children: None  . Years of education: None  . Highest education level: None  Social Needs  . Financial resource strain: None  . Food insecurity - worry: None  . Food insecurity - inability: None  . Transportation needs - medical: None  . Transportation needs - non-medical: None  Occupational History  . None  Tobacco Use  . Smoking status: Former Research scientist (life sciences)  . Smokeless tobacco: Never Used  Substance and Sexual Activity  . Alcohol use: No    Alcohol/week: 0.0 oz  . Drug use: No  . Sexual activity: None  Other Topics Concern  . None  Social History Narrative   2 children Son and daughter 3 biological grandchildren- live in [place]   Wife has 4 children   Married   Futures trader- retired since 11/18   Has masters degree   Enjoys Motorcycling      Family History: The patient's family history includes Arthritis in his father and mother; Cancer in his maternal grandmother and maternal uncle; Diabetes in his father and mother; Heart disease in his father and mother; Hypertension in his father and mother.  ROS:   Please see the history of present illness.    All other systems reviewed and are negative.  EKGs/Labs/Other Studies Reviewed:    The following studies were reviewed today: I discussed my findings with the patient at extensive length and his evaluation today was overall unremarkable.  His abnormal EKG will need an evaluation considering that his risk factors for coronary artery disease.   Recent Labs: 02/23/2017: ALT 19; BUN 16; Creatinine, Ser 0.90; Hemoglobin 16.4; Platelets 216.0; Potassium 3.7; Sodium 140; TSH 0.81  Recent Lipid Panel    Component Value Date/Time   CHOL  123 03/16/2016 0828   TRIG 103.0 03/16/2016 0828   HDL 34.50 (L) 03/16/2016 0828   CHOLHDL 4 03/16/2016 0828   VLDL 20.6 03/16/2016 0828   LDLCALC 67 03/16/2016 0828   LDLDIRECT 142.0 11/13/2014 0741    Physical Exam:    VS:  BP 108/76 (BP Location: Right Arm, Patient Position: Sitting, Cuff Size: Normal)   Pulse 65   Ht 5' 8.5" (1.74 m)   Wt 192 lb (87.1 kg)   SpO2 98%   BMI 28.77 kg/m     Wt Readings from Last 3 Encounters:  03/14/17 192 lb (87.1 kg)  02/23/17 190 lb (86.2 kg)  12/24/16 197 lb (89.4 kg)     GEN: Patient is in no acute distress HEENT: Normal NECK: No JVD;  No carotid bruits LYMPHATICS: No lymphadenopathy CARDIAC: S1 S2 regular, 2/6 systolic murmur at the apex. RESPIRATORY:  Clear to auscultation without rales, wheezing or rhonchi  ABDOMEN: Soft, non-tender, non-distended MUSCULOSKELETAL:  No edema; No deformity  SKIN: Warm and dry NEUROLOGIC:  Alert and oriented x 3 PSYCHIATRIC:  Normal affect    Signed, Jenean Lindau, MD  03/14/2017 2:21 PM    [facility]

## 2017-03-14 NOTE — Patient Instructions (Signed)
Medication Instructions:  Your physician has recommended you make the following change in your medication:  STOP 81 mg aspirin START 325 mg enteric coated aspirin daily  Labwork: None  Testing/Procedures: Your physician has requested that you have a stress echocardiogram. For further information please visit [email or web address]. Please follow instruction sheet as given.  Follow-Up: Your physician recommends that you schedule a follow-up appointment in: 1 month  Any Other Special Instructions Will Be Listed Below (If Applicable).     If you need a refill on your cardiac medications before your next appointment, please call your pharmacy.   [place], RN, BSN    Cardiopulmonary Exercise Stress Test Cardiopulmonary exercise testing (CPET) is a test that checks how your heart and lungs react to exercise. This is called your exercise capacity. During this test, you will walk or run on a treadmill or pedal on a stationary bike while tests are done on your heart and lungs. You may have this test to:  See why you are short of breath.  Check for exercise intolerance.  See how your lungs work.  See how your heart works.  Check for how you are responding to a heart or lung rehabilitation program.  See if you have a heart or lung problem.  See if you are healthy enough to have surgery.  What happens before the procedure?  Follow instructions from your doctor about what you cannot eat or drink.  Ask your doctor about changing or stopping your normal medicines. This is important if you take diabetes medicines or blood thinners.  Wear loose, comfortable clothing and shoes.  If you use an inhaler, bring it with you to the test. What happens during the procedure?  A blood pressure cuff will be placed on your arm.  Several stick-on patches (electrodes) will be placed on your chest. They will be attached to an electrocardiogram (EKG) machine.  A clip-on  monitor that measures the amount of oxygen in your blood will be placed on your finger (pulse oximeter).  A clip will be placed on your nose and a mouthpiece will be placed in your mouth. This may be held in place with a headpiece. You will breathe through the mouthpiece during the test.  You will be asked to start exercising. You will be closely watched while you exercise.  The amount of effort for your exercise will be gradually increased.  During exercise, the test will measure: ? Your heart rate. ? Your heart rhythm. ? Your oxygen blood level. ? The amount of oxygen and carbon dioxide that you breathe out.  The test will end when: ? You have finished the test. ? You have reached your maximum ability to exercise. ? You have chest or leg pain, dizziness, or shortness of breath. The procedure may vary among doctors and hospitals. What happens after the procedure?  Your blood pressure and EKG will be checked to watch how you recover from the test. This information is not intended to replace advice given to you by your health care provider. Make sure you discuss any questions you have with your health care provider. Document Released: 01/20/2009 Document Revised: 06/24/2015 Document Reviewed: 12/16/2014 Elsevier Interactive Patient Education  2018 [employer].

## 2017-03-28 ENCOUNTER — Telehealth: Payer: Self-pay | Admitting: Family

## 2017-03-28 NOTE — Telephone Encounter (Signed)
Copied from [place]. Topic: Quick Communication - See Telephone Encounter >> Mar 28, 2017  1:49 PM Rosalin Hawking wrote: CRM for notification. See Telephone encounter for:  03/28/17.   Pt came in office and stated had received a bill for $100.00 from [facility] on the day of service 02-23-2017, insurance told pt that procedure was coded wrong, pt mentioned that he had an appt on 02-23-2017 for his cpe and that his lab work was done but that provider forgot to put in pt to have done cholesterol labs so pt was called back to come to office to have it done. Pt mentioned that procedure was supposed to be part of a cpe and that he should have not received a bill for the lab. Pt would like to know if the procedure can be resubmitted with the right code. (pt left a copy of his bill at front desk- document given to [place]) Please advise pt about bill as soon as it has been verified.

## 2017-03-29 NOTE — Telephone Encounter (Signed)
[facility] was for patients UDS which was ordered with the incorrect DX code, emailed [facility] rep to have this bill refilled using code Z51.81.  Patient informed and voiced understanding

## 2017-04-12 ENCOUNTER — Other Ambulatory Visit (HOSPITAL_BASED_OUTPATIENT_CLINIC_OR_DEPARTMENT_OTHER): Payer: 59

## 2017-04-12 ENCOUNTER — Other Ambulatory Visit: Payer: Self-pay | Admitting: Cardiology

## 2017-04-12 MED FILL — ASPIRIN EC 325 MG TABLET: 325 | 30 days supply | Qty: 30 | Fill #0

## 2017-04-12 MED FILL — INVOKANA 300 MG TABLET: 300 | 30 days supply | Qty: 30 | Fill #2

## 2017-04-12 MED FILL — METFORMIN HCL ER 500 MG TAB: 500 | 90 days supply | Qty: 360 | Fill #1

## 2017-04-14 ENCOUNTER — Ambulatory Visit: Payer: 59 | Admitting: Cardiology

## 2017-04-25 ENCOUNTER — Ambulatory Visit: Payer: 59 | Admitting: Internal Medicine

## 2017-04-27 ENCOUNTER — Encounter: Payer: Self-pay | Admitting: Family

## 2017-05-01 ENCOUNTER — Encounter: Payer: Self-pay | Admitting: Family

## 2017-05-02 ENCOUNTER — Ambulatory Visit (HOSPITAL_BASED_OUTPATIENT_CLINIC_OR_DEPARTMENT_OTHER)
Admission: RE | Admit: 2017-05-02 | Discharge: 2017-05-02 | Disposition: A | Discharge status: Home / Self Care | Payer: 59 | Source: Ambulatory Visit | Attending: Family | Admitting: Family

## 2017-05-02 ENCOUNTER — Telehealth: Payer: Self-pay | Admitting: Allergy and Immunology

## 2017-05-02 ENCOUNTER — Encounter: Payer: Self-pay | Admitting: Family

## 2017-05-02 ENCOUNTER — Ambulatory Visit (INDEPENDENT_AMBULATORY_CARE_PROVIDER_SITE_OTHER): Payer: 59 | Admitting: Family

## 2017-05-02 ENCOUNTER — Ambulatory Visit: Payer: 59 | Admitting: Family

## 2017-05-02 VITALS — BP 136/68 | HR 59 | Temp 97.6°F | Resp 18 | Ht 69.0 in | Wt 193.4 lb

## 2017-05-02 DIAGNOSIS — M25512 Pain in left shoulder: Secondary | ICD-10-CM

## 2017-05-02 DIAGNOSIS — R937 Abnormal findings on diagnostic imaging of other parts of musculoskeletal system: Secondary | ICD-10-CM | POA: Insufficient documentation

## 2017-05-02 DIAGNOSIS — S4992XA Unspecified injury of left shoulder and upper arm, initial encounter: Secondary | ICD-10-CM | POA: Diagnosis not present

## 2017-05-02 DIAGNOSIS — S4991XA Unspecified injury of right shoulder and upper arm, initial encounter: Secondary | ICD-10-CM | POA: Diagnosis not present

## 2017-05-02 DIAGNOSIS — M25511 Pain in right shoulder: Secondary | ICD-10-CM | POA: Diagnosis not present
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
## 2017-05-02 MED ORDER — MELOXICAM 7.5 MG PO TABS
7.5000 mg | ORAL_TABLET | Freq: Every day | ORAL | 0 refills | Status: DC
Start: 2017-05-02 — End: 2017-05-27

## 2017-05-02 MED FILL — MELOXICAM 7.5 MG TABLET: 7.5 | 14 days supply | Qty: 14 | Fill #0
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
## 2017-05-02 NOTE — Telephone Encounter (Signed)
Patient called and said he was seen 02-25-2016 and had Alpha Gal testing. He said he thinks Dr. Verlin Fester wanted him to come back in a year and have this done again. I wanted to be sure before appointment was made. I told him a nurse would call to discuss.

## 2017-05-02 NOTE — Telephone Encounter (Signed)
Spoke to patient advised that upon chart review he needs to be retested for alpha gal. Appointment made for 05/09/17 @ 11 am patient did want to know if we have a lab person on site advised we do.

## 2017-05-02 NOTE — Patient Instructions (Addendum)
Begin meloxicam (anti-inflammatory) Complete x-rays on the first floor.  Schedule follow up with your allergist for follow up of your Alpha gal allergy. Call me if your shoulder pain is not improved in 1 week and we will plan referral to allergist.

## 2017-05-02 NOTE — Telephone Encounter (Signed)
Pt seen in the office today.  

## 2017-05-03 ENCOUNTER — Telehealth: Payer: Self-pay | Admitting: Family

## 2017-05-03 DIAGNOSIS — M75100 Unspecified rotator cuff tear or rupture of unspecified shoulder, not specified as traumatic: Secondary | ICD-10-CM

## 2017-05-03 NOTE — Telephone Encounter (Signed)
Attempted to reach pt. Call was picked up then disconnected. Sent mychart message regarding below and to let us know if any questions.

## 2017-05-03 NOTE — Telephone Encounter (Signed)
Please contact pt and let him know that his x-rays are negative for fracture but they both suggest rotator cuff tears. I have placed referral to orthopedics.

## 2017-05-03 NOTE — Telephone Encounter (Signed)
Patient called and back.  He was advised of note that was sent via mychart.

## 2017-05-04 ENCOUNTER — Encounter: Payer: Self-pay | Admitting: Family

## 2017-05-04 ENCOUNTER — Ambulatory Visit (INDEPENDENT_AMBULATORY_CARE_PROVIDER_SITE_OTHER): Payer: 59 | Admitting: Internal Medicine

## 2017-05-04 ENCOUNTER — Encounter: Payer: Self-pay | Admitting: Internal Medicine

## 2017-05-04 VITALS — BP 126/72 | HR 62 | Ht 69.0 in | Wt 196.8 lb

## 2017-05-04 DIAGNOSIS — M19112 Post-traumatic osteoarthritis, left shoulder: Secondary | ICD-10-CM | POA: Diagnosis not present

## 2017-05-04 DIAGNOSIS — E785 Hyperlipidemia, unspecified: Secondary | ICD-10-CM | POA: Diagnosis not present

## 2017-05-04 DIAGNOSIS — E663 Overweight: Secondary | ICD-10-CM | POA: Diagnosis not present

## 2017-05-04 DIAGNOSIS — M19111 Post-traumatic osteoarthritis, right shoulder: Secondary | ICD-10-CM | POA: Diagnosis not present

## 2017-05-04 DIAGNOSIS — E1165 Type 2 diabetes mellitus with hyperglycemia: Secondary | ICD-10-CM | POA: Diagnosis not present

## 2017-05-04 LAB — HEMOGLOBIN A1C: Hgb A1c MFr Bld: 6.4 % (ref 4.6–6.5)

## 2017-05-04 MED ORDER — GLIPIZIDE 5 MG PO TABS: 5.0000 mg | ORAL_TABLET | Freq: Two times a day (BID) | ORAL | 5 refills | Status: DC

## 2017-05-04 MED FILL — glipiZIDE 5 MG TABS: 5 | 30 days supply | Qty: 30 | Fill #0

## 2017-05-04 NOTE — Patient Instructions (Addendum)
Please continue: - Metformin ER 1000 mg 2x daily with meals - Invokana 300 mg daily in am - Januvia 100 mg daily in am  Please try to take:  - Glipizide 5 mg before b'fast and dinner  Please return in 3 months with your sugar log.
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
## 2017-05-05 ENCOUNTER — Other Ambulatory Visit: Payer: Self-pay | Admitting: Family

## 2017-05-05 MED ORDER — TRAMADOL HCL 50 MG PO TABS: 50.0000 mg | ORAL_TABLET | Freq: Three times a day (TID) | ORAL | 0 refills | Status: DC | PRN

## 2017-05-05 MED FILL — traMADol HCL 50 MG TABS: 50 | 5 days supply | Qty: 15 | Fill #0

## 2017-05-09 ENCOUNTER — Ambulatory Visit: Payer: Self-pay | Admitting: Allergy and Immunology

## 2017-05-13 ENCOUNTER — Other Ambulatory Visit: Payer: Self-pay | Admitting: Cardiology

## 2017-05-13 MED FILL — INVOKANA 300 MG TABLET: 300 | 30 days supply | Qty: 30 | Fill #3

## 2017-05-13 MED FILL — ASPIRIN EC 325 MG TABLET: 325 | 30 days supply | Qty: 30 | Fill #0

## 2017-05-16 DIAGNOSIS — C61 Malignant neoplasm of prostate: Secondary | ICD-10-CM | POA: Diagnosis not present

## 2017-05-23 DIAGNOSIS — N393 Stress incontinence (female) (male): Secondary | ICD-10-CM | POA: Diagnosis not present

## 2017-05-23 DIAGNOSIS — C61 Malignant neoplasm of prostate: Secondary | ICD-10-CM | POA: Diagnosis not present

## 2017-05-23 DIAGNOSIS — E291 Testicular hypofunction: Secondary | ICD-10-CM | POA: Diagnosis not present

## 2017-05-23 DIAGNOSIS — N5201 Erectile dysfunction due to arterial insufficiency: Secondary | ICD-10-CM | POA: Diagnosis not present

## 2017-05-26 ENCOUNTER — Encounter: Payer: Self-pay | Admitting: Family

## 2017-05-27 ENCOUNTER — Other Ambulatory Visit: Payer: Self-pay | Admitting: Family

## 2017-05-27 MED ORDER — TRAMADOL HCL 50 MG PO TABS: 50.0000 mg | ORAL_TABLET | Freq: Three times a day (TID) | ORAL | 0 refills | Status: DC | PRN

## 2017-05-27 MED ORDER — MELOXICAM 7.5 MG PO TABS: 7.5000 mg | ORAL_TABLET | Freq: Every day | ORAL | 0 refills | Status: DC

## 2017-05-27 MED FILL — traMADol HCL 50 MG TABS: 50 | 5 days supply | Qty: 15 | Fill #0

## 2017-05-27 MED FILL — MELOXICAM 7.5 MG TABLET: 7.5 | 30 days supply | Qty: 30 | Fill #0

## 2017-05-27 NOTE — Telephone Encounter (Signed)
Melissa, please see below and advise? If you decide to give refills, can we print Rx and have pt pick it up and complete contract at that time?  Last tramadol RX: 05/05/17, #15 Last OV: 05/02/17 Next OV: None scheduled UDS: 02/23/17. CSC: Contract was printed on 02/23/17 and pt was supposed to have returned to sign it but pt never returned.  CSR: No discrepancies identified

## 2017-05-30 DIAGNOSIS — M75100 Unspecified rotator cuff tear or rupture of unspecified shoulder, not specified as traumatic: Secondary | ICD-10-CM | POA: Insufficient documentation

## 2017-05-30 DIAGNOSIS — M19112 Post-traumatic osteoarthritis, left shoulder: Secondary | ICD-10-CM | POA: Diagnosis not present

## 2017-05-30 DIAGNOSIS — M12819 Other specific arthropathies, not elsewhere classified, unspecified shoulder: Secondary | ICD-10-CM | POA: Insufficient documentation

## 2017-05-30 DIAGNOSIS — M19111 Post-traumatic osteoarthritis, right shoulder: Secondary | ICD-10-CM | POA: Diagnosis not present

## 2017-05-30 MED FILL — DICLOFENAC SODIUM 1% GEL: 1 | 83 days supply | Qty: 500 | Fill #0

## 2017-06-07 ENCOUNTER — Other Ambulatory Visit: Payer: Self-pay

## 2017-06-07 ENCOUNTER — Encounter: Payer: Self-pay | Admitting: Internal Medicine

## 2017-06-07 MED ORDER — GLUCOSE BLOOD VI STRP: ORAL_STRIP | 3 refills | Status: DC

## 2017-06-08 ENCOUNTER — Telehealth: Payer: Self-pay | Admitting: Family

## 2017-06-08 ENCOUNTER — Encounter: Payer: Self-pay | Admitting: Internal Medicine

## 2017-06-08 NOTE — Telephone Encounter (Signed)
[06/08/2017 1:42 PM]  Selina Cooley:   Hey [place]  I have a quick question.    [06/08/2017 1:43 PM]  [email or web address]:   Hey! Ok whats up   [06/08/2017 1:43 PM]  Selina Cooley:   Patient Larry Davis is here just walked in with his wife. Do you remember a bill Kennyth Lose gave you? they said it was about a month ago.   [06/08/2017 1:44 PM]  [email or web address]:   From [facility]?   [06/08/2017 1:44 PM]  Selina Cooley:   Yes thats correct   [06/08/2017 1:44 PM]  [email or web address]:   Yes, I had it re-filled with a correct DX. He is getting another one?   [06/08/2017 1:45 PM]  Selina Cooley:   Yes  Invoice date is 03-19-2017   [06/08/2017 1:45 PM]  [email or web address]:   ahh geez ok, does he have it with him? can you make a copy so I can reach out to them again Oh 2-2 is before I had it fixed that might be why I had them re-submit on 2/12 so that may have been run before that   [06/08/2017 1:46 PM]  Selina Cooley:   Yes I have the copy now.    [06/08/2017 1:46 PM]  [email or web address]:   ok perfect I will touch base with them and see what is going on!   [06/08/2017 1:46 PM]  Selina Cooley:   [place]. Patient wife works at [employer]. and she is asking alot of upper questions    [06/08/2017 1:46 PM]  [email or web address]:   oh dang they have [facility] UMR?   [06/08/2017 1:47 PM]  Selina Cooley:   She wants to know who is over regional of [place] ? and there number ? Let me look at the card  one sec   [06/08/2017 1:47 PM]  [email or web address]:   ohh like for our insurance? or [facility]?   [06/08/2017 1:48 PM]  Selina Cooley:   Yes They have UMR.   [06/08/2017 1:48 PM]  [email or web address]:   OK who is she asking for the number for? Our office or the insurance or who?   [06/08/2017 1:48 PM]  Selina Cooley:   Regional Manager number.  I provided her with your card   [06/08/2017 1:49 PM]  [email or web address]:   OK  sounds good!   [06/08/2017 1:49 PM]  Selina Cooley:   She wants your email ?  Do I provide that ?   [06/08/2017 1:49 PM]  [email or web address]:   yeah that is fine   [06/08/2017 1:49 PM]  Selina Cooley:   Ok!   [06/08/2017 1:49 PM]  [email or web address]:   yeah you can   [06/08/2017 1:50 PM]  Selina Cooley:   Why did it go to [facility] > ? Thats there question they have ?   [06/08/2017 1:51 PM]  [email or web address]:   you may want to make sure you give her my direct number too, [phone] so she doesn't have to go thru the cost center because [facility] is who we are contracted with for Urine drug screens   [06/08/2017 1:51 PM]  Selina Cooley:   Ok Can I say that term to them ?   [06/08/2017 1:51 PM]  [email or web address]:   That is who our doctors choose   [06/08/2017 1:51 PM]  Selina Cooley:   [place]  So will he have to pay this bill?   [06/08/2017 1:52 PM]  [email or web address]:   I wouldnt mention the UDS part but you can say [facility] is who we are contracted with for certain services we are  working with them on a solution for UMR patients but we don't have it all worked out yet, we are trying to get a speacial/ cheaper rate since UMR won't cover the service at all   [06/08/2017 1:53 PM]  Toney Rakes, Hayes Ludwig:   [place]. So imform him he may have to pay.   [06/08/2017 1:53 PM]  [email or web address]:   but UMR won't cover that service no matter who we use, so even if we sent to labcopr they would deny that is the problem we are running into he may have to pay something yes, we are trying to get around billing them for the entire thing but we have to figure it out with our billing office too   [06/08/2017 1:54 PM]  [email or web address]:   Tell him I will send his info to our [facility] rep so they can hold it while we are working thru the details   [06/08/2017 1:55 PM]  [email or web address]:   sorry I would come up there but I am on a call  with another patient    [06/08/2017 1:55 PM]  Selina Cooley:   ok. she said she was going to email blyth    [06/08/2017 1:57 PM]  Selina Cooley:   Patient wants you to call his wife instead- of him. please.-- [name]- cell phone -[phone]   [06/08/2017 1:58 PM]  [email or web address]:   ok thanks!   [06/08/2017 1:58 PM]  Selina Cooley:   Welcome :)

## 2017-06-09 ENCOUNTER — Other Ambulatory Visit: Payer: Self-pay

## 2017-06-09 MED ORDER — GLUCOSE BLOOD VI STRP: ORAL_STRIP | 3 refills | Status: DC

## 2017-06-09 NOTE — Telephone Encounter (Signed)
UDS's are random but typically for low risk patients like him we check every 6 months.

## 2017-06-09 NOTE — Telephone Encounter (Signed)
Spoke with Vaughan Basta, Patients wife and answered questions in regards to patients bill. I will follow up with her when we know more about the way we will be handling these bills for [facility] employees going forward.   Wife would like to confirm when he will need another UDS for his Ambien refill, so they can be better prepared for the bill. Melissa, please advise and I will let Vaughan Basta know.

## 2017-06-10 NOTE — Telephone Encounter (Signed)
Pt's spouse has been notified. She voices understanding. States that she understands the policy but patient is not going to be happy about ongoing expense and he may try to find a different group for his health care. She will relay information to pt. I advised her that we would hate to see him leave but if he is able to find another group that will not require UDS he is welcome to transfer care and we understand his financial concern.

## 2017-06-10 NOTE — Telephone Encounter (Signed)
Noted  

## 2017-06-13 ENCOUNTER — Other Ambulatory Visit: Payer: Self-pay | Admitting: Cardiology

## 2017-06-13 ENCOUNTER — Other Ambulatory Visit: Payer: Self-pay

## 2017-06-13 ENCOUNTER — Other Ambulatory Visit: Payer: Self-pay | Admitting: Family

## 2017-06-13 ENCOUNTER — Encounter: Payer: Self-pay | Admitting: Internal Medicine

## 2017-06-13 MED ORDER — ASPIRIN EC 325 MG PO TBEC: 325.0000 mg | DELAYED_RELEASE_TABLET | Freq: Every day | ORAL | Status: DC

## 2017-06-13 MED ORDER — MELOXICAM 7.5 MG PO TABS: 7.5000 mg | ORAL_TABLET | Freq: Every day | ORAL | 0 refills | Status: DC

## 2017-06-13 MED ORDER — ASPIRIN EC 325 MG PO TBEC: 325.0000 mg | DELAYED_RELEASE_TABLET | Freq: Every day | ORAL | 3 refills | Status: DC

## 2017-06-13 MED ORDER — ZOLPIDEM TARTRATE 10 MG PO TABS: 10.0000 mg | ORAL_TABLET | Freq: Every evening | ORAL | 0 refills | Status: DC | PRN

## 2017-06-13 MED FILL — ASPIRIN EC 325 MG TABLET: 325 | 90 days supply | Qty: 90 | Fill #0

## 2017-06-13 MED FILL — INVOKANA 300 MG TABLET: 300 | 30 days supply | Qty: 30 | Fill #4

## 2017-06-13 MED FILL — JANUVIA 100 MG TABLET: 100 | 90 days supply | Qty: 90 | Fill #3

## 2017-06-13 NOTE — Telephone Encounter (Signed)
Meloxicam rx sent.  Will defer tramadol rx to ortho since they are now managing his shoulder pain. Refill printed for [facility]. He can pick up at front desk when he signs contract.

## 2017-06-13 NOTE — Telephone Encounter (Signed)
Last TRAMADOL RX: 05/27/17, #15 Last ZOLPIDEM RX: 02/22/17, #30 Last Meloxicam RX: 05/27/17, #30 Last OV: 05/02/17 Next OV: none scheduled UDS: 02/23/17 CSC: printed 02/25/17 but not signed. Contract is still at front desk for pt to complete. CSR: No discrepancies identified

## 2017-06-13 NOTE — Telephone Encounter (Signed)
Notified pt and he voices understanding. He will contact ortho for tramadol refill.  Rx placed at front desk with previous contract for pt to complete.

## 2017-06-14 ENCOUNTER — Encounter: Payer: Self-pay | Admitting: Internal Medicine

## 2017-06-15 ENCOUNTER — Other Ambulatory Visit: Payer: Self-pay

## 2017-06-15 ENCOUNTER — Telehealth: Payer: Self-pay | Admitting: Family

## 2017-06-15 MED ORDER — GLUCOSE BLOOD VI STRP: ORAL_STRIP | 6 refills | Status: DC

## 2017-06-15 MED ORDER — MELOXICAM 7.5 MG PO TABS: 7.5000 mg | ORAL_TABLET | Freq: Two times a day (BID) | ORAL | 0 refills | Status: DC

## 2017-06-15 MED FILL — traMADol HCL 50 MG TABS: 50 | 20 days supply | Qty: 20 | Fill #0

## 2017-06-15 MED FILL — FREESTYLE LITE TEST STRIP: 67 days supply | Qty: 200 | Fill #0

## 2017-06-15 NOTE — Telephone Encounter (Signed)
Pharmacy haven't received free style lite test strips,   [place], [place] - [address] [phone] (Phone) [phone] (Fax)

## 2017-06-15 NOTE — Telephone Encounter (Signed)
Received fax from pharmacy that pt is using meloxicam bid and needs greater quantity.

## 2017-06-16 ENCOUNTER — Other Ambulatory Visit: Payer: Self-pay

## 2017-06-16 MED ORDER — FREESTYLE LITE DEVI: 0 refills | Status: DC

## 2017-06-16 MED ORDER — FREESTYLE LANCETS MISC: 12 refills | Status: DC

## 2017-06-16 MED FILL — ZOLPIDEM TARTRATE 10 MG TAB: 10 | 30 days supply | Qty: 30 | Fill #0

## 2017-06-16 MED FILL — FREESTYLE LANCETS: 30 days supply | Qty: 100 | Fill #0

## 2017-06-16 MED FILL — FREESTYLE LITE METER: 30 days supply | Qty: 1 | Fill #0

## 2017-06-28 DIAGNOSIS — M19111 Post-traumatic osteoarthritis, right shoulder: Secondary | ICD-10-CM | POA: Diagnosis not present

## 2017-06-28 DIAGNOSIS — M25512 Pain in left shoulder: Secondary | ICD-10-CM | POA: Diagnosis not present

## 2017-06-28 DIAGNOSIS — M25511 Pain in right shoulder: Secondary | ICD-10-CM | POA: Diagnosis not present

## 2017-06-28 DIAGNOSIS — M19112 Post-traumatic osteoarthritis, left shoulder: Secondary | ICD-10-CM | POA: Diagnosis not present

## 2017-06-28 MED FILL — MELOXICAM 7.5 MG TABLET: 7.5 | 30 days supply | Qty: 30 | Fill #0

## 2017-07-05 ENCOUNTER — Telehealth: Payer: 59 | Admitting: Family

## 2017-07-05 DIAGNOSIS — J019 Acute sinusitis, unspecified: Secondary | ICD-10-CM | POA: Diagnosis not present

## 2017-07-05 DIAGNOSIS — B9689 Other specified bacterial agents as the cause of diseases classified elsewhere: Secondary | ICD-10-CM

## 2017-07-05 MED ORDER — AMOXICILLIN-POT CLAVULANATE 875-125 MG PO TABS: 1.0000 | ORAL_TABLET | Freq: Two times a day (BID) | ORAL | 0 refills | Status: DC

## 2017-07-05 MED FILL — AMOX-CLAV 875-125 MG TABLET: 875-125 | 7 days supply | Qty: 14 | Fill #0

## 2017-07-05 NOTE — Progress Notes (Signed)

## 2017-07-25 MED FILL — METFORMIN HCL ER 500 MG TAB: 500 | 90 days supply | Qty: 360 | Fill #2

## 2017-07-25 MED FILL — INVOKANA 300 MG TABLET: 300 | 30 days supply | Qty: 30 | Fill #5

## 2017-07-27 DIAGNOSIS — H524 Presbyopia: Secondary | ICD-10-CM | POA: Diagnosis not present

## 2017-07-27 DIAGNOSIS — E119 Type 2 diabetes mellitus without complications: Secondary | ICD-10-CM | POA: Diagnosis not present

## 2017-08-05 ENCOUNTER — Ambulatory Visit: Payer: 59 | Admitting: Internal Medicine

## 2017-08-12 ENCOUNTER — Telehealth: Payer: Self-pay | Admitting: *Deleted

## 2017-08-12 NOTE — Telephone Encounter (Signed)
Pt is due for 6 month follow up and 2nd shingrix vaccine. Left detailed message on pt's voicemail to scheduled f/u with Melissa to take care of both. [place] for [facility] / Triage to discuss and schedule with pt.

## 2017-08-30 DIAGNOSIS — Z7984 Long term (current) use of oral hypoglycemic drugs: Secondary | ICD-10-CM | POA: Diagnosis not present

## 2017-08-30 DIAGNOSIS — E785 Hyperlipidemia, unspecified: Secondary | ICD-10-CM | POA: Diagnosis not present

## 2017-08-30 DIAGNOSIS — E119 Type 2 diabetes mellitus without complications: Secondary | ICD-10-CM | POA: Diagnosis not present

## 2017-08-30 DIAGNOSIS — G4700 Insomnia, unspecified: Secondary | ICD-10-CM | POA: Diagnosis not present

## 2017-08-30 DIAGNOSIS — Z125 Encounter for screening for malignant neoplasm of prostate: Secondary | ICD-10-CM | POA: Diagnosis not present

## 2017-09-11 ENCOUNTER — Encounter: Payer: Self-pay | Admitting: Family

## 2017-09-12 ENCOUNTER — Other Ambulatory Visit: Payer: Self-pay | Admitting: Family

## 2017-09-12 MED ORDER — EPINEPHRINE 0.3 MG/0.3ML IJ SOAJ
0.3000 mg | Freq: Once | INTRAMUSCULAR | 1 refills | Status: DC
Start: 2017-09-12 — End: 2018-06-05

## 2017-09-12 MED ORDER — MELOXICAM 7.5 MG PO TABS: 7.5000 mg | ORAL_TABLET | Freq: Two times a day (BID) | ORAL | 0 refills | Status: DC

## 2017-09-12 MED ORDER — ZOLPIDEM TARTRATE 10 MG PO TABS: 10.0000 mg | ORAL_TABLET | Freq: Every evening | ORAL | 0 refills | Status: DC | PRN

## 2017-09-12 NOTE — Telephone Encounter (Signed)
I sent everything except tramadol- future refills will need to come from his orthopedist.

## 2017-09-12 NOTE — Telephone Encounter (Signed)
Last tramadol RX: 05/27/17, #15 Last zolpidem RX: 06/13/17, #30 Last OV:  05/02/17 acute visit Next OV: none scheduled UDS: 02/23/17 CSC: 06/23/17 CSR: Please review.  Tramadol Rx was given to pt on 06/15/17, #20 by Jenetta Loges, PA.

## 2017-09-13 MED FILL — ZOLPIDEM TARTRATE 10 MG TAB: 10 | 30 days supply | Qty: 30 | Fill #0

## 2017-09-13 MED FILL — MELOXICAM 7.5 MG TABLET: 7.5 | 30 days supply | Qty: 60 | Fill #0

## 2017-09-13 NOTE — Telephone Encounter (Signed)
Zolpidem printed and was faxed to pharmacy. Mychart message sent to pt.

## 2017-09-29 DIAGNOSIS — M19111 Post-traumatic osteoarthritis, right shoulder: Secondary | ICD-10-CM | POA: Diagnosis not present

## 2017-09-29 DIAGNOSIS — M19112 Post-traumatic osteoarthritis, left shoulder: Secondary | ICD-10-CM | POA: Diagnosis not present

## 2017-09-29 DIAGNOSIS — M25511 Pain in right shoulder: Secondary | ICD-10-CM | POA: Diagnosis not present

## 2017-09-29 MED FILL — CYCLOBENZAPRINE HCL 10 MG T: 10 | 30 days supply | Qty: 30 | Fill #0

## 2017-09-29 MED FILL — MELOXICAM 15 MG TABLET: 15 | 30 days supply | Qty: 30 | Fill #0

## 2017-10-10 ENCOUNTER — Other Ambulatory Visit: Payer: 59

## 2017-10-10 ENCOUNTER — Ambulatory Visit: Payer: 59 | Admitting: Family

## 2017-10-10 ENCOUNTER — Other Ambulatory Visit: Payer: Self-pay | Admitting: Family

## 2017-10-10 ENCOUNTER — Telehealth: Payer: Self-pay | Admitting: *Deleted

## 2017-10-10 ENCOUNTER — Ambulatory Visit: Payer: 59

## 2017-10-10 DIAGNOSIS — Z23 Encounter for immunization: Secondary | ICD-10-CM

## 2017-10-10 DIAGNOSIS — G4700 Insomnia, unspecified: Secondary | ICD-10-CM

## 2017-10-10 DIAGNOSIS — Z79899 Other long term (current) drug therapy: Secondary | ICD-10-CM

## 2017-10-10 NOTE — Progress Notes (Signed)
Pre visit review using our clinic tool,if applicable. No additional management support is needed unless otherwise documented below in the visit note.  

## 2017-10-10 NOTE — Telephone Encounter (Signed)
FYI: Pt came in to the office for nurse visit and was advised that lab appt for UDS was due. Pt states, "I am still fighting with my insurance over the last UDS bill and I am not going to have that test done". I advised pt that PCP may not be able to prescribe the zolpidem for him any longer and he states "that's fine, I can just get it somewhere else.  I'm not going to be able to pay $125 every time that test needs to be done."

## 2017-10-10 NOTE — Telephone Encounter (Signed)
Would he prefer to try trazodone instead of ambien (non-controlled substance and does not require UDS?) Most insurances cover the drug screens. We are working on trying to get a better rate for [facility] employees but I don't think that is in place yet.

## 2017-10-11 MED ORDER — TRAZODONE HCL 50 MG PO TABS: 25.0000 mg | ORAL_TABLET | Freq: Every evening | ORAL | 3 refills | Status: DC | PRN

## 2017-10-11 MED FILL — traZODone HCL 50 MG TABS: 50 | 30 days supply | Qty: 30 | Fill #0

## 2017-10-11 NOTE — Telephone Encounter (Signed)
rx has been sent for trazodone.

## 2017-10-11 NOTE — Telephone Encounter (Signed)
Pt aware.

## 2017-10-11 NOTE — Telephone Encounter (Signed)
Notified pt. He states he would like to try trazodone. He still has some ambien on hand and will switch to trazodone once he completes his current supply. Please send rx.

## 2017-10-12 ENCOUNTER — Encounter: Payer: Self-pay | Admitting: Allergy and Immunology

## 2017-10-12 ENCOUNTER — Ambulatory Visit: Payer: 59 | Admitting: Allergy and Immunology

## 2017-10-12 VITALS — BP 142/70 | HR 76 | Temp 98.5°F | Resp 16 | Ht 68.0 in | Wt 189.2 lb

## 2017-10-12 DIAGNOSIS — T7800XD Anaphylactic reaction due to unspecified food, subsequent encounter: Secondary | ICD-10-CM | POA: Diagnosis not present

## 2017-10-12 DIAGNOSIS — J3089 Other allergic rhinitis: Secondary | ICD-10-CM | POA: Diagnosis not present

## 2017-10-12 MED ORDER — AZELASTINE-FLUTICASONE 137-50 MCG/ACT NA SUSP: 1.0000 | Freq: Two times a day (BID) | NASAL | 5 refills | Status: DC

## 2017-10-12 MED FILL — ASPIRIN EC 325 MG TABLET: 325 | 90 days supply | Qty: 90 | Fill #1

## 2017-10-12 NOTE — Assessment & Plan Note (Signed)
   Laboratory form has been provided for serum specific IgE against alpha gal panel.  When lab results have returned the patient will be called with further recommendations and follow up instructions.  For now, continue careful avoidance of mammalian meat and have access to epinephrine autoinjectors in case of axonal ingestion followed by systemic symptoms.

## 2017-10-12 NOTE — Assessment & Plan Note (Signed)
Well-controlled.  Continue appropriate allergen avoidance measures and Dymista nasal spray as needed.  Nasal saline spray (i.e., Simply Saline) or nasal saline lavage (i.e., NeilMed) is recommended as needed and prior to medicated nasal sprays.

## 2017-10-12 NOTE — Patient Instructions (Signed)
Allergy with anaphylaxis due to food  Laboratory form has been provided for serum specific IgE against alpha gal panel.  When lab results have returned the patient will be called with further recommendations and follow up instructions.  For now, continue careful avoidance of mammalian meat and have access to epinephrine autoinjectors in case of axonal ingestion followed by systemic symptoms.  Perennial allergic rhinitis Well-controlled.  Continue appropriate allergen avoidance measures and Dymista nasal spray as needed.  Nasal saline spray (i.e., Simply Saline) or nasal saline lavage (i.e., NeilMed) is recommended as needed and prior to medicated nasal sprays.

## 2017-10-12 NOTE — Progress Notes (Signed)
Follow-up Note  RE: Larry Davis MRN: [id number] DOB: 1955-04-16 Date of Office Visit: 10/12/2017  Primary care provider: Debbrah Alar, NP Referring provider: Debbrah Alar, NP  History of present illness: Larry Davis is a 62 y.o. 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  He was previously seen in this clinic for his initial evaluation in January 2018.  In the interval since his previous visit he has carefully avoided mammalian meat and has not had an accidental ingestion.  He reports that, aside from sinus infection in March, his nasal allergy symptoms have been very well controlled with Dymista nasal spray.  Assessment and plan: Allergy with anaphylaxis due to food  Laboratory form has been provided for serum specific IgE against alpha gal panel.  When lab results have returned the patient will be called with further recommendations and follow up instructions.  For now, continue careful avoidance of mammalian meat and have access to epinephrine autoinjectors in case of axonal ingestion followed by systemic symptoms.  Perennial allergic rhinitis Well-controlled.  Continue appropriate allergen avoidance measures and Dymista nasal spray as needed.  Nasal saline spray (i.e., Simply Saline) or nasal saline lavage (i.e., NeilMed) is recommended as needed and prior to medicated nasal sprays.   Meds ordered this encounter  Medications  . Azelastine-Fluticasone (DYMISTA) 137-50 MCG/ACT SUSP    Sig: Place 1 spray into the nose 2 (two) times daily.    Dispense:  1 Bottle    Refill:  5    Diagnostics: A lab order form has been provided.    Physical examination: Blood pressure (!) 142/70, pulse 76, temperature 98.5 F (36.9 C), temperature source Oral, resp. rate 16, height 5\' 8"  (1.727 m), weight 189 lb 3.2 oz (85.8 kg), SpO2 95 %.  General: Alert, interactive, in no acute distress. HEENT: TMs pearly gray,  turbinates mildly edematous without discharge, post-pharynx mildly erythematous. Neck: Supple without lymphadenopathy. Lungs: Clear to auscultation without wheezing, rhonchi or rales. CV: Normal S1, S2 without murmurs. Skin: Warm and dry, without lesions or rashes.  The following portions of the patient's history were reviewed and updated as appropriate: allergies, current medications, past family history, past medical history, past social history, past surgical history and problem list.  Allergies as of 10/12/2017      Reactions   Bee Venom Anaphylaxis   Throat swells   Beef-derived Products Anaphylaxis   Metformin And Related    diarrhea   Other       Medication List        Accurate as of 10/12/17 11:44 AM. Always use your most recent med list.          aspirin EC 325 MG tablet Take 1 tablet (325 mg total) by mouth daily.   Azelastine-Fluticasone 137-50 MCG/ACT Susp Place 1 spray into the nose 2 (two) times daily.   canagliflozin 300 MG Tabs tablet Commonly known as:  INVOKANA TAKE 1 TABLET BY MOUTH DAILY BEFORE BREAKFAST.   EPINEPHrine 0.3 mg/0.3 mL Soaj injection Commonly known as:  EPI-PEN Inject 0.3 mLs (0.3 mg total) into the muscle once for 1 dose.   freestyle lancets Use to check blood sugars up to 3 times daily   FREESTYLE LITE Devi Use as directed   glipiZIDE 5 MG tablet Commonly known as:  GLUCOTROL Take 1 tablet (5 mg total) by mouth 2 (two) times daily before a meal.   glucose blood test strip Use to check blood sugars up to  3 times daily   meloxicam 7.5 MG tablet Commonly known as:  MOBIC Take 1 tablet (7.5 mg total) by mouth 2 (two) times daily.   metFORMIN 500 MG 24 hr tablet Commonly known as:  GLUCOPHAGE-XR Take 2 tablets (1,000 mg total) 2 (two) times daily with a meal by mouth.   sitaGLIPtin 100 MG tablet Commonly known as:  JANUVIA Take 1 tablet (100 mg total) daily by mouth.   traMADol 50 MG tablet Commonly known as:  ULTRAM Take  1 tablet (50 mg total) by mouth every 8 (eight) hours as needed.   traZODone 50 MG tablet Commonly known as:  DESYREL Take 0.5-1 tablets (25-50 mg total) by mouth at bedtime as needed for sleep.       Allergies  Allergen Reactions  . Bee Venom Anaphylaxis    Throat swells  . Beef-Derived Products Anaphylaxis  . Metformin And Related     diarrhea  . Other     I appreciate the opportunity to take part in Larry Davis's care. Please do not hesitate to contact me with questions.  Sincerely,   R. Edgar Frisk, MD

## 2017-10-14 MED FILL — FLUNISOLIDE 0.025% SPRAY: 25 MCG/ACT | 50 days supply | Qty: 25 | Fill #0

## 2017-10-18 ENCOUNTER — Telehealth: Payer: Self-pay | Admitting: Allergy and Immunology

## 2017-10-18 MED FILL — INVOKANA 300 MG TABLET: 300 | 30 days supply | Qty: 30 | Fill #6

## 2017-10-18 MED FILL — JANUVIA 100 MG TABLET: 100 | 30 days supply | Qty: 30 | Fill #0

## 2017-10-18 NOTE — Telephone Encounter (Signed)
Please advise 

## 2017-10-18 NOTE — Telephone Encounter (Signed)
Pt called asking for test results as he received a My Chart message that the results are in but he doesn't see them. Please call back with results. Thanks

## 2017-10-18 NOTE — Telephone Encounter (Signed)
Please inform the patient that I just checked and it appears that the alpha gal lab results have not returned yet.  It typically takes 2 weeks for the alpha gal results to come back.  Thanks.

## 2017-10-19 LAB — ALPHA-GAL PANEL
Alpha Gal IgE*: 64.2 kU/L — ABNORMAL HIGH (ref <0.10)
Beef (Bos spp) IgE: 5.21 kU/L — ABNORMAL HIGH (ref <0.35)
Class Interpretation: 2
Class Interpretation: 3
Lamb/Mutton (Ovis spp) IgE: 0.32 kU/L (ref <0.35)
Pork (Sus spp) IgE: 1.75 kU/L — ABNORMAL HIGH (ref <0.35)

## 2017-10-19 NOTE — Telephone Encounter (Signed)
Pt informed about lab results not being back yet.

## 2017-10-24 ENCOUNTER — Ambulatory Visit: Payer: 59 | Attending: Orthopedic Surgery | Admitting: Physical Therapy

## 2017-10-24 DIAGNOSIS — M25611 Stiffness of right shoulder, not elsewhere classified: Secondary | ICD-10-CM | POA: Insufficient documentation

## 2017-10-24 DIAGNOSIS — M25612 Stiffness of left shoulder, not elsewhere classified: Secondary | ICD-10-CM | POA: Insufficient documentation

## 2017-10-24 DIAGNOSIS — M25511 Pain in right shoulder: Secondary | ICD-10-CM | POA: Insufficient documentation

## 2017-10-24 DIAGNOSIS — M6281 Muscle weakness (generalized): Secondary | ICD-10-CM | POA: Insufficient documentation

## 2017-10-24 DIAGNOSIS — M25512 Pain in left shoulder: Secondary | ICD-10-CM | POA: Insufficient documentation

## 2017-10-24 DIAGNOSIS — R293 Abnormal posture: Secondary | ICD-10-CM | POA: Insufficient documentation

## 2017-10-24 DIAGNOSIS — G8929 Other chronic pain: Secondary | ICD-10-CM | POA: Insufficient documentation

## 2017-10-27 ENCOUNTER — Ambulatory Visit: Payer: 59 | Admitting: Physical Therapy

## 2017-10-27 ENCOUNTER — Other Ambulatory Visit: Payer: Self-pay

## 2017-10-27 DIAGNOSIS — M25512 Pain in left shoulder: Principal | ICD-10-CM

## 2017-10-27 DIAGNOSIS — R293 Abnormal posture: Secondary | ICD-10-CM | POA: Diagnosis not present

## 2017-10-27 DIAGNOSIS — M25612 Stiffness of left shoulder, not elsewhere classified: Secondary | ICD-10-CM

## 2017-10-27 DIAGNOSIS — M6281 Muscle weakness (generalized): Secondary | ICD-10-CM | POA: Diagnosis not present

## 2017-10-27 DIAGNOSIS — G8929 Other chronic pain: Secondary | ICD-10-CM | POA: Diagnosis not present

## 2017-10-27 DIAGNOSIS — M25611 Stiffness of right shoulder, not elsewhere classified: Secondary | ICD-10-CM | POA: Diagnosis not present

## 2017-10-27 DIAGNOSIS — M25511 Pain in right shoulder: Secondary | ICD-10-CM
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
## 2017-10-27 NOTE — Patient Instructions (Signed)

## 2017-10-28 DIAGNOSIS — N5231 Erectile dysfunction following radical prostatectomy: Secondary | ICD-10-CM | POA: Diagnosis not present

## 2017-11-01 ENCOUNTER — Encounter: Payer: Self-pay | Admitting: Physical Therapy

## 2017-11-01 ENCOUNTER — Ambulatory Visit: Payer: 59 | Admitting: Physical Therapy

## 2017-11-01 DIAGNOSIS — G8929 Other chronic pain: Secondary | ICD-10-CM | POA: Diagnosis not present

## 2017-11-01 DIAGNOSIS — M6281 Muscle weakness (generalized): Secondary | ICD-10-CM | POA: Diagnosis not present

## 2017-11-01 DIAGNOSIS — M25611 Stiffness of right shoulder, not elsewhere classified: Secondary | ICD-10-CM | POA: Diagnosis not present

## 2017-11-01 DIAGNOSIS — M25612 Stiffness of left shoulder, not elsewhere classified: Secondary | ICD-10-CM

## 2017-11-01 DIAGNOSIS — R293 Abnormal posture: Secondary | ICD-10-CM

## 2017-11-01 DIAGNOSIS — M25512 Pain in left shoulder: Secondary | ICD-10-CM | POA: Diagnosis not present

## 2017-11-01 DIAGNOSIS — M25511 Pain in right shoulder: Secondary | ICD-10-CM | POA: Diagnosis not present

## 2017-11-01 NOTE — Therapy (Signed)
[facility] [address]  [place], [place], [address] Phone: [phone]   Fax:  [phone]  Physical Therapy Treatment  Patient Details  Name: Larry Davis MRN: [id number] Date of Birth: 25-Nov-1955 Referring Provider: Jenetta Loges, PA for Justice Britain, MD   Encounter Date: 11/01/2017  PT End of Session - 11/01/17 0839    Visit Number  2    Number of Visits  8    Date for PT Re-Evaluation  12/08/17    Authorization Type  [facility]    PT Start Time  0839    PT Stop Time  0946    PT Time Calculation (min)  67 min    Activity Tolerance  Patient tolerated treatment well    Behavior During Therapy  [facility] for tasks assessed/performed       Past Medical History:  Diagnosis Date  . Allergy   . 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 Diabetes mellitus without complication ([place])   . PONV (postoperative nausea and vomiting)   . Prostate cancer ([place]) 03/2014  . Urticaria     Past Surgical History:  Procedure Laterality Date  . ADENOIDECTOMY    . CHOLECYSTECTOMY  2000  . LYMPHADENECTOMY Bilateral 06/14/2014   Procedure: PELVIC LYMPH NODE DISSECTION;  Surgeon: Alexis Frock, MD;  [facility]: [facility];  Service: Urology;  Laterality: Bilateral;  . ROBOT ASSISTED LAPAROSCOPIC RADICAL PROSTATECTOMY N/A 06/14/2014   Procedure: ROBOTIC ASSISTED LAPAROSCOPIC RADICAL PROSTATECTOMY WITH INDOCYANINE GREEN DYE INJECTION;  Surgeon: Alexis Frock, MD;  [facility];  Service: Urology;  Laterality: N/A;  . SHOULDER SURGERY Right 1990   arthroscopy  . SINOSCOPY    . TONSILLECTOMY      There were no vitals filed for this visit.  Subjective Assessment - 11/01/17 0841    Subjective  Pt reporting no difference from DN last visit.    Pertinent History  remote h/o R shoulder arthroscopy in [place] hold activities;Lifting    Patient Stated Goals  "reduction in pain and improved mobility"    Currently in  Pain?  Yes    Pain Score  5     Pain Location  Shoulder    Pain Orientation  Right    Pain Descriptors / Indicators  Sharp;Throbbing    Pain Onset  --   March 2019   Pain Score  8    Pain Location  Shoulder    Pain Orientation  Left    Pain Descriptors / Indicators  Sharp;Throbbing    Pain Onset  --   March 2019                      [facility] Adult PT Treatment/Exercise - 11/01/17 0839      Exercises   Exercises  Shoulder      Shoulder Exercises: Standing   Extension  Both;15 reps;Theraband;Strengthening    Theraband Level (Shoulder Extension)  Level 2 (Red)    Extension Limitations  cues for scap retraction & depression    Row  Both;15 reps;Theraband;Strengthening    Theraband Level (Shoulder Row)  Level 2 (Red)    Row Limitations  cues for scap retraction & depression      Shoulder Exercises: Pulleys   Flexion  2 minutes    Scaption  2 minutes      Shoulder Exercises: Stretch   Corner Stretch  30 seconds;1 rep   Advertising copywriter Limitations  3 way doorway stretch      Modalities   Modalities  Electrical Stimulation;Moist Heat      Moist Heat Therapy   Number Minutes Moist Heat  15 Minutes    Moist Heat Location  Shoulder   Left     Electrical Stimulation   Electrical Stimulation Location  L shoulder complex    Electrical Stimulation Action  IFC    Electrical Stimulation Parameters  80-150 Hz, intensity to pt tol x15'    Electrical Stimulation Goals  Pain;Tone      Manual Therapy   Manual Therapy  Soft tissue mobilization;Myofascial release;Scapular mobilization    Manual therapy comments  supine & R sidelying    Soft tissue mobilization  L pecs, ant/midlle deltoid, RTC musculature & teres group    Myofascial Release  manual TPR L pec major, ant deltoid, teres group & subscapularis    Scapular Mobilization  L scapula - all planes       Trigger Point Dry Needling - 11/01/17 0839    Consent Given?  Yes    Muscles Treated Upper Body   Subscapularis;Pectoralis major   Ant deltoid, Teres group & Lats on L shoulder   Pectoralis Major Response  Twitch response elicited;Palpable increased muscle length    Subscapularis Response  Twitch response elicited;Palpable increased muscle length           PT Education - 11/01/17 0930    Education Details  Initial HEP    Person(s) Educated  Patient    Methods  Explanation;Demonstration;Handout    Comprehension  Verbalized understanding;Returned demonstration;Need further instruction          PT Long Term Goals - 11/01/17 0930      PT LONG TERM GOAL #1   Title  Independent with ongoing HEP    Status  On-going      PT LONG TERM GOAL #2   Title  Pt will demostrate neutral shoulder posture  to reduce risk of RTC impingement with overhead activities    Status  On-going      PT LONG TERM GOAL #3   Title  B shoulder flexion & abduction AROM to >/= shoulder height w/o increased pain    Status  On-going      PT LONG TERM GOAL #4   Title  Pt will report ability to use B UE for basic ADLs and light household chores w/o increased pain    Status  On-going            Plan - 11/01/17 1236    Clinical Impression Statement  Continued to address ongoing increased muscle tension/taut bands and TPs t/o L shoulder complex with manual therapy including DN, with pt noting increased ease of donning/doffing T-shirt with less pain by end of session. Initial HEP targeting anterior stretching for neutral shoulder alignment and scapular strengthening/stabilization to promote improved glenohumeral mechanics with functional UE use.    Rehab Potential  Good    PT Treatment/Interventions  Patient/family education;Manual techniques;Dry needling;Passive range of motion;Taping;Neuromuscular re-education;Therapeutic exercise;Therapeutic activities;ADLs/Self Care Home Management;Electrical Stimulation;Cryotherapy;Vasopneumatic Device;Iontophoresis 4mg /ml Dexamethasone;Moist Heat;Ultrasound     Consulted and Agree with Plan of Care  Patient       Patient will benefit from skilled therapeutic intervention in order to improve the following deficits and impairments:  Pain, Increased muscle spasms, Impaired flexibility, Decreased range of motion, Decreased strength, Impaired UE functional use, Decreased activity tolerance  Visit Diagnosis: Chronic left shoulder pain  Chronic right shoulder pain  Stiffness of left  shoulder, not elsewhere classified  Stiffness of right shoulder, not elsewhere classified  Abnormal posture  Muscle weakness (generalized)     Problem List Patient Active Problem List   Diagnosis Date Noted  . Rotator cuff tear arthropathy 05/30/2017  . Abnormal electrocardiogram (ECG) (EKG) 03/14/2017  . Overweight (BMI 25.0-29.9) 09/23/2016  . 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 Shoulder joint pain 08/15/2015  . Hyperlipidemia 03/18/2015  . Prostate cancer ([place]) 05/12/2014  . Perennial allergic rhinitis 05/12/2014  . Insomnia 05/12/2014  . Type 2 diabetes mellitus with hyperglycemia, without long-term current use of insulin ([place]) 05/11/2014    Percival Spanish, PT, MPT 11/01/2017, 12:42 PM  [facility] [address]  [place], [place], [address] Phone: [phone]   Fax:  [phone]  Name: Larry Davis MRN: [id number] Date of Birth: 09/05/1955

## 2017-11-04 ENCOUNTER — Ambulatory Visit: Payer: 59 | Admitting: Physical Therapy

## 2017-11-04 ENCOUNTER — Encounter: Payer: Self-pay | Admitting: Physical Therapy

## 2017-11-04 DIAGNOSIS — M25512 Pain in left shoulder: Secondary | ICD-10-CM | POA: Diagnosis not present

## 2017-11-04 DIAGNOSIS — M25511 Pain in right shoulder: Secondary | ICD-10-CM | POA: Diagnosis not present

## 2017-11-04 DIAGNOSIS — M25611 Stiffness of right shoulder, not elsewhere classified: Secondary | ICD-10-CM

## 2017-11-04 DIAGNOSIS — R293 Abnormal posture: Secondary | ICD-10-CM

## 2017-11-04 DIAGNOSIS — M6281 Muscle weakness (generalized): Secondary | ICD-10-CM | POA: Diagnosis not present

## 2017-11-04 DIAGNOSIS — G8929 Other chronic pain: Secondary | ICD-10-CM | POA: Diagnosis not present

## 2017-11-04 DIAGNOSIS — M25612 Stiffness of left shoulder, not elsewhere classified: Secondary | ICD-10-CM | POA: Diagnosis not present

## 2017-11-04 NOTE — Therapy (Signed)
[facility] [address]  [place], [place], [address] Phone: [phone]   Fax:  [phone]  Physical Therapy Treatment  Patient Details  Name: Larry Davis MRN: [id number] Date of Birth: [birth year] Referring Provider: Jenetta Loges, PA for Justice Britain, MD   Encounter Date: 11/04/2017  PT End of Session - 11/04/17 0850    Visit Number  3    Number of Visits  8    Date for PT Re-Evaluation  12/08/17    Authorization Type  [facility]    PT Start Time  0850    PT Stop Time  0952    PT Time Calculation (min)  62 min    Activity Tolerance  Patient tolerated treatment well    Behavior During Therapy  [facility] for tasks assessed/performed       Past Medical History:  Diagnosis Date  . Allergy   . 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 Diabetes mellitus without complication ([place])   . PONV (postoperative nausea and vomiting)   . Prostate cancer ([place]) 03/2014  . Urticaria     Past Surgical History:  Procedure Laterality Date  . ADENOIDECTOMY    . CHOLECYSTECTOMY  2000  . LYMPHADENECTOMY Bilateral 06/14/2014   Procedure: PELVIC LYMPH NODE DISSECTION;  Surgeon: Alexis Frock, MD;  [facility];  Service: Urology;  Laterality: Bilateral;  . ROBOT ASSISTED LAPAROSCOPIC RADICAL PROSTATECTOMY N/A 06/14/2014   Procedure: ROBOTIC ASSISTED LAPAROSCOPIC RADICAL PROSTATECTOMY WITH INDOCYANINE GREEN DYE INJECTION;  Surgeon: Alexis Frock, MD;  [facility];  Service: Urology;  Laterality: N/A;  . SHOULDER SURGERY Right 1990   arthroscopy  . SINOSCOPY    . TONSILLECTOMY      There were no vitals filed for this visit.  Subjective Assessment - 11/04/17 0854    Subjective  Pt noting better mobility with R arm today - using arm more naturally with things like driving.    Pertinent History  remote h/o R shoulder arthroscopy in [place] hold activities;Lifting    Patient Stated Goals  "reduction  in pain and improved mobility"    Currently in Pain?  Yes    Pain Score  6     Pain Location  Shoulder    Pain Orientation  Right    Pain Descriptors / Indicators  Sharp;Throbbing    Pain Type  Acute pain;Chronic pain    Pain Onset  --   March 2019   Pain Frequency  Constant    Pain Onset  --   March 2019                      [facility] Adult PT Treatment/Exercise - 11/04/17 0850      Exercises   Exercises  Shoulder      Shoulder Exercises: ROM/Strengthening   UBE (Upper Arm Bike)  L1.5 x 6 min (3' fwd/3' back)      Modalities   Modalities  Electrical Stimulation;Moist Heat      Moist Heat Therapy   Number Minutes Moist Heat  15 Minutes    Moist Heat Location  Shoulder   Left     Electrical Stimulation   Electrical Stimulation Location  L shoulder complex    Electrical Stimulation Action  IFC    Electrical Stimulation Parameters  80-150 Hz, intensity to pt tol x15'    Electrical Stimulation Goals  Pain;Tone      Manual Therapy  Manual Therapy  Soft tissue mobilization;Myofascial release    Manual therapy comments  supine & R sidelying    Soft tissue mobilization  L pecs, ant/mid/post deltoid, RTC musculature & teres group    Myofascial Release  manual TPR L pec major, ant deltoid & supra/infraspinatus       Trigger Point Dry Needling - 11/04/17 0850    Consent Given?  Yes    Muscles Treated Upper Body  Pectoralis major;Supraspinatus;Infraspinatus   + ant & mid/post deltoid on L   Pectoralis Major Response  Twitch response elicited;Palpable increased muscle length    Supraspinatus Response  Twitch response elicited;Palpable increased muscle length    Infraspinatus Response  Twitch response elicited;Palpable increased muscle length                PT Long Term Goals - 11/01/17 0930      PT LONG TERM GOAL #1   Title  Independent with ongoing HEP    Status  On-going      PT LONG TERM GOAL #2   Title  Pt will demostrate neutral shoulder  posture  to reduce risk of RTC impingement with overhead activities    Status  On-going      PT LONG TERM GOAL #3   Title  B shoulder flexion & abduction AROM to >/= shoulder height w/o increased pain    Status  On-going      PT LONG TERM GOAL #4   Title  Pt will report ability to use B UE for basic ADLs and light household chores w/o increased pain    Status  On-going            Plan - 11/04/17 0858    Clinical Impression Statement  [place] noting improving ability to use L arm functionally with daily tasks such as driving. Overall muscle tension improving in L shoulder complex but continued isolated TPs identified in multiple muscles which were addressed with manual therapy and DN with good twitch response and palpable reduciton in muscle tension/tenderness. Pt looking into home estim unit for continued pain management as indicated.    Rehab Potential  Good    PT Treatment/Interventions  Patient/family education;Manual techniques;Dry needling;Passive range of motion;Taping;Neuromuscular re-education;Therapeutic exercise;Therapeutic activities;ADLs/Self Care Home Management;Electrical Stimulation;Cryotherapy;Vasopneumatic Device;Iontophoresis 4mg /ml Dexamethasone;Moist Heat;Ultrasound    Consulted and Agree with Plan of Care  Patient       Patient will benefit from skilled therapeutic intervention in order to improve the following deficits and impairments:  Pain, Increased muscle spasms, Impaired flexibility, Decreased range of motion, Decreased strength, Impaired UE functional use, Decreased activity tolerance  Visit Diagnosis: Chronic left shoulder pain  Chronic right shoulder pain  Stiffness of left shoulder, not elsewhere classified  Stiffness of right shoulder, not elsewhere classified  Abnormal posture  Muscle weakness (generalized)     Problem List Patient Active Problem List   Diagnosis Date Noted  . Rotator cuff tear arthropathy 05/30/2017  . Abnormal  electrocardiogram (ECG) (EKG) 03/14/2017  . Overweight (BMI 25.0-29.9) 09/23/2016  . 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 Shoulder joint pain 08/15/2015  . Hyperlipidemia 03/18/2015  . Prostate cancer ([place]) 05/12/2014  . Perennial allergic rhinitis 05/12/2014  . Insomnia 05/12/2014  . Type 2 diabetes mellitus with hyperglycemia, without long-term current use of insulin ([place]) 05/11/2014    Percival Spanish, PT, MPT 11/04/2017, 12:10 PM  [facility] [place]  [place], [place], [address] Phone: [phone]   Fax:  [phone]  Name: REMMINGTON URIETA MRN: [id number] Date of Birth: 1955-04-02

## 2017-11-08 ENCOUNTER — Encounter: Payer: Self-pay | Admitting: Physical Therapy

## 2017-11-08 ENCOUNTER — Ambulatory Visit: Payer: 59 | Admitting: Physical Therapy

## 2017-11-08 DIAGNOSIS — R293 Abnormal posture: Secondary | ICD-10-CM | POA: Diagnosis not present

## 2017-11-08 DIAGNOSIS — M25611 Stiffness of right shoulder, not elsewhere classified: Secondary | ICD-10-CM | POA: Diagnosis not present

## 2017-11-08 DIAGNOSIS — M25512 Pain in left shoulder: Principal | ICD-10-CM

## 2017-11-08 DIAGNOSIS — M6281 Muscle weakness (generalized): Secondary | ICD-10-CM | POA: Diagnosis not present

## 2017-11-08 DIAGNOSIS — M25511 Pain in right shoulder: Secondary | ICD-10-CM

## 2017-11-08 DIAGNOSIS — G8929 Other chronic pain: Secondary | ICD-10-CM

## 2017-11-08 DIAGNOSIS — M25612 Stiffness of left shoulder, not elsewhere classified: Secondary | ICD-10-CM

## 2017-11-08 MED FILL — METFORMIN HCL ER 500 MG TAB: 500 | 90 days supply | Qty: 360 | Fill #3

## 2017-11-08 NOTE — Therapy (Signed)
[facility] [address]  [place], [place], [address] Phone: [phone]   Fax:  [phone]  Physical Therapy Treatment  Patient Details  Name: Larry Davis MRN: [id number] Date of Birth: 11-Oct-1955 Referring Provider: Jenetta Loges, PA for Justice Britain, MD   Encounter Date: 11/08/2017  PT End of Session - 11/08/17 0850    Visit Number  4    Number of Visits  8    Date for PT Re-Evaluation  12/08/17    Authorization Type  [facility]    PT Start Time  0850    PT Stop Time  0958    PT Time Calculation (min)  68 min    Activity Tolerance  Patient tolerated treatment well    Behavior During Therapy  [facility] for tasks assessed/performed       Past Medical History:  Diagnosis Date  . Allergy   . 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 Diabetes mellitus without complication ([place])   . PONV (postoperative nausea and vomiting)   . Prostate cancer ([place]) 03/2014  . Urticaria     Past Surgical History:  Procedure Laterality Date  . ADENOIDECTOMY    . CHOLECYSTECTOMY  2000  . LYMPHADENECTOMY Bilateral 06/14/2014   Procedure: PELVIC LYMPH NODE DISSECTION;  Surgeon: Alexis Frock, MD;  Location: [facility];  Service: Urology;  Laterality: Bilateral;  . ROBOT ASSISTED LAPAROSCOPIC RADICAL PROSTATECTOMY N/A 06/14/2014   Procedure: ROBOTIC ASSISTED LAPAROSCOPIC RADICAL PROSTATECTOMY WITH INDOCYANINE GREEN DYE INJECTION;  Surgeon: Alexis Frock, MD;  Location: [facility];  Service: Urology;  Laterality: N/A;  . SHOULDER SURGERY Right 1990   arthroscopy  . SINOSCOPY    . TONSILLECTOMY      There were no vitals filed for this visit.  Subjective Assessment - 11/08/17 0851    Subjective  Pt reporting he woke up with increased L shoulder pain today - was a little sore when he went to bed and may have slept on it wrong but otherwise unable to identify triggering event.    Pertinent History  remote h/o R shoulder arthroscopy in  [place] hold activities;Lifting    Patient Stated Goals  "reduction in pain and improved mobility"    Currently in Pain?  Yes    Pain Score  8     Pain Location  Shoulder    Pain Orientation  Left    Pain Descriptors / Indicators  Sharp;Throbbing    Pain Type  Acute pain;Chronic pain    Pain Onset  --   March 2019   Pain Frequency  Constant    Pain Score  4    Pain Location  Shoulder    Pain Orientation  Right    Pain Descriptors / Indicators  Sharp;Throbbing    Pain Type  Acute pain;Chronic pain    Pain Onset  --   March 2019   Pain Frequency  Constant                       [facility] Adult PT Treatment/Exercise - 11/08/17 0850      Exercises   Exercises  Shoulder      Shoulder Exercises: ROM/Strengthening   UBE (Upper Arm Bike)  L1.0 x 6 min (3' fwd/3' back)      Modalities   Modalities  Electrical engineer Stimulation Location  L shoulder complex  Electrical Stimulation Action  IFC    Electrical Stimulation Parameters  80-150 Hz, intensity to pt tol x15'    Electrical Stimulation Goals  Pain;Tone      Manual Therapy   Manual Therapy  Soft tissue mobilization;Myofascial release;Taping    Manual therapy comments  supine & R sidelying    Soft tissue mobilization  B pecs & ant/mid/post deltoid; L RTC musculature & teres group    Myofascial Release  manual TPR R pec major & B ant/mid deltoid    Kinesiotex  Inhibit Muscle      Kinesiotix   Inhibit Muscle   B shoulder impingement pattern - Y strip 30% along superior & inferior scapular spine + Y strip 30% for deltoid inhibition       Trigger Point Dry Needling - 11/08/17 0850    Consent Given?  Yes    Muscles Treated Upper Body  Pectoralis major   on R; B anterior deltoid & L teres group   Pectoralis Major Response  Twitch response elicited;Palpable increased muscle length           PT Education - 11/08/17 0932    Education Details   Role of kinesiotaping & wearing instructions    Person(s) Educated  Patient    Methods  Explanation;Demonstration    Comprehension  Verbalized understanding          PT Long Term Goals - 11/01/17 0930      PT LONG TERM GOAL #1   Title  Independent with ongoing HEP    Status  On-going      PT LONG TERM GOAL #2   Title  Pt will demostrate neutral shoulder posture  to reduce risk of RTC impingement with overhead activities    Status  On-going      PT LONG TERM GOAL #3   Title  B shoulder flexion & abduction AROM to >/= shoulder height w/o increased pain    Status  On-going      PT LONG TERM GOAL #4   Title  Pt will report ability to use B UE for basic ADLs and light household chores w/o increased pain    Status  On-going            Plan - 11/08/17 0854    Clinical Impression Statement  Silvestre continues to report improvement in functional movement of L UE with PT interventions, but notes less change in pain levels. Treatment continued to address pain and abnormal muscle tension in L shoulder as this is still his greatest complaint, but also increased attention to R shoulder today with pt noting reduction in B shoulder pain after this. Initiated trial of kinesiotaping following manual therapy and DN today in efforts to promote more lasting benefit of reduced pain and muscle tension - will assess response on next visit.    Rehab Potential  Good    PT Treatment/Interventions  Patient/family education;Manual techniques;Dry needling;Passive range of motion;Taping;Neuromuscular re-education;Therapeutic exercise;Therapeutic activities;ADLs/Self Care Home Management;Electrical Stimulation;Cryotherapy;Vasopneumatic Device;Iontophoresis 4mg /ml Dexamethasone;Moist Heat;Ultrasound    Consulted and Agree with Plan of Care  Patient       Patient will benefit from skilled therapeutic intervention in order to improve the following deficits and impairments:  Pain, Increased muscle spasms,  Impaired flexibility, Decreased range of motion, Decreased strength, Impaired UE functional use, Decreased activity tolerance  Visit Diagnosis: Chronic left shoulder pain  Chronic right shoulder pain  Stiffness of left shoulder, not elsewhere classified  Stiffness of right shoulder, not elsewhere classified  Abnormal posture  Muscle weakness (generalized)     Problem List Patient Active Problem List   Diagnosis Date Noted  . Rotator cuff tear arthropathy 05/30/2017  . Abnormal electrocardiogram (ECG) (EKG) 03/14/2017  . Overweight (BMI 25.0-29.9) 09/23/2016  . 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 Shoulder joint pain 08/15/2015  . Hyperlipidemia 03/18/2015  . Prostate cancer ([place]) 05/12/2014  . Perennial allergic rhinitis 05/12/2014  . Insomnia 05/12/2014  . Type 2 diabetes mellitus with hyperglycemia, without long-term current use of insulin ([place]) 05/11/2014    Percival Spanish, PT, MPT 11/08/2017, 12:29 PM  [facility] [address]  [place], [place], [address] Phone: [phone]   Fax:  [phone]  Name: ZEKI BEDROSIAN MRN: [id number] Date of Birth: [birth year]

## 2017-11-18 MED FILL — DICLOFENAC SODIUM 1% GEL: 1 | 83 days supply | Qty: 500 | Fill #1

## 2017-11-18 MED FILL — JANUVIA 100 MG TABLET: 100 | 30 days supply | Qty: 30 | Fill #1

## 2017-11-18 MED FILL — INVOKANA 300 MG TABLET: 300 | 30 days supply | Qty: 30 | Fill #7

## 2017-11-22 ENCOUNTER — Ambulatory Visit: Payer: 59 | Attending: Orthopedic Surgery | Admitting: Physical Therapy

## 2017-11-22 DIAGNOSIS — M25611 Stiffness of right shoulder, not elsewhere classified: Secondary | ICD-10-CM | POA: Insufficient documentation

## 2017-11-22 DIAGNOSIS — M25612 Stiffness of left shoulder, not elsewhere classified: Secondary | ICD-10-CM | POA: Insufficient documentation

## 2017-11-22 DIAGNOSIS — M25512 Pain in left shoulder: Secondary | ICD-10-CM | POA: Insufficient documentation

## 2017-11-22 DIAGNOSIS — G8929 Other chronic pain: Secondary | ICD-10-CM | POA: Insufficient documentation

## 2017-11-22 DIAGNOSIS — M25511 Pain in right shoulder: Secondary | ICD-10-CM | POA: Insufficient documentation

## 2017-11-22 DIAGNOSIS — R293 Abnormal posture: Secondary | ICD-10-CM | POA: Insufficient documentation

## 2017-11-22 DIAGNOSIS — M6281 Muscle weakness (generalized): Secondary | ICD-10-CM | POA: Insufficient documentation

## 2017-11-24 ENCOUNTER — Encounter: Payer: Self-pay | Admitting: Physical Therapy

## 2017-11-24 ENCOUNTER — Ambulatory Visit: Payer: 59 | Admitting: Physical Therapy

## 2017-11-24 DIAGNOSIS — M25611 Stiffness of right shoulder, not elsewhere classified: Secondary | ICD-10-CM

## 2017-11-24 DIAGNOSIS — G8929 Other chronic pain: Secondary | ICD-10-CM

## 2017-11-24 DIAGNOSIS — R293 Abnormal posture: Secondary | ICD-10-CM | POA: Diagnosis not present

## 2017-11-24 DIAGNOSIS — M25511 Pain in right shoulder: Secondary | ICD-10-CM | POA: Diagnosis not present

## 2017-11-24 DIAGNOSIS — M25512 Pain in left shoulder: Principal | ICD-10-CM

## 2017-11-24 DIAGNOSIS — M6281 Muscle weakness (generalized): Secondary | ICD-10-CM | POA: Diagnosis not present

## 2017-11-24 DIAGNOSIS — M25612 Stiffness of left shoulder, not elsewhere classified: Secondary | ICD-10-CM | POA: Diagnosis not present

## 2017-11-24 NOTE — Therapy (Signed)
[facility] [address]  [place], [place], [address] Phone: [phone]   Fax:  [phone]  Physical Therapy Treatment  Patient Details  Name: Larry Davis MRN: [id number] Date of Birth: [birth year] Referring Provider (PT): Jenetta Loges, [place] for Justice Britain, MD   Encounter Date: 11/24/2017  PT End of Session - 11/24/17 0850    Visit Number  5    Number of Visits  8    Date for PT Re-Evaluation  12/08/17    Authorization Type  [facility]    PT Start Time  0850    PT Stop Time  0933    PT Time Calculation (min)  43 min    Activity Tolerance  Patient tolerated treatment well    Behavior During Therapy  [facility] for tasks assessed/performed       Past Medical History:  Diagnosis Date  . Allergy   . 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 Diabetes mellitus without complication ([place])   . PONV (postoperative nausea and vomiting)   . Prostate cancer ([place]) 03/2014  . Urticaria     Past Surgical History:  Procedure Laterality Date  . ADENOIDECTOMY    . CHOLECYSTECTOMY  2000  . LYMPHADENECTOMY Bilateral 06/14/2014   Procedure: PELVIC LYMPH NODE DISSECTION;  Surgeon: Alexis Frock, MD;  [facility];  Service: Urology;  Laterality: Bilateral;  . ROBOT ASSISTED LAPAROSCOPIC RADICAL PROSTATECTOMY N/A 06/14/2014   Procedure: ROBOTIC ASSISTED LAPAROSCOPIC RADICAL PROSTATECTOMY WITH INDOCYANINE GREEN DYE INJECTION;  Surgeon: Alexis Frock, MD;  [facility];  Service: Urology;  Laterality: N/A;  . SHOULDER SURGERY Right 1990   arthroscopy  . SINOSCOPY    . TONSILLECTOMY      There were no vitals filed for this visit.  Subjective Assessment - 11/24/17 0851    Subjective  Pt reporting pain back to where it started, but unclear as to what is aggravating it - does not feel like he is doing anything different - if anything, he feels like ke is doing less. Did return from traveling to [place], [place] followed by DC  earlier this week.    Pertinent History  remote h/o R shoulder arthroscopy in [place] hold activities;Lifting    Patient Stated Goals  "reduction in pain and improved mobility"    Currently in Pain?  Yes    Pain Score  8     Pain Location  Shoulder    Pain Orientation  Left    Pain Descriptors / Indicators  Sharp    Pain Type  Acute pain;Chronic pain    Pain Onset  --   March 2019   Pain Frequency  Constant    Multiple Pain Sites  No    Pain Onset  --   March 2019                      [facility] Adult PT Treatment/Exercise - 11/24/17 0850      Exercises   Exercises  Shoulder      Shoulder Exercises: ROM/Strengthening   UBE (Upper Arm Bike)  L1.0 x 6 min (3' fwd/3' back)      Manual Therapy   Manual Therapy  Soft tissue mobilization;Myofascial release    Manual therapy comments  supine & prone    Soft tissue mobilization  L pecs, ant/mid/post deltoid, RTC musculature & teres group    Myofascial Release  manual TPR L pec major, ant  deltoid & supra/infraspinatus       Trigger Point Dry Needling - 11/24/17 0850    Consent Given?  Yes    Muscles Treated Upper Body  Pectoralis major;Infraspinatus   + anterior deltoid & teres group on L   Pectoralis Major Response  Twitch response elicited;Palpable increased muscle length    Infraspinatus Response  Twitch response elicited;Palpable increased muscle length                PT Long Term Goals - 11/01/17 0930      PT LONG TERM GOAL #1   Title  Independent with ongoing HEP    Status  On-going      PT LONG TERM GOAL #2   Title  Pt will demostrate neutral shoulder posture  to reduce risk of RTC impingement with overhead activities    Status  On-going      PT LONG TERM GOAL #3   Title  B shoulder flexion & abduction AROM to >/= shoulder height w/o increased pain    Status  On-going      PT LONG TERM GOAL #4   Title  Pt will report ability to use B UE for basic ADLs and light household  chores w/o increased pain    Status  On-going            Plan - 11/24/17 0855    Clinical Impression Statement  [name] returning to PT after >2 wk absence due to traveling, reporting L shoulder pain back to as bad as it was at baseline with PT, without known MOI. He reports pain limits him from raising his arm to do thisngs such as place coffee cup in microwave and notes awareness of increased tightness and ttp along anterior shoulder and upper arm with significant taut bands & TPs identified - addressed with manual therapy including DN with pt reporting improved ability to raise his arm by end of session. Pt will be f/u with orthopedic surgeon next week and anticipates they will proceed with scheduling reverese TSA.    Rehab Potential  Good    PT Treatment/Interventions  Patient/family education;Manual techniques;Dry needling;Passive range of motion;Taping;Neuromuscular re-education;Therapeutic exercise;Therapeutic activities;ADLs/Self Care Home Management;Electrical Stimulation;Cryotherapy;Vasopneumatic Device;Iontophoresis 4mg /ml Dexamethasone;Moist Heat;Ultrasound    PT Next Visit Plan  MD progress note    Consulted and Agree with Plan of Care  Patient       Patient will benefit from skilled therapeutic intervention in order to improve the following deficits and impairments:  Pain, Increased muscle spasms, Impaired flexibility, Decreased range of motion, Decreased strength, Impaired UE functional use, Decreased activity tolerance  Visit Diagnosis: Chronic left shoulder pain  Chronic right shoulder pain  Stiffness of left shoulder, not elsewhere classified  Stiffness of right shoulder, not elsewhere classified  Abnormal posture  Muscle weakness (generalized)     Problem List Patient Active Problem List   Diagnosis Date Noted  . Rotator cuff tear arthropathy 05/30/2017  . Abnormal electrocardiogram (ECG) (EKG) 03/14/2017  . Overweight (BMI 25.0-29.9) 09/23/2016  . Allergy  with anaphylaxis due to food 02/25/2016  . 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 Shoulder joint pain 08/15/2015  . Hyperlipidemia 03/18/2015  . Prostate cancer ([place]) 05/12/2014  . Perennial allergic rhinitis 05/12/2014  . Insomnia 05/12/2014  . Type 2 diabetes mellitus with hyperglycemia, without long-term current use of insulin ([place]) 05/11/2014    Percival Spanish, PT, MPT 11/24/2017, 12:21 PM  [facility] [address]  [address]  [place], [place], [address] Phone: [phone]   Fax:  [phone]  Name: SHONDELL POULSON MRN: [id number] Date of Birth: 08-05-1955

## 2017-11-25 ENCOUNTER — Encounter: Payer: 59 | Admitting: Physical Therapy

## 2017-11-30 ENCOUNTER — Ambulatory Visit: Payer: 59 | Admitting: Physical Therapy

## 2017-11-30 ENCOUNTER — Encounter: Payer: Self-pay | Admitting: Physical Therapy

## 2017-11-30 DIAGNOSIS — M25511 Pain in right shoulder: Secondary | ICD-10-CM

## 2017-11-30 DIAGNOSIS — R293 Abnormal posture: Secondary | ICD-10-CM | POA: Diagnosis not present

## 2017-11-30 DIAGNOSIS — M25612 Stiffness of left shoulder, not elsewhere classified: Secondary | ICD-10-CM

## 2017-11-30 DIAGNOSIS — M25512 Pain in left shoulder: Principal | ICD-10-CM

## 2017-11-30 DIAGNOSIS — M6281 Muscle weakness (generalized): Secondary | ICD-10-CM

## 2017-11-30 DIAGNOSIS — G8929 Other chronic pain: Secondary | ICD-10-CM

## 2017-11-30 DIAGNOSIS — M25611 Stiffness of right shoulder, not elsewhere classified: Secondary | ICD-10-CM | POA: Diagnosis not present

## 2017-11-30 MED FILL — MELOXICAM 15 MG TABLET: 15 | 30 days supply | Qty: 30 | Fill #1

## 2017-11-30 MED FILL — traZODone HCL 50 MG TABS: 50 | 30 days supply | Qty: 30 | Fill #1

## 2017-11-30 MED FILL — CYCLOBENZAPRINE HCL 10 MG T: 10 | 30 days supply | Qty: 30 | Fill #0

## 2017-11-30 NOTE — Therapy (Addendum)
[facility] [address]  [place], [place], [address] Phone: [phone]   Fax:  [phone]  Physical Therapy Treatment  Patient Details  Name: Larry Davis MRN: [id number] Date of Birth: [birth year] Referring Provider (PT): Jenetta Loges, [place] for Justice Britain, MD   Encounter Date: 11/30/2017  PT End of Session - 11/30/17 0850    Visit Number  6    Number of Visits  8    Date for PT Re-Evaluation  12/08/17    Authorization Type  [facility]    PT Start Time  0850    PT Stop Time  0937    PT Time Calculation (min)  47 min    Activity Tolerance  Patient tolerated treatment well    Behavior During Therapy  [facility] for tasks assessed/performed       Past Medical History:  Diagnosis Date  . Allergy   . 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 Diabetes mellitus without complication ([place])   . PONV (postoperative nausea and vomiting)   . Prostate cancer ([place]) 03/2014  . Urticaria     Past Surgical History:  Procedure Laterality Date  . ADENOIDECTOMY    . CHOLECYSTECTOMY  2000  . LYMPHADENECTOMY Bilateral 06/14/2014   Procedure: PELVIC LYMPH NODE DISSECTION;  Surgeon: Alexis Frock, MD;  [facility];  Service: Urology;  Laterality: Bilateral;  . ROBOT ASSISTED LAPAROSCOPIC RADICAL PROSTATECTOMY N/A 06/14/2014   Procedure: ROBOTIC ASSISTED LAPAROSCOPIC RADICAL PROSTATECTOMY WITH INDOCYANINE GREEN DYE INJECTION;  Surgeon: Alexis Frock, MD;  [facility];  Service: Urology;  Laterality: N/A;  . SHOULDER SURGERY Right 1990   arthroscopy  . SINOSCOPY    . TONSILLECTOMY      There were no vitals filed for this visit.  Subjective Assessment - 11/30/17 0851    Subjective  Pt reporting pain lessened after manual therapy and DN last session but minimal change in functional use of arm other than sleeping more bearable after DN.    Pertinent History  remote h/o R shoulder arthroscopy in [place] hold activities;Lifting    Patient Stated Goals  "reduction in pain and improved mobility"    Currently in Pain?  Yes    Pain Score  6     Pain Location  Shoulder    Pain Orientation  Left    Pain Descriptors / Indicators  Sharp    Pain Type  Acute pain;Chronic pain    Pain Radiating Towards  L upper arm to elbow    Pain Onset  --   March 2019   Pain Frequency  Constant    Aggravating Factors   any movement above shoulder height; sleeping on L side    Pain Relieving Factors  prescription & OTC meds; muscle relaxants; topical analgesics    Effect of Pain on Daily Activities  limits any activity above shoulder height    Pain Score  3    Pain Location  Shoulder    Pain Orientation  Right    Pain Descriptors / Indicators  Sore    Pain Type  Acute pain;Chronic pain    Pain Radiating Towards  intermittent R UE to elbow    Pain Onset  --   March 2019   Pain Frequency  Constant    Aggravating Factors   overhead use of arm; sleeping    Pain Relieving Factors  prescription & OTC meds; muscle relaxants; topical analgesics  Effect of Pain on Daily Activities  limits any activity above shoulder height         [facility] PT Assessment - 11/30/17 0850      Assessment   Medical Diagnosis  B rotator cuff arthropathy & post-traumatic shoulder OA    Referring Provider (PT)  Jenetta Loges, PA for Justice Britain, MD    Onset Date/Surgical Date  --   March 2019   Hand Dominance  Right    Next MD Visit  12/02/17      AROM   Right Shoulder Flexion  147 Degrees    Right Shoulder ABduction  132 Degrees   onset of pain at ~90 dg   Right Shoulder Internal Rotation  --   FIR to T12   Right Shoulder External Rotation  --   FER to T3   Left Shoulder Flexion  82 Degrees   onset of pain at ~60 dg   Left Shoulder ABduction  85 Degrees   onset of pain at ~60 dg   Left Shoulder Internal Rotation  --   FIR to L2   Left Shoulder External Rotation  --   FER - unable     PROM   Right Shoulder  Flexion  160 Degrees    Right Shoulder ABduction  155 Degrees    Right Shoulder Internal Rotation  81 Degrees    Right Shoulder External Rotation  65 Degrees    Left Shoulder Flexion  142 Degrees    Left Shoulder ABduction  136 Degrees    Left Shoulder Internal Rotation  82 Degrees    Left Shoulder External Rotation  63 Degrees      Strength   Right Shoulder Flexion  3+/5    Right Shoulder ABduction  4-/5    Right Shoulder Internal Rotation  4+/5    Right Shoulder External Rotation  3-/5    Left Shoulder Flexion  2+/5    Left Shoulder ABduction  2+/5    Left Shoulder Internal Rotation  4/5    Left Shoulder External Rotation  2/5                   [facility] Adult PT Treatment/Exercise - 11/30/17 0850      Exercises   Exercises  Shoulder      Shoulder Exercises: Seated   Flexion  Left;AAROM;5 reps    Flexion Limitations  table slides with towel      Shoulder Exercises: Standing   Flexion  Right;Left;AAROM;5 reps    Flexion Limitations  wall climbs & wall slides with towel    Extension  Both;15 reps;Theraband;Strengthening    Theraband Level (Shoulder Extension)  Level 3 (Green)    Extension Limitations  cues for scap retraction & depression (avoiding shoulder elevation/shrug) as well as slower pace and increased hold time to ~3-5 sec    Row  Both;15 reps;Theraband;Strengthening    Theraband Level (Shoulder Row)  Level 3 (Green)    Row Limitations  cues for scap retraction & depression as well as slower pace and increased hold time to ~3-5 sec      Shoulder Exercises: ROM/Strengthening   UBE (Upper Arm Bike)  L1.0 x 6 min (3' fwd/3' back)      Shoulder Exercises: Isometric Strengthening   Flexion  5X5"    Extension  5X5"    External Rotation  5X5"    Internal Rotation  5X5"    ABduction  5X5"  PT Education - 11/30/17 0936    Education Details  HEP update - shoulder isometrics + progression of theraband to green for rows    Person(s) Educated   Patient    Methods  Explanation;Demonstration;Handout    Comprehension  Verbalized understanding;Returned demonstration          PT Long Term Goals - 11/30/17 0916      PT LONG TERM GOAL #1   Title  Independent with ongoing HEP    Status  Partially Met      PT LONG TERM GOAL #2   Title  Pt will demostrate neutral shoulder posture  to reduce risk of RTC impingement with overhead activities    Status  Partially Met      PT LONG TERM GOAL #3   Title  B shoulder flexion & abduction AROM to >/= shoulder height w/o increased pain    Status  Partially Met   met for R shoulder     PT LONG TERM GOAL #4   Title  Pt will report ability to use B UE for basic ADLs and light household chores w/o increased pain    Status  Partially Met            Plan - 11/30/17 0918    Clinical Impression Statement  Piercen noting reduction in overall B shoulder pain with PT interventions as well as improved functional use of R shoulder, however L shoulder remains significantly limited due to pain and loss of motion. Gains noted in both PROM and AROM of B shoulders with R shoulder PROM now WNL and AROM now [facility] although some pain still noted near end ROM, as well as L shoduler PROM WFL. B shoulder strength still limited, L > R. All goals at least partially met at this time. Pt anticipating he will require reverse L TSA, but would like to hold off on R shoulder surgery - recommend continued PT to maximize B shoulder PROM and strength pre-operatively to promote functional use of R shoulder as well as aide with rehab of L shoulder post-operatively.    Rehab Potential  Good    PT Treatment/Interventions  Patient/family education;Manual techniques;Dry needling;Passive range of motion;Taping;Neuromuscular re-education;Therapeutic exercise;Therapeutic activities;ADLs/Self Care Home Management;Electrical Stimulation;Cryotherapy;Vasopneumatic Device;Iontophoresis 55m/ml Dexamethasone;Moist Heat;Ultrasound    PT Next  Visit Plan  MD progress note    Consulted and Agree with Plan of Care  Patient       Patient will benefit from skilled therapeutic intervention in order to improve the following deficits and impairments:  Pain, Increased muscle spasms, Impaired flexibility, Decreased range of motion, Decreased strength, Impaired UE functional use, Decreased activity tolerance  Visit Diagnosis: Chronic left shoulder pain  Chronic right shoulder pain  Stiffness of left shoulder, not elsewhere classified  Stiffness of right shoulder, not elsewhere classified  Abnormal posture  Muscle weakness (generalized)     Problem List Patient Active Problem List   Diagnosis Date Noted  . Rotator cuff tear arthropathy 05/30/2017  . Abnormal electrocardiogram (ECG) (EKG) 03/14/2017  . Overweight (BMI 25.0-29.9) 09/23/2016  . 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 Shoulder joint pain 08/15/2015  . Hyperlipidemia 03/18/2015  . Prostate cancer ([place] 05/12/2014  . Perennial allergic rhinitis 05/12/2014  . Insomnia 05/12/2014  . Type 2 diabetes mellitus with hyperglycemia, without long-term current use of insulin ([place] 05/11/2014    JPercival Spanish PT, MPT 11/30/2017, 10:05 AM  [facility]  [address]  [place], [place], [address] Phone: [phone]   Fax:  [phone]  Name: EMETT STAPEL MRN: [id number] Date of Birth: 1956/01/11

## 2017-12-02 ENCOUNTER — Encounter: Payer: 59 | Admitting: Physical Therapy

## 2017-12-02 DIAGNOSIS — M19111 Post-traumatic osteoarthritis, right shoulder: Secondary | ICD-10-CM | POA: Diagnosis not present

## 2017-12-02 DIAGNOSIS — M19112 Post-traumatic osteoarthritis, left shoulder: Secondary | ICD-10-CM | POA: Diagnosis not present

## 2017-12-02 DIAGNOSIS — M25511 Pain in right shoulder: Secondary | ICD-10-CM | POA: Diagnosis not present

## 2017-12-02 DIAGNOSIS — M25512 Pain in left shoulder: Secondary | ICD-10-CM | POA: Diagnosis not present

## 2017-12-06 ENCOUNTER — Ambulatory Visit: Payer: 59 | Admitting: Physical Therapy

## 2017-12-06 ENCOUNTER — Encounter: Payer: Self-pay | Admitting: Physical Therapy

## 2017-12-06 DIAGNOSIS — M25611 Stiffness of right shoulder, not elsewhere classified: Secondary | ICD-10-CM | POA: Diagnosis not present

## 2017-12-06 DIAGNOSIS — R293 Abnormal posture: Secondary | ICD-10-CM | POA: Diagnosis not present

## 2017-12-06 DIAGNOSIS — M25512 Pain in left shoulder: Secondary | ICD-10-CM

## 2017-12-06 DIAGNOSIS — M25511 Pain in right shoulder: Secondary | ICD-10-CM | POA: Diagnosis not present

## 2017-12-06 DIAGNOSIS — M25612 Stiffness of left shoulder, not elsewhere classified: Secondary | ICD-10-CM | POA: Diagnosis not present

## 2017-12-06 DIAGNOSIS — G8929 Other chronic pain: Secondary | ICD-10-CM

## 2017-12-06 DIAGNOSIS — M6281 Muscle weakness (generalized): Secondary | ICD-10-CM

## 2017-12-06 NOTE — Therapy (Addendum)
[facility] [place] [address]  [place], [place], [address] Phone: [phone]   Fax:  [phone]  Physical Therapy Treatment  Patient Details  Name: Larry Davis MRN: [id number] Date of Birth: [birth year] Referring Provider (PT): Jenetta Loges, [place] for Justice [place], MD   Encounter Date: 12/06/2017  PT End of Session - 12/06/17 0847    Visit Number  7    Number of Visits  15    Date for PT Re-Evaluation  01/06/18    Authorization Type  [facility]    PT Start Time  0847    PT Stop Time  0945    PT Time Calculation (min)  58 min    Activity Tolerance  Patient tolerated treatment well    Behavior During Therapy  [facility] for tasks assessed/performed       Past Medical History:  Diagnosis Date  . Allergy   . 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 Diabetes mellitus without complication ([place])   . PONV (postoperative nausea and vomiting)   . Prostate cancer ([place]) 03/2014  . Urticaria     Past Surgical History:  Procedure Laterality Date  . ADENOIDECTOMY    . CHOLECYSTECTOMY  2000  . LYMPHADENECTOMY Bilateral 06/14/2014   Procedure: PELVIC LYMPH NODE DISSECTION;  Surgeon: Alexis Frock, MD;  Location: [facility];  Service: Urology;  Laterality: Bilateral;  . ROBOT ASSISTED LAPAROSCOPIC RADICAL PROSTATECTOMY N/A 06/14/2014   Procedure: ROBOTIC ASSISTED LAPAROSCOPIC RADICAL PROSTATECTOMY WITH INDOCYANINE GREEN DYE INJECTION;  Surgeon: Alexis Frock, MD;  Location: [facility];  Service: Urology;  Laterality: N/A;  . SHOULDER SURGERY Right 1990   arthroscopy  . SINOSCOPY    . TONSILLECTOMY      There were no vitals filed for this visit.  Subjective Assessment - 12/06/17 0850    Subjective  Pt saw MD who told him the only way to take care of the pain in the L shoulder is to proceed with reverse TSA - pt stating they are planning to do surgery the first week of December or sooner. MD ok with him continuing PT for pain  management as needed in the meantime.    Pertinent History  remote h/o R shoulder arthroscopy in [place] hold activities;Lifting    Patient Stated Goals  "reduction in pain and improved mobility"    Currently in Pain?  Yes    Pain Score  6     Pain Location  Shoulder    Pain Orientation  Left    Pain Descriptors / Indicators  Sharp    Pain Type  Acute pain;Chronic pain    Pain Onset  --   March 2019   Pain Frequency  Constant    Pain Score  3    Pain Location  Shoulder    Pain Orientation  Right    Pain Descriptors / Indicators  Sore    Pain Type  Acute pain;Chronic pain    Pain Onset  --   March 2019   Pain Frequency  Constant         [facility] PT Assessment - 12/06/17 0847      Assessment   Medical Diagnosis  B rotator cuff arthropathy & post-traumatic shoulder OA    Referring Provider (PT)  Jenetta Loges, [place] for Justice Britain, MD    Onset Date/Surgical Date  --   March 2019   Hand Dominance  Right      Prior Function  Level of Independence  Independent    Vocation  Retired    Leisure  work-out 5x/wk - currently only lower body & cardio      AROM   Overall AROM Comments  measurements as of 11/30/17    Right Shoulder Flexion  147 Degrees    Right Shoulder ABduction  132 Degrees   onset of pain at ~90 dg   Right Shoulder Internal Rotation  --   FIR to T12   Right Shoulder External Rotation  --   FER to T3   Left Shoulder Flexion  82 Degrees   onset of pain at ~60 dg   Left Shoulder ABduction  85 Degrees   onset of pain at ~60 dg   Left Shoulder Internal Rotation  --   FIR to L2   Left Shoulder External Rotation  --   FER - unable     PROM   Overall PROM Comments  measurements as of 11/30/17    Right Shoulder Flexion  160 Degrees    Right Shoulder ABduction  155 Degrees    Right Shoulder Internal Rotation  81 Degrees    Right Shoulder External Rotation  65 Degrees    Left Shoulder Flexion  142 Degrees    Left Shoulder ABduction  136  Degrees    Left Shoulder Internal Rotation  82 Degrees    Left Shoulder External Rotation  63 Degrees      Strength   Overall Strength Comments  measurements as of 11/30/17    Right Shoulder Flexion  3+/5    Right Shoulder ABduction  4-/5    Right Shoulder Internal Rotation  4+/5    Right Shoulder External Rotation  3-/5    Left Shoulder Flexion  2+/5    Left Shoulder ABduction  2+/5    Left Shoulder Internal Rotation  4/5    Left Shoulder External Rotation  2/5                   [facility] Adult PT Treatment/Exercise - 12/06/17 0847      Exercises   Exercises  Shoulder      Shoulder Exercises: Supine   Flexion  Right;10 reps;AROM    Flexion Limitations  PT assist for scapular retraction & depression      Shoulder Exercises: ROM/Strengthening   UBE (Upper Arm Bike)  L1.5 x 6 min (3' fwd/3' back)      Modalities   Modalities  Electrical Stimulation;Moist Heat      Moist Heat Therapy   Number Minutes Moist Heat  15 Minutes    Moist Heat Location  Shoulder   Rt     Electrical Stimulation   Electrical Stimulation Location  R shoulder complex    Electrical Stimulation Action  IFC    Electrical Stimulation Parameters  80-150 Hz, intensity to pt tol x15'    Electrical Stimulation Goals  Pain;Tone      Manual Therapy   Manual Therapy  Joint mobilization;Soft tissue mobilization;Myofascial release;Muscle Energy Technique    Manual therapy comments  supine    Joint Mobilization  R shoulder grade II-III inferior & A/P glides    Soft tissue mobilization  R pecs, ant/mid/post deltoid & teres group    Myofascial Release  manual TPR R pec major, ant deltoid, teres group & subscapularis    Muscle Energy Technique  contract/relax for R shoulder IR/ER        Trigger Point Dry Needling - 12/06/17 0847    Consent  Given?  Yes    Muscles Treated Upper Body  Pectoralis major  + anterior deltoid & teres group on R   Pectoralis Major Response  Twitch response  elicited;Palpable increased muscle length                PT Long Term Goals - 12/06/17 0855      PT LONG TERM GOAL #1   Title  Independent with ongoing HEP    Status  Partially Met    Target Date  01/06/18      PT LONG TERM GOAL #2   Title  Pt will demostrate neutral shoulder posture to reduce risk of RTC impingement with overhead activities    Status  Partially Met    Target Date  01/06/18      PT LONG TERM GOAL #3   Title  R shoulder flexion & abduction AROM WFL w/o increased pain    Status  Revised    Target Date  01/06/18      PT LONG TERM GOAL #4   Title  Pt will report ability to use R UE for basic ADLs and light household chores w/o increased pain    Status  Revised    Target Date  01/06/18            Plan - 12/06/17 0859    Clinical Impression Statement  Larry Davis reporting plan to proceed with L reverse TSA, hopefully by the first week in December, but would to continue PT for pain management and R shoulder rehab until surgery. Pt reporting good comfort with HEP and denies need for review/update today. Treatment focusing on manual therapy including DN for increased muscle tension/taut bands/TPs in R shoulder complex, esp pec major, anterior/middle deltoid and teres group/lats - good twitch response elicited with DN with decreased muscle tension and improved PROM noted following manual therapy. AROM remains more limited due to weakness and use of substitution but improved with PT facilitation of scapular movement. Recommend continuing PT 1-2x/wk x 4 weeks for R shoulder ROM & strenghtening as well as B shoulder pain management.    Rehab Potential  Good    PT Frequency  2x / week   1-2x/wk   PT Duration  4 weeks    PT Treatment/Interventions  Patient/family education;Manual techniques;Dry needling;Passive range of motion;Taping;Neuromuscular re-education;Therapeutic exercise;Therapeutic activities;ADLs/Self Care Home Management;Electrical  Stimulation;Cryotherapy;Vasopneumatic Device;Iontophoresis 27m/ml Dexamethasone;Moist Heat;Ultrasound    PT Next Visit Plan  --    Consulted and Agree with Plan of Care  Patient       Patient will benefit from skilled therapeutic intervention in order to improve the following deficits and impairments:  Pain, Increased muscle spasms, Impaired flexibility, Decreased range of motion, Decreased strength, Impaired UE functional use, Decreased activity tolerance  Visit Diagnosis: Chronic right shoulder pain  Stiffness of right shoulder, not elsewhere classified  Abnormal posture  Muscle weakness (generalized)  Chronic left shoulder pain  Stiffness of left shoulder, not elsewhere classified     Problem List Patient Active Problem List   Diagnosis Date Noted  . Rotator cuff tear arthropathy 05/30/2017  . Abnormal electrocardiogram (ECG) (EKG) 03/14/2017  . Overweight (BMI 25.0-29.9) 09/23/2016  . 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 Shoulder joint pain 08/15/2015  . Hyperlipidemia 03/18/2015  . [place]state cancer ([place] 05/12/2014  . Perennial allergic rhinitis 05/12/2014  . Insomnia 05/12/2014  . Type 2 diabetes mellitus with hyperglycemia, without long-term current use of insulin ([facility])  05/11/2014    Percival Spanish, PT, MPT 12/06/2017, 12:28 PM  [facility] [address]  [place], [place], [address] Phone: [phone]   Fax:  [phone]  Name: MANLEY FASON MRN: [id number] Date of Birth: [birth year]

## 2017-12-09 ENCOUNTER — Encounter: Payer: 59 | Admitting: Physical Therapy

## 2017-12-15 ENCOUNTER — Ambulatory Visit: Payer: 59 | Admitting: Physical Therapy

## 2017-12-15 ENCOUNTER — Encounter: Payer: Self-pay | Admitting: Physical Therapy

## 2017-12-15 DIAGNOSIS — M25612 Stiffness of left shoulder, not elsewhere classified: Secondary | ICD-10-CM | POA: Diagnosis not present

## 2017-12-15 DIAGNOSIS — M25611 Stiffness of right shoulder, not elsewhere classified: Secondary | ICD-10-CM | POA: Diagnosis not present

## 2017-12-15 DIAGNOSIS — R293 Abnormal posture: Secondary | ICD-10-CM | POA: Diagnosis not present

## 2017-12-15 DIAGNOSIS — M25511 Pain in right shoulder: Secondary | ICD-10-CM | POA: Diagnosis not present

## 2017-12-15 DIAGNOSIS — M25512 Pain in left shoulder: Secondary | ICD-10-CM | POA: Diagnosis not present

## 2017-12-15 DIAGNOSIS — G8929 Other chronic pain: Secondary | ICD-10-CM | POA: Diagnosis not present

## 2017-12-15 DIAGNOSIS — M6281 Muscle weakness (generalized): Secondary | ICD-10-CM

## 2017-12-15 NOTE — Therapy (Addendum)
[facility] [address]  [place], [place], [address] Phone: [phone]   Fax:  [phone]  Physical Therapy Treatment / Discharge Summary  Patient Details  Name: Larry Davis MRN: [id number] Date of Birth: [birth year] Referring Provider (PT): Jenetta Loges, [place] for Justice Britain, MD   Encounter Date: 12/15/2017  PT End of Session - 12/15/17 1612    Visit Number  8    Number of Visits  15    Date for PT Re-Evaluation  01/06/18    Authorization Type  [facility]    PT Start Time  1612    PT Stop Time  1700    PT Time Calculation (min)  48 min    Activity Tolerance  Patient tolerated treatment well    Behavior During Therapy  [facility] [place] [facility] for tasks assessed/performed       Past Medical History:  Diagnosis Date  . Allergy   . 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 Diabetes mellitus without complication ([place])   . PONV (postoperative nausea and vomiting)   . Prostate cancer ([place]) 03/2014  . Urticaria     Past Surgical History:  Procedure Laterality Date  . ADENOIDECTOMY    . CHOLECYSTECTOMY  2000  . LYMPHADENECTOMY Bilateral 06/14/2014   Procedure: PELVIC LYMPH NODE DISSECTION;  Surgeon: Alexis Frock, MD;  Location: [facility];  Service: Urology;  Laterality: Bilateral;  . ROBOT ASSISTED LAPAROSCOPIC RADICAL PROSTATECTOMY N/A 06/14/2014   Procedure: ROBOTIC ASSISTED LAPAROSCOPIC RADICAL PROSTATECTOMY WITH INDOCYANINE GREEN DYE INJECTION;  Surgeon: Alexis Frock, MD;  Location: [facility];  Service: Urology;  Laterality: N/A;  . SHOULDER SURGERY Right 1990   arthroscopy  . SINOSCOPY    . TONSILLECTOMY      There were no vitals filed for this visit.  Subjective Assessment - 12/15/17 1614    Subjective  Pt reporting L reverse TSA scheduled for 01/19/18, but may happen sooner if there is any opening in the surgery schedule.    Pertinent History  remote h/o R shoulder arthroscopy in [place] hold  activities;Lifting    Patient Stated Goals  "reduction in pain and improved mobility"    Currently in Pain?  Yes    Pain Score  9     Pain Location  Shoulder    Pain Orientation  Left    Pain Descriptors / Indicators  Throbbing    Pain Type  Acute pain;Chronic pain    Pain Onset  --   March 2019   Pain Score  6    Pain Location  Shoulder    Pain Orientation  Right    Pain Descriptors / Indicators  Throbbing    Pain Type  Acute pain;Chronic pain    Pain Onset  --   March 2019                      [facility] Adult PT Treatment/Exercise - 12/15/17 1612      Exercises   Exercises  Shoulder      Shoulder Exercises: ROM/Strengthening   UBE (Upper Arm Bike)  L1.5 x 6 min (3' fwd/3' back)      Manual Therapy   Manual Therapy  Joint mobilization;Soft tissue mobilization;Myofascial release    Manual therapy comments  supine    Joint Mobilization  R shoulder grade II-III inferior & A/P glides    Soft tissue mobilization  B pecs, ant/mid/post deltoid & teres group  Myofascial Release  manual TPR B pec major, ant & lat deltoid, teres group & subscapularis       Trigger Point Dry Needling - 12/15/17 1612    Consent Given?  Yes    Muscles Treated Upper Body  --   B anterior & lateral deltoid: + twtich response               PT Long Term Goals - 12/06/17 0855      PT LONG TERM GOAL #1   Title  Independent with ongoing HEP    Status  Partially Met    Target Date  01/06/18      PT LONG TERM GOAL #2   Title  Pt will demostrate neutral shoulder posture to reduce risk of RTC impingement with overhead activities    Status  Partially Met    Target Date  01/06/18      PT LONG TERM GOAL #3   Title  R shoulder flexion & abduction AROM WFL w/o increased pain    Status  Revised    Target Date  01/06/18      PT LONG TERM GOAL #4   Title  Pt will report ability to use R UE for basic ADLs and light household chores w/o increased pain    Status  Revised    Target  Date  01/06/18            Plan - 12/15/17 1700    Clinical Impression Statement  Larry Davis reporting shoulders had been doing well since last session pain wise until significant increase in B shoulder pain starting yesterday w/o known triggering event other than potentially related to inclement weather, with pain interferring with sleep and impacting use of both arms with ADLs and daily tasks today. Increased muscle tension with several taut bands noted t/o B shoulder complex which was addressed with manual therapy including STM, MFR and DN as well as joint mobs for pain relief. Pt reporting throbbing pain in B shoulders subsided by end of session.    Rehab Potential  Good    PT Frequency  2x / week   1-2x/wk   PT Duration  4 weeks    PT Treatment/Interventions  Patient/family education;Manual techniques;Dry needling;Passive range of motion;Taping;Neuromuscular re-education;Therapeutic exercise;Therapeutic activities;ADLs/Self Care Home Management;Electrical Stimulation;Cryotherapy;Vasopneumatic Device;Iontophoresis 54m/ml Dexamethasone;Moist Heat;Ultrasound    Consulted and Agree with Plan of Care  Patient       Patient will benefit from skilled therapeutic intervention in order to improve the following deficits and impairments:  Pain, Increased muscle spasms, Impaired flexibility, Decreased range of motion, Decreased strength, Impaired UE functional use, Decreased activity tolerance  Visit Diagnosis: Chronic right shoulder pain  Stiffness of right shoulder, not elsewhere classified  Abnormal posture  Muscle weakness (generalized)  Chronic left shoulder pain  Stiffness of left shoulder, not elsewhere classified     Problem List Patient Active Problem List   Diagnosis Date Noted  . Rotator cuff tear arthropathy 05/30/2017  . Abnormal electrocardiogram (ECG) (EKG) 03/14/2017  . Overweight (BMI 25.0-29.9) 09/23/2016  . 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 Allergy  to insect stings 02/25/2016  . Shoulder joint pain 08/15/2015  . Hyperlipidemia 03/18/2015  . [place]state cancer ([place] 05/12/2014  . Perennial allergic rhinitis 05/12/2014  . Insomnia 05/12/2014  . Type 2 diabetes mellitus with hyperglycemia, without long-term current use of insulin ([place] 05/11/2014    JPercival Spanish PT, MPT 12/15/2017, 6:49 PM  [facility]  [address]  [address] [place], [place], [address] Phone: [phone]   Fax:  [phone]  Name: Larry Davis MRN: [id number] Date of Birth: 08/01/1955  PHYSICAL THERAPY DISCHARGE SUMMARY  Visits from Start of Care: 8  Current functional level related to goals / functional outcomes:   Refer to above clinical impression for status as of last visit on 12/15/17. As of MD appt on 12/02/17, pt reports MD told him the only way to take care of the pain in the L shoulder is to proceed with reverse TSA with surgery scheduled for 01/19/18, but MD was ok with him continuing PT for pain management as needed in the meantime. Pt returned for 2 visits, but has not returned in >30 days, therefore will proceed with discharge from PT for this episode.   Remaining deficits:   As above - Unable to formally assess status at discharge due to failure to return to PT.   Education / Equipment:   HEP  Plan: Patient agrees to discharge.  Patient goals were partially met. Patient is being discharged due to not returning since the last visit.  ?????     JoAnne M. Kreis, PT, MPT 01/24/18, 10:27 AM  [facility] [address]  [address] [place], [place], [address] Phone: [phone]   Fax:  [phone]    

## 2017-12-27 ENCOUNTER — Ambulatory Visit: Payer: 59 | Admitting: Internal Medicine

## 2017-12-27 ENCOUNTER — Other Ambulatory Visit: Payer: Self-pay | Admitting: Internal Medicine

## 2017-12-27 MED FILL — JANUVIA 100 MG TABLET: 100 | 30 days supply | Qty: 30 | Fill #0

## 2017-12-27 MED FILL — INVOKANA 300 MG TABLET: 300 | 30 days supply | Qty: 30 | Fill #0

## 2017-12-27 NOTE — Telephone Encounter (Signed)
[place] patient  

## 2017-12-27 NOTE — Telephone Encounter (Signed)
Patient is out of town this week and currently in his car driving, he will send [place] a Pharmacist, community message with his availability.

## 2017-12-27 NOTE — Telephone Encounter (Signed)
Please advise, last OV March 2019. Does not have OV made with [place].

## 2017-12-27 NOTE — Telephone Encounter (Signed)
Can he come today at 3:30? Or 4 pm?

## 2017-12-27 NOTE — Progress Notes (Deleted)
Patient ID: Larry Davis, male   DOB: 22-Sep-1955, 61 y.o.   MRN: [id number]   HPI: Larry Davis is a 62 y.o.-year-old male, returning for f/u for DM2, dx in ~2008, non-insulin-dependent, uncontrolled, without long term complications. Last visit 8 months ago.  Last hemoglobin A1c was: Lab Results  Component Value Date   HGBA1C 6.4 05/04/2017   HGBA1C 7.1 12/24/2016   HGBA1C 6.9 09/23/2016   Pt is on a regimen of: - Metformin ER 1000 mg 2x daily with meals - Glipizide 5 mg before breakfast and dinner - added 04/2017 - Invokana 300 mg daily in am - Januvia 100 mg daily in am He tried Metformin R and ER >> diarrhea.  Pt checks his sugars once a day: - am:  150-160s, 180 >> 120, 145-150 >> 135-145 - 2h after b'fast: n/c - before lunch: 150-160s >> 145-150 >> 135-145 - 2h after lunch: n/c - before dinner: n/c >> 150-160s >> 150-160 >> n/c - 2h after dinner: n/c >> 160 - bedtime: n/c - nighttime: n/c Lowest sugar was 120 >> 101; he has hypoglycemia awareness at 100. Highest sugar was 185 (mexican foods) >> 190 >> ***.  Glucometer: AccuChek  Pt's meals are: 45-60 g carbs per meal, 50-60% vegetarian: - Breakfast: coffee + oatmeal or bagel, fruit - Lunch: salad + sandwich or leftovers from dinner  - Dinner: chicken and fish + vegetables + starch - Snacks: pretzels at night; frozen custard He saw nutrition in the past.  No fast food.  No regular sodas.  No sweet drinks.  He is walking 3 times a week:: 3 miles on the treadmill in the morning  -No CKD, last BUN/creatinine:  Lab Results  Component Value Date   BUN 16 02/23/2017   BUN 15 03/16/2016   CREATININE 0.90 02/23/2017   CREATININE 0.93 03/16/2016   -+ HL; Last set of lipids: Lab Results  Component Value Date   CHOL 166 03/14/2017   HDL 41.70 03/14/2017   LDLCALC 96 03/14/2017   LDLDIRECT 142.0 11/13/2014   TRIG 142.0 03/14/2017   CHOLHDL 4 03/14/2017  On atorvastatin. - last eye exam was  in 02/2016[place]  [facility]) -Denies numbness and tingling in his feet.  ROS: Constitutional: no weight gain/no weight loss, no fatigue, no subjective hyperthermia, no subjective hypothermia Eyes: no blurry vision, no xerophthalmia ENT: no sore throat, no nodules palpated in neck, no dysphagia, no odynophagia, no hoarseness Cardiovascular: no CP/no SOB/no palpitations/no leg swelling Respiratory: no cough/no SOB/no wheezing Gastrointestinal: no N/no V/no D/no C/no acid reflux Musculoskeletal: no muscle aches/no joint aches Skin: no rashes, no hair loss Neurological: no tremors/no numbness/no tingling/no dizziness  I reviewed pt's medications, allergies, PMH, social hx, family hx, and changes were documented in the history of present illness. Otherwise, unchanged from my initial visit note.  Past Medical History:  Diagnosis Date  . Allergy   . 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 Diabetes mellitus without complication ([place])   . PONV (postoperative nausea and vomiting)   . Prostate cancer ([place]) 03/2014  . Urticaria    Past Surgical History:  Procedure Laterality Date  . ADENOIDECTOMY    . CHOLECYSTECTOMY  2000  . LYMPHADENECTOMY Bilateral 06/14/2014   Procedure: PELVIC LYMPH NODE DISSECTION;  Surgeon: Alexis Frock, MD;  Location: [facility];  Service: Urology;  Laterality: Bilateral;  . ROBOT ASSISTED LAPAROSCOPIC RADICAL PROSTATECTOMY N/A 06/14/2014   Procedure: ROBOTIC ASSISTED LAPAROSCOPIC RADICAL PROSTATECTOMY WITH INDOCYANINE GREEN DYE INJECTION;  Surgeon: Hubbard Robinson  Tresa Moore, MD;  Location: [facility];  Service: Urology;  Laterality: N/A;  . SHOULDER SURGERY Right 1990   arthroscopy  . SINOSCOPY    . TONSILLECTOMY     Social History   Social History  . Marital status: Married    Spouse name: N/A   Social History Main Topics  . Smoking status: Former Research scientist (life sciences)  . Smokeless tobacco: Never Used  . Alcohol use No  . Drug use: No  . Sexual activity: Not on file   Other Topics Concern  . 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 aspirin EC 325 MG tablet Take 1 tablet (325 mg total) by mouth daily. 90 tablet 3  . Azelastine-Fluticasone (DYMISTA) 137-50 MCG/ACT SUSP Place 1 spray into the nose 2 (two) times daily. 1 Bottle 5  . Blood Glucose Monitoring Suppl (FREESTYLE LITE) DEVI Use as directed 1 each 0  . canagliflozin (INVOKANA) 300 MG TABS tablet TAKE 1 TABLET BY MOUTH DAILY BEFORE BREAKFAST. 30 tablet 11  . EPINEPHrine 0.3 mg/0.3 mL IJ SOAJ injection Inject 0.3 mLs (0.3 mg total) into the muscle once for 1 dose. 2 Device 1  . glipiZIDE (GLUCOTROL) 5 MG tablet Take 1 tablet (5 mg total) by mouth 2 (two) times daily before a meal. 30 tablet 5  . glucose blood (FREESTYLE LITE) test strip Use to check blood sugars up to 3 times daily 300 each 6  . Lancets (FREESTYLE) lancets Use to check blood sugars up to 3 times daily 100 each 12  . meloxicam (MOBIC) 7.5 MG tablet Take 1 tablet (7.5 mg total) by mouth 2 (two) times daily. 60 tablet 0  . metFORMIN (GLUCOPHAGE-XR) 500 MG 24 hr tablet Take 2 tablets (1,000 mg total) 2 (two) times daily with a meal by mouth. 360 tablet 3  . sitaGLIPtin (JANUVIA) 100 MG tablet Take 1 tablet (100 mg total) daily by mouth. 90 tablet 3  . traMADol (ULTRAM) 50 MG tablet Take 1 tablet (50 mg total) by mouth every 8 (eight) hours as needed. 15 tablet 0  . traZODone (DESYREL) 50 MG tablet Take 0.5-1 tablets (25-50 mg total) by mouth at bedtime as needed for sleep. 30 tablet 3   No current facility-administered medications on file prior to visit.    Allergies  Allergen Reactions  . Bee Venom Anaphylaxis    Throat swells  . Beef-Derived Products  Anaphylaxis  . Metformin And Related     diarrhea  . Other    Family History  Problem Relation Age of Onset  . Heart disease Mother   . Diabetes Mother   . Arthritis Mother   . Hypertension Mother   . Heart disease Father   . Hypertension Father   . Diabetes Father   . Arthritis Father   . Cancer Maternal Uncle        prostate  . Cancer Maternal Grandmother        cervical   PE: There were no vitals taken for this visit. Wt Readings from Last 3 Encounters:  10/12/17 189 lb 3.2 oz (85.8 kg)  05/04/17 196 lb 12.8 oz (89.3  kg)  05/02/17 193 lb 6.4 oz (87.7 kg)   Constitutional: overweight, in NAD Eyes: PERRLA, EOMI, no exophthalmos ENT: moist mucous membranes, no thyromegaly, no cervical lymphadenopathy Cardiovascular: RRR, No MRG Respiratory: CTA B Gastrointestinal: abdomen soft, NT, ND, BS+ Musculoskeletal: no deformities, strength intact in all 4 Skin: moist, warm, no rashes Neurological: no tremor with outstretched hands, DTR normal in all 4  ASSESSMENT: 1. DM2, non-insulin-dependent, uncontrolled, without long term complications, but with hyperglycemia  2.  Overweight  3. HL  PLAN:  1. Patient with long-standing, uncontrolled, type 2 diabetes, on oral antidiabetic regimen with metformin, Invokana, Januvia, and glipizide.  At that time sugars were better after he eliminated meat (diagnosed with alpha gal mutation) and started to incorporate more fruits and vegetables and limiting his portions).  He was also increasing his exercise.  However, his sugars were still high in the morning and before meals, so we added glipizide before breakfast and dinner.  He now returns after an 45-month absence.  - He is interested in simplifying his regimen especially coming off Invokana and Januvia - I suggested to:  Patient Instructions  Please continue: - Metformin ER 1000 mg 2x daily with meals - Glipizide 5 mg before breakfast and dinner - Invokana 300 mg daily in am - Januvia  100 mg daily in am  Please return in 3-4 months with your sugar log.    - today, HbA1c is 7%  - continue checking sugars at different times of the day - check 1x a day, rotating checks - advised for yearly eye exams >> he is UTD - Return to clinic in 3-4 mo with sugar log    2.  Overweight -Continue Invokana which should also help with weight loss - Also continue diet changes and exercise  3. HL - Reviewed latest lipid panel from 02/2017: LDL improved, now under 100. Lab Results  Component Value Date   CHOL 166 03/14/2017   HDL 41.70 03/14/2017   LDLCALC 96 03/14/2017   LDLDIRECT 142.0 11/13/2014   TRIG 142.0 03/14/2017   CHOLHDL 4 03/14/2017  - Continues atorvastatin without side effects.  Philemon Kingdom, MD PhD [facility]

## 2018-01-03 MED FILL — MELOXICAM 15 MG TABLET: 15 | 30 days supply | Qty: 30 | Fill #0

## 2018-01-04 ENCOUNTER — Ambulatory Visit (INDEPENDENT_AMBULATORY_CARE_PROVIDER_SITE_OTHER): Payer: 59

## 2018-01-04 ENCOUNTER — Ambulatory Visit: Payer: 59 | Admitting: Family

## 2018-01-04 DIAGNOSIS — Z23 Encounter for immunization: Secondary | ICD-10-CM

## 2018-01-09 NOTE — Pre-Procedure Instructions (Signed)
NARESH ALTHAUS  01/09/2018      [place], [address] [place] [address] Phone: [phone] Fax: [phone]    Your procedure is scheduled on Thursday December 5th.  Report to [facility] at [email or web address].  Call this number if you have problems the morning of surgery:  [phone]   Remember:  Do not eat or drink after midnight.    Take these medicines the morning of surgery with A SIP OF WATER   traMADol (ULTRAM) if needed   7 days prior to surgery STOP taking any diclofenac sodium (VOLTAREN), meloxicam (MOBIC), Aspirin(unless otherwise instructed by your surgeon), Aleve, Naproxen, Ibuprofen, Motrin, Advil, Goody's, BC's, all herbal medications, fish oil, and all vitamins   WHAT DO I DO ABOUT MY DIABETES MEDICATION?   Marland Kitchen Do not take oral diabetes medicines (pills) the morning of surgery: JANUVIA, metFORMIN (GLUCOPHAGE-XR), and INVOKANA .  Marland Kitchen The day of surgery, do not take other diabetes injectables, including Byetta (exenatide), Bydureon (exenatide ER), Victoza (liraglutide), or Trulicity (dulaglutide).  . If your CBG is greater than 220 mg/dL, you may take  of your sliding scale (correction) dose of insulin.   How to Manage Your Diabetes Before and After Surgery  Why is it important to control my blood sugar before and after surgery? . Improving blood sugar levels before and after surgery helps healing and can limit problems. . A way of improving blood sugar control is eating a healthy diet by: o  Eating less sugar and carbohydrates o  Increasing activity/exercise o  Talking with your doctor about reaching your blood sugar goals . High blood sugars (greater than 180 mg/dL) can raise your risk of infections and slow your recovery, so you will need to focus on controlling your diabetes during the weeks before surgery. . Make sure that the doctor who takes  care of your diabetes knows about your planned surgery including the date and location.  How do I manage my blood sugar before surgery? . Check your blood sugar at least 4 times a day, starting 2 days before surgery, to make sure that the level is not too high or low. o Check your blood sugar the morning of your surgery when you wake up and every 2 hours until you get to the Short Stay unit. . If your blood sugar is less than 70 mg/dL, you will need to treat for low blood sugar: o Do not take insulin. o Treat a low blood sugar (less than 70 mg/dL) with  cup of clear juice (cranberry or apple), 4 glucose tablets, OR glucose gel. o Recheck blood sugar in 15 minutes after treatment (to make sure it is greater than 70 mg/dL). If your blood sugar is not greater than 70 mg/dL on recheck, call [phone] for further instructions. . Report your blood sugar to the short stay nurse when you get to Short Stay.  . If you are admitted to the hospital after surgery: o Your blood sugar will be checked by the staff and you will probably be given insulin after surgery (instead of oral diabetes medicines) to make sure you have good blood sugar levels. o The goal for blood sugar control after surgery is 80-180 mg/dL.   Do not wear jewelry.  Do not wear lotions, powders, or colognes, or deodorant.  Men may shave face and neck.  Do not bring valuables  to the hospital.  [facility] is not responsible for any belongings or valuables.  Contacts, dentures or bridgework may not be worn into surgery.  Leave your suitcase in the car.  After surgery it may be brought to your room.  For patients admitted to the hospital, discharge time will be determined by your treatment team.  Patients discharged the day of surgery will not be allowed to drive home.    [place]- Preparing For Surgery  Before surgery, you can play an important role. Because skin is not sterile, your skin needs to be as free of germs as  possible. You can reduce the number of germs on your skin by washing with CHG (chlorahexidine gluconate) Soap before surgery.  CHG is an antiseptic cleaner which kills germs and bonds with the skin to continue killing germs even after washing.    Oral Hygiene is also important to reduce your risk of infection.  Remember - BRUSH YOUR TEETH THE MORNING OF SURGERY WITH YOUR REGULAR TOOTHPASTE  Please do not use if you have an allergy to CHG or antibacterial soaps. If your skin becomes reddened/irritated stop using the CHG.  Do not shave (including legs and underarms) for at least 48 hours prior to first CHG shower. It is OK to shave your face.  Please follow these instructions carefully.   1. Shower the NIGHT BEFORE SURGERY and the MORNING OF SURGERY with CHG.   2. If you chose to wash your hair, wash your hair first as usual with your normal shampoo.  3. After you shampoo, rinse your hair and body thoroughly to remove the shampoo.  4. Use CHG as you would any other liquid soap. You can apply CHG directly to the skin and wash gently with a scrungie or a clean washcloth.   5. Apply the CHG Soap to your body ONLY FROM THE NECK DOWN.  Do not use on open wounds or open sores. Avoid contact with your eyes, ears, mouth and genitals (private parts). Wash Face and genitals (private parts)  with your normal soap.  6. Wash thoroughly, paying special attention to the area where your surgery will be performed.  7. Thoroughly rinse your body with warm water from the neck down.  8. DO NOT shower/wash with your normal soap after using and rinsing off the CHG Soap.  9. Pat yourself dry with a CLEAN TOWEL.  10. Wear CLEAN PAJAMAS to bed the night before surgery, wear comfortable clothes the morning of surgery  11. Place CLEAN SHEETS on your bed the night of your first shower and DO NOT SLEEP WITH PETS.    Day of Surgery:  Do not apply any deodorants/lotions.  Please wear clean clothes to the  hospital/surgery center.   Remember to brush your teeth WITH YOUR REGULAR TOOTHPASTE.    Please read over the following fact sheets that you were given.

## 2018-01-10 ENCOUNTER — Encounter (HOSPITAL_COMMUNITY)
Admission: RE | Admit: 2018-01-10 | Discharge: 2018-01-10 | Disposition: A | Discharge status: Home / Self Care | Payer: 59 | Source: Ambulatory Visit | Attending: Orthopedic Surgery | Admitting: Orthopedic Surgery

## 2018-01-10 ENCOUNTER — Encounter (HOSPITAL_COMMUNITY): Payer: Self-pay

## 2018-01-10 ENCOUNTER — Other Ambulatory Visit: Payer: Self-pay

## 2018-01-10 DIAGNOSIS — Z01812 Encounter for preprocedural laboratory examination: Secondary | ICD-10-CM | POA: Diagnosis not present

## 2018-01-10 LAB — BASIC METABOLIC PANEL
ANION GAP: 10 (ref 5–15)
BUN: 11 mg/dL (ref 8–23)
CALCIUM: 9.5 mg/dL (ref 8.9–10.3)
CO2: 22 mmol/L (ref 22–32)
Chloride: 107 mmol/L (ref 98–111)
Creatinine, Ser: 1.16 mg/dL (ref 0.61–1.24)
Glucose, Bld: 162 mg/dL — ABNORMAL HIGH (ref 70–99)
Potassium: 4 mmol/L (ref 3.5–5.1)
Sodium: 139 mmol/L (ref 135–145)

## 2018-01-10 LAB — CBC
HCT: 46.9 % (ref 39.0–52.0)
Hemoglobin: 15.6 g/dL (ref 13.0–17.0)
MCH: 29.6 pg (ref 26.0–34.0)
MCHC: 33.3 g/dL (ref 30.0–36.0)
MCV: 89 fL (ref 80.0–100.0)
NRBC: 0 % (ref 0.0–0.2)
PLATELETS: 193 10*3/uL (ref 150–400)
RBC: 5.27 MIL/uL (ref 4.22–5.81)
RDW: 11.9 % (ref 11.5–15.5)
WBC: 9.2 10*3/uL (ref 4.0–10.5)

## 2018-01-10 LAB — SURGICAL PCR SCREEN
MRSA, PCR: NEGATIVE
STAPHYLOCOCCUS AUREUS: NEGATIVE

## 2018-01-10 NOTE — Progress Notes (Signed)
PCP - Debbrah Alar NP  Chest x-ray - N/A EKG - 02/23/17   Fasting Blood Sugar - 120-140 Checks Blood Sugar 1 times a day  Blood Thinner Instructions: N/A Aspirin Instructions: stop 7 days prior  Anesthesia review: yes, EKG review  Patient denies shortness of breath, fever, cough and chest pain at PAT appointment   Patient verbalized understanding of instructions that were given to them at the PAT appointment. Patient was also instructed that they will need to review over the PAT instructions again at home before surgery.

## 2018-01-11 MED FILL — traMADol HCL 50 MG TABS: 50 | 10 days supply | Qty: 20 | Fill #0

## 2018-01-18 MED ORDER — TRANEXAMIC ACID-NACL 1000-0.7 MG/100ML-% IV SOLN
1000.0000 mg | INTRAVENOUS | Status: AC
  Administered 2018-01-19: 1000 mg via INTRAVENOUS
  Filled 2018-01-18: qty 100

## 2018-01-18 NOTE — Anesthesia Preprocedure Evaluation (Addendum)
Anesthesia Evaluation  Patient identified by MRN, date of birth, ID band Patient awake    Reviewed: Allergy & Precautions, NPO status , Patient's Chart, lab work & pertinent test results  History of Anesthesia Complications (+) PONV  Airway Mallampati: II  TM Distance: >3 FB Neck ROM: Full    Dental no notable dental hx. (+) Teeth Intact, Dental Advisory Given   Pulmonary neg pulmonary ROS, former smoker,    Pulmonary exam normal breath sounds clear to auscultation       Cardiovascular negative cardio ROS Normal cardiovascular exam Rhythm:Regular Rate:Normal     Neuro/Psych negative psych ROS   GI/Hepatic Neg liver ROS,   Endo/Other  diabetes, Well Controlled, Type 2  Renal/GU      Musculoskeletal   Abdominal   Peds  Hematology negative hematology ROS (+)   Anesthesia Other Findings   Reproductive/Obstetrics negative OB ROS                           Lab Results  Component Value Date   CREATININE 1.16 01/10/2018   BUN 11 01/10/2018   NA 139 01/10/2018   K 4.0 01/10/2018   CL 107 01/10/2018   CO2 22 01/10/2018    Lab Results  Component Value Date   WBC 9.2 01/10/2018   HGB 15.6 01/10/2018   HCT 46.9 01/10/2018   MCV 89.0 01/10/2018   PLT 193 01/10/2018    Anesthesia Physical Anesthesia Plan  ASA: II  Anesthesia Plan: General   Post-op Pain Management:  Regional for Post-op pain   Induction: Intravenous  PONV Risk Score and Plan: Scopolamine patch - Pre-op, Ondansetron, Dexamethasone and Treatment may vary due to age or medical condition  Airway Management Planned: Oral ETT  Additional Equipment:   Intra-op Plan:   Post-operative Plan: Extubation in OR  Informed Consent: I have reviewed the patients History and Physical, chart, labs and discussed the procedure including the risks, benefits and alternatives for the proposed anesthesia with the patient or  authorized representative who has indicated his/her understanding and acceptance.   Dental advisory given  Plan Discussed with: CRNA  Anesthesia Plan Comments: (L RTC repair w liposomal bupivicaine )        Anesthesia Quick Evaluation

## 2018-01-19 ENCOUNTER — Other Ambulatory Visit: Payer: Self-pay

## 2018-01-19 ENCOUNTER — Ambulatory Visit (HOSPITAL_COMMUNITY): Payer: 59 | Admitting: Anesthesiology

## 2018-01-19 ENCOUNTER — Encounter (HOSPITAL_COMMUNITY): Payer: Self-pay | Admitting: *Deleted

## 2018-01-19 ENCOUNTER — Encounter (HOSPITAL_COMMUNITY)
Admission: AD | Disposition: A | Discharge status: Home / Self Care | Payer: Self-pay | Source: Ambulatory Visit | Attending: Orthopedic Surgery

## 2018-01-19 ENCOUNTER — Observation Stay (HOSPITAL_COMMUNITY)
Admission: AD | Admit: 2018-01-19 | Discharge: 2018-01-20 | Disposition: A | Discharge status: Home / Self Care | Payer: 59 | Source: Ambulatory Visit | Attending: Orthopedic Surgery | Admitting: Orthopedic Surgery

## 2018-01-19 ENCOUNTER — Ambulatory Visit (HOSPITAL_COMMUNITY): Payer: 59 | Admitting: Physician Assistant

## 2018-01-19 DIAGNOSIS — I890 Lymphedema, not elsewhere classified: Secondary | ICD-10-CM | POA: Diagnosis not present

## 2018-01-19 DIAGNOSIS — G8918 Other acute postprocedural pain: Secondary | ICD-10-CM | POA: Diagnosis not present

## 2018-01-19 DIAGNOSIS — E119 Type 2 diabetes mellitus without complications: Secondary | ICD-10-CM | POA: Diagnosis not present

## 2018-01-19 DIAGNOSIS — M19112 Post-traumatic osteoarthritis, left shoulder: Secondary | ICD-10-CM | POA: Diagnosis not present

## 2018-01-19 DIAGNOSIS — J3089 Other allergic rhinitis: Secondary | ICD-10-CM | POA: Diagnosis not present

## 2018-01-19 DIAGNOSIS — Z87891 Personal history of nicotine dependence: Secondary | ICD-10-CM | POA: Insufficient documentation

## 2018-01-19 DIAGNOSIS — Z791 Long term (current) use of non-steroidal anti-inflammatories (NSAID): Secondary | ICD-10-CM | POA: Diagnosis not present

## 2018-01-19 DIAGNOSIS — Z7984 Long term (current) use of oral hypoglycemic drugs: Secondary | ICD-10-CM | POA: Diagnosis not present

## 2018-01-19 DIAGNOSIS — M75102 Unspecified rotator cuff tear or rupture of left shoulder, not specified as traumatic: Secondary | ICD-10-CM | POA: Diagnosis not present

## 2018-01-19 DIAGNOSIS — Z471 Aftercare following joint replacement surgery: Secondary | ICD-10-CM | POA: Diagnosis not present

## 2018-01-19 DIAGNOSIS — Z7982 Long term (current) use of aspirin: Secondary | ICD-10-CM | POA: Insufficient documentation

## 2018-01-19 DIAGNOSIS — Z79899 Other long term (current) drug therapy: Secondary | ICD-10-CM | POA: Insufficient documentation

## 2018-01-19 DIAGNOSIS — M25512 Pain in left shoulder: Secondary | ICD-10-CM | POA: Diagnosis not present

## 2018-01-19 DIAGNOSIS — M12812 Other specific arthropathies, not elsewhere classified, left shoulder: Secondary | ICD-10-CM | POA: Diagnosis not present

## 2018-01-19 DIAGNOSIS — Z96612 Presence of left artificial shoulder joint: Secondary | ICD-10-CM | POA: Diagnosis not present

## 2018-01-19 HISTORY — PX: REVERSE SHOULDER ARTHROPLASTY: SHX5054

## 2018-01-19 LAB — GLUCOSE, CAPILLARY
GLUCOSE-CAPILLARY: 133 mg/dL — AB (ref 70–99)
GLUCOSE-CAPILLARY: 158 mg/dL — AB (ref 70–99)
Glucose-Capillary: 177 mg/dL — ABNORMAL HIGH (ref 70–99)
Glucose-Capillary: 210 mg/dL — ABNORMAL HIGH (ref 70–99)

## 2018-01-19 SURGERY — ARTHROPLASTY, SHOULDER, TOTAL, REVERSE
Anesthesia: General | Site: Shoulder | Laterality: Left

## 2018-01-19 MED ORDER — BISACODYL 5 MG PO TBEC: 5.0000 mg | DELAYED_RELEASE_TABLET | Freq: Every day | ORAL | Status: DC | PRN

## 2018-01-19 MED ORDER — HYDROMORPHONE HCL 1 MG/ML IJ SOLN
0.5000 mg | INTRAMUSCULAR | Status: DC | PRN
  Administered 2018-01-19: 1 mg via INTRAVENOUS
  Filled 2018-01-19: qty 1

## 2018-01-19 MED ORDER — ONDANSETRON HCL 4 MG/2ML IJ SOLN: 4.0000 mg | Freq: Once | INTRAMUSCULAR | Status: DC | PRN

## 2018-01-19 MED ORDER — HYDROCODONE-ACETAMINOPHEN 7.5-325 MG PO TABS
1.0000 | ORAL_TABLET | Freq: Once | ORAL | Status: AC | PRN
  Administered 2018-01-19: 1 via ORAL

## 2018-01-19 MED ORDER — METOCLOPRAMIDE HCL 5 MG PO TABS: 5.0000 mg | ORAL_TABLET | Freq: Three times a day (TID) | ORAL | Status: DC | PRN

## 2018-01-19 MED ORDER — MAGNESIUM CITRATE PO SOLN: 1.0000 | Freq: Once | ORAL | Status: DC | PRN

## 2018-01-19 MED ORDER — ROCURONIUM BROMIDE 50 MG/5ML IV SOSY
PREFILLED_SYRINGE | INTRAVENOUS | Status: DC | PRN
  Administered 2018-01-19: 50 mg via INTRAVENOUS

## 2018-01-19 MED ORDER — LACTATED RINGERS IV SOLN
INTRAVENOUS | Status: DC | PRN
  Administered 2018-01-19 (×2): via INTRAVENOUS

## 2018-01-19 MED ORDER — BUPIVACAINE HCL (PF) 0.5 % IJ SOLN
INTRAMUSCULAR | Status: DC | PRN
  Administered 2018-01-19: 4 mL

## 2018-01-19 MED ORDER — SENNA 8.6 MG PO TABS
1.0000 | ORAL_TABLET | Freq: Two times a day (BID) | ORAL | Status: DC
  Administered 2018-01-20: 8.6 mg via ORAL
  Filled 2018-01-19: qty 1

## 2018-01-19 MED ORDER — ONDANSETRON HCL 4 MG/2ML IJ SOLN
INTRAMUSCULAR | Status: AC
  Filled 2018-01-19: qty 2

## 2018-01-19 MED ORDER — METOCLOPRAMIDE HCL 5 MG/ML IJ SOLN: 5.0000 mg | Freq: Three times a day (TID) | INTRAMUSCULAR | Status: DC | PRN

## 2018-01-19 MED ORDER — PHENYLEPHRINE 40 MCG/ML (10ML) SYRINGE FOR IV PUSH (FOR BLOOD PRESSURE SUPPORT)
PREFILLED_SYRINGE | INTRAVENOUS | Status: AC
  Filled 2018-01-19: qty 10

## 2018-01-19 MED ORDER — PHENOL 1.4 % MT LIQD: 1.0000 | OROMUCOSAL | Status: DC | PRN

## 2018-01-19 MED ORDER — OXYCODONE HCL 5 MG PO TABS
10.0000 mg | ORAL_TABLET | ORAL | Status: DC | PRN
  Administered 2018-01-19 – 2018-01-20 (×2): 10 mg via ORAL
  Filled 2018-01-19: qty 3
  Filled 2018-01-19: qty 2

## 2018-01-19 MED ORDER — FENTANYL CITRATE (PF) 250 MCG/5ML IJ SOLN
INTRAMUSCULAR | Status: AC
  Filled 2018-01-19: qty 5

## 2018-01-19 MED ORDER — INSULIN ASPART 100 UNIT/ML ~~LOC~~ SOLN: 0.0000 [IU] | Freq: Three times a day (TID) | SUBCUTANEOUS | Status: DC

## 2018-01-19 MED ORDER — 0.9 % SODIUM CHLORIDE (POUR BTL) OPTIME
TOPICAL | Status: DC | PRN
  Administered 2018-01-19: 1000 mL

## 2018-01-19 MED ORDER — PROPOFOL 10 MG/ML IV BOLUS
INTRAVENOUS | Status: AC
  Filled 2018-01-19: qty 20

## 2018-01-19 MED ORDER — CEFAZOLIN SODIUM-DEXTROSE 2-4 GM/100ML-% IV SOLN
INTRAVENOUS | Status: AC
  Filled 2018-01-19: qty 100

## 2018-01-19 MED ORDER — KETOROLAC TROMETHAMINE 30 MG/ML IJ SOLN
INTRAMUSCULAR | Status: AC
  Administered 2018-01-19: 30 mg
  Filled 2018-01-19: qty 1

## 2018-01-19 MED ORDER — SUGAMMADEX SODIUM 200 MG/2ML IV SOLN
INTRAVENOUS | Status: DC | PRN
  Administered 2018-01-19: 200 mg via INTRAVENOUS

## 2018-01-19 MED ORDER — FENTANYL CITRATE (PF) 100 MCG/2ML IJ SOLN
INTRAMUSCULAR | Status: DC | PRN
  Administered 2018-01-19: 100 ug via INTRAVENOUS
  Administered 2018-01-19: 50 ug via INTRAVENOUS

## 2018-01-19 MED ORDER — OXYCODONE HCL 5 MG PO TABS: 5.0000 mg | ORAL_TABLET | ORAL | Status: DC | PRN

## 2018-01-19 MED ORDER — POLYETHYLENE GLYCOL 3350 17 G PO PACK: 17.0000 g | PACK | Freq: Every day | ORAL | Status: DC | PRN

## 2018-01-19 MED ORDER — KETOROLAC TROMETHAMINE 30 MG/ML IJ SOLN
30.0000 mg | Freq: Once | INTRAMUSCULAR | Status: AC | PRN
  Administered 2018-01-19: 30 mg via INTRAVENOUS

## 2018-01-19 MED ORDER — SCOPOLAMINE 1 MG/3DAYS TD PT72
1.0000 | MEDICATED_PATCH | TRANSDERMAL | Status: DC
  Administered 2018-01-19: 1.5 mg via TRANSDERMAL
  Filled 2018-01-19: qty 1

## 2018-01-19 MED ORDER — BUPIVACAINE LIPOSOME 1.3 % IJ SUSP
INTRAMUSCULAR | Status: DC | PRN
  Administered 2018-01-19: 10 mL

## 2018-01-19 MED ORDER — MIDAZOLAM HCL 2 MG/2ML IJ SOLN
INTRAMUSCULAR | Status: AC
  Filled 2018-01-19: qty 2

## 2018-01-19 MED ORDER — CHLORHEXIDINE GLUCONATE 4 % EX LIQD
60.0000 mL | Freq: Once | CUTANEOUS | Status: DC
  Administered 2018-01-19: 4 via TOPICAL

## 2018-01-19 MED ORDER — DEXAMETHASONE SODIUM PHOSPHATE 10 MG/ML IJ SOLN
INTRAMUSCULAR | Status: AC
  Filled 2018-01-19: qty 1

## 2018-01-19 MED ORDER — PROPOFOL 10 MG/ML IV BOLUS
INTRAVENOUS | Status: DC | PRN
  Administered 2018-01-19: 150 mg via INTRAVENOUS

## 2018-01-19 MED ORDER — ONDANSETRON HCL 4 MG/2ML IJ SOLN: 4.0000 mg | Freq: Four times a day (QID) | INTRAMUSCULAR | Status: DC | PRN

## 2018-01-19 MED ORDER — MIDAZOLAM HCL 5 MG/5ML IJ SOLN
INTRAMUSCULAR | Status: DC | PRN
  Administered 2018-01-19: 2 mg via INTRAVENOUS

## 2018-01-19 MED ORDER — HYDROMORPHONE HCL 1 MG/ML IJ SOLN: 0.2500 mg | INTRAMUSCULAR | Status: DC | PRN

## 2018-01-19 MED ORDER — DIPHENHYDRAMINE HCL 12.5 MG/5ML PO ELIX: 12.5000 mg | ORAL_SOLUTION | ORAL | Status: DC | PRN

## 2018-01-19 MED ORDER — HYDROCODONE-ACETAMINOPHEN 7.5-325 MG PO TABS
ORAL_TABLET | ORAL | Status: AC
  Administered 2018-01-19: 1
  Filled 2018-01-19: qty 1

## 2018-01-19 MED ORDER — ACETAMINOPHEN 325 MG PO TABS: 325.0000 mg | ORAL_TABLET | Freq: Four times a day (QID) | ORAL | Status: DC | PRN

## 2018-01-19 MED ORDER — LACTATED RINGERS IV SOLN
INTRAVENOUS | Status: DC
  Administered 2018-01-19: 13:00:00 via INTRAVENOUS

## 2018-01-19 MED ORDER — KETOROLAC TROMETHAMINE 15 MG/ML IJ SOLN
15.0000 mg | Freq: Four times a day (QID) | INTRAMUSCULAR | Status: AC
  Administered 2018-01-19 – 2018-01-20 (×4): 15 mg via INTRAVENOUS
  Filled 2018-01-19 (×4): qty 1

## 2018-01-19 MED ORDER — MEPERIDINE HCL 50 MG/ML IJ SOLN: 6.2500 mg | INTRAMUSCULAR | Status: DC | PRN

## 2018-01-19 MED ORDER — TRAMADOL HCL 50 MG PO TABS: 50.0000 mg | ORAL_TABLET | Freq: Four times a day (QID) | ORAL | Status: DC | PRN

## 2018-01-19 MED ORDER — ONDANSETRON HCL 4 MG PO TABS: 4.0000 mg | ORAL_TABLET | Freq: Four times a day (QID) | ORAL | Status: DC | PRN

## 2018-01-19 MED ORDER — METHOCARBAMOL 500 MG PO TABS
500.0000 mg | ORAL_TABLET | Freq: Four times a day (QID) | ORAL | Status: DC | PRN
  Administered 2018-01-19: 500 mg via ORAL
  Filled 2018-01-19: qty 1

## 2018-01-19 MED ORDER — TRAZODONE HCL 50 MG PO TABS: 25.0000 mg | ORAL_TABLET | Freq: Every evening | ORAL | Status: DC | PRN

## 2018-01-19 MED ORDER — ASPIRIN EC 325 MG PO TBEC
325.0000 mg | DELAYED_RELEASE_TABLET | Freq: Every day | ORAL | Status: DC
  Administered 2018-01-19 – 2018-01-20 (×2): 325 mg via ORAL
  Filled 2018-01-19: qty 1

## 2018-01-19 MED ORDER — MENTHOL 3 MG MT LOZG: 1.0000 | LOZENGE | OROMUCOSAL | Status: DC | PRN

## 2018-01-19 MED ORDER — METHOCARBAMOL 1000 MG/10ML IJ SOLN
500.0000 mg | Freq: Four times a day (QID) | INTRAVENOUS | Status: DC | PRN
  Filled 2018-01-19 (×2): qty 5

## 2018-01-19 MED ORDER — ONDANSETRON HCL 4 MG/2ML IJ SOLN
INTRAMUSCULAR | Status: DC | PRN
  Administered 2018-01-19: 4 mg via INTRAVENOUS

## 2018-01-19 MED ORDER — ALUM & MAG HYDROXIDE-SIMETH 200-200-20 MG/5ML PO SUSP: 30.0000 mL | ORAL | Status: DC | PRN

## 2018-01-19 MED ORDER — DEXAMETHASONE SODIUM PHOSPHATE 10 MG/ML IJ SOLN
INTRAMUSCULAR | Status: DC | PRN
  Administered 2018-01-19: 10 mg via INTRAVENOUS

## 2018-01-19 MED ORDER — CEFAZOLIN SODIUM-DEXTROSE 2-4 GM/100ML-% IV SOLN
2.0000 g | INTRAVENOUS | Status: AC
  Administered 2018-01-19: 2 g via INTRAVENOUS

## 2018-01-19 MED ORDER — SODIUM CHLORIDE 0.9 % IV SOLN
INTRAVENOUS | Status: DC | PRN
  Administered 2018-01-19: 50 ug/min via INTRAVENOUS

## 2018-01-19 MED ORDER — LIDOCAINE 2% (20 MG/ML) 5 ML SYRINGE
INTRAMUSCULAR | Status: DC | PRN
  Administered 2018-01-19: 100 mg via INTRAVENOUS

## 2018-01-19 SURGICAL SUPPLY — 63 items
BLADE SAW SGTL 83.5X18.5 (BLADE) ×2 IMPLANT
COVER SURGICAL LIGHT HANDLE (MISCELLANEOUS) ×2 IMPLANT
COVER TRANSDUCER CIVFLEX 5.5 (CAP) ×2 IMPLANT
COVER WAND RF STERILE (DRAPES) ×2 IMPLANT
CUP SUT UNIV REVERS 39 NEU (Shoulder) ×2 IMPLANT
DERMABOND ADHESIVE PROPEN (GAUZE/BANDAGES/DRESSINGS) ×1
DERMABOND ADVANCED (GAUZE/BANDAGES/DRESSINGS) ×1
DERMABOND ADVANCED .7 DNX12 (GAUZE/BANDAGES/DRESSINGS) ×1 IMPLANT
DERMABOND ADVANCED .7 DNX6 (GAUZE/BANDAGES/DRESSINGS) ×1 IMPLANT
DRAPE ORTHO SPLIT 77X108 STRL (DRAPES) ×2
DRAPE SURG 17X11 SM STRL (DRAPES) ×2 IMPLANT
DRAPE SURG ORHT 6 SPLT 77X108 (DRAPES) ×2 IMPLANT
DRAPE U-SHAPE 47X51 STRL (DRAPES) ×2 IMPLANT
DRESSING AQUACEL AG SP 3.5X10 (GAUZE/BANDAGES/DRESSINGS) ×1 IMPLANT
DRSG AQUACEL AG ADV 3.5X10 (GAUZE/BANDAGES/DRESSINGS) ×2 IMPLANT
DRSG AQUACEL AG SP 3.5X10 (GAUZE/BANDAGES/DRESSINGS) ×2
DURAPREP 26ML APPLICATOR (WOUND CARE) ×2 IMPLANT
ELECT BLADE 4.0 EZ CLEAN MEGAD (MISCELLANEOUS) ×2
ELECT CAUTERY BLADE 6.4 (BLADE) ×2 IMPLANT
ELECT REM PT RETURN 9FT ADLT (ELECTROSURGICAL) ×2
ELECTRODE BLDE 4.0 EZ CLN MEGD (MISCELLANEOUS) ×1 IMPLANT
ELECTRODE REM PT RTRN 9FT ADLT (ELECTROSURGICAL) ×1 IMPLANT
FACESHIELD WRAPAROUND (MASK) ×6 IMPLANT
GLENOID UNI REV MOD 24 +2 LAT (Joint) ×2 IMPLANT
GLENOSPHERE 39+4 LAT/24 UNI RV (Joint) ×2 IMPLANT
GLOVE BIO SURGEON STRL SZ7.5 (GLOVE) ×2 IMPLANT
GLOVE BIO SURGEON STRL SZ8 (GLOVE) ×2 IMPLANT
GLOVE BIOGEL PI IND STRL 8 (GLOVE) ×2 IMPLANT
GLOVE BIOGEL PI INDICATOR 8 (GLOVE) ×2
GLOVE EUDERMIC 7 POWDERFREE (GLOVE) ×2 IMPLANT
GLOVE SS BIOGEL STRL SZ 7.5 (GLOVE) ×1 IMPLANT
GLOVE SUPERSENSE BIOGEL SZ 7.5 (GLOVE) ×1
GOWN STRL REUS W/ TWL LRG LVL3 (GOWN DISPOSABLE) ×1 IMPLANT
GOWN STRL REUS W/ TWL XL LVL3 (GOWN DISPOSABLE) ×2 IMPLANT
GOWN STRL REUS W/TWL LRG LVL3 (GOWN DISPOSABLE) ×1
GOWN STRL REUS W/TWL XL LVL3 (GOWN DISPOSABLE) ×2
INSERT HUMERAL 39/+6 (Insert) ×2 IMPLANT
KIT BASIN OR (CUSTOM PROCEDURE TRAY) ×2 IMPLANT
KIT TURNOVER KIT B (KITS) ×2 IMPLANT
MANIFOLD NEPTUNE II (INSTRUMENTS) ×2 IMPLANT
NEEDLE TAPERED W/ NITINOL LOOP (MISCELLANEOUS) ×2 IMPLANT
NS IRRIG 1000ML POUR BTL (IV SOLUTION) ×2 IMPLANT
PACK SHOULDER (CUSTOM PROCEDURE TRAY) ×2 IMPLANT
PAD ARMBOARD 7.5X6 YLW CONV (MISCELLANEOUS) ×4 IMPLANT
PIN SET MODULAR GLENOID SYSTEM (PIN) ×2 IMPLANT
RESTRAINT HEAD UNIVERSAL NS (MISCELLANEOUS) ×2 IMPLANT
SCREW CENTRAL MOD 30MM (Screw) ×2 IMPLANT
SCREW PERI LOCK 5.5X16 (Screw) ×2 IMPLANT
SCREW PERI LOCK 5.5X24 (Screw) ×2 IMPLANT
SCREW PERIPHERAL 5.5X20 LOCK (Screw) ×2 IMPLANT
SCREW PERIPHERAL 5.5X28 LOCK (Screw) ×2 IMPLANT
SLING ARM FOAM STRAP LRG (SOFTGOODS) ×2 IMPLANT
SPONGE LAP 18X18 X RAY DECT (DISPOSABLE) ×2 IMPLANT
SPONGE LAP 4X18 RFD (DISPOSABLE) ×2 IMPLANT
STEM HUMERAL UNIVERS SZ8 (Stem) ×2 IMPLANT
SUT MNCRL AB 3-0 PS2 18 (SUTURE) ×2 IMPLANT
SUT MON AB 2-0 CT1 27 (SUTURE) ×2 IMPLANT
SUT VIC AB 1 CT1 27 (SUTURE) ×1
SUT VIC AB 1 CT1 27XBRD ANBCTR (SUTURE) ×1 IMPLANT
SUTURE TAPE 1.3 40 TPR END (SUTURE) ×1 IMPLANT
SUTURETAPE 1.3 40 TPR END (SUTURE) ×2
TOWEL OR 17X26 10 PK STRL BLUE (TOWEL DISPOSABLE) ×2 IMPLANT
WATER STERILE IRR 1000ML POUR (IV SOLUTION) ×2 IMPLANT

## 2018-01-19 NOTE — H&P (Signed)
Larry Davis    Chief Complaint: left shoulder rotator cuff tear arthropathy HPI: The patient is a 62 y.o. male with end stage left shoulder rotator cuff tear arthropathy  Past Medical History:  Diagnosis Date  . Allergy   . 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 Diabetes mellitus without complication (Larry Davis)   . PONV (postoperative nausea and vomiting)   . Prostate cancer ([place]) 03/2014  . Urticaria     Past Surgical History:  Procedure Laterality Date  . ADENOIDECTOMY    . CHOLECYSTECTOMY  2000  . COLONOSCOPY    . LYMPHADENECTOMY Bilateral 06/14/2014   Procedure: PELVIC LYMPH NODE DISSECTION;  Surgeon: Larry Frock, MD;  Location: [facility];  Service: Urology;  Laterality: Bilateral;  . ROBOT ASSISTED LAPAROSCOPIC RADICAL PROSTATECTOMY N/A 06/14/2014   Procedure: ROBOTIC ASSISTED LAPAROSCOPIC RADICAL PROSTATECTOMY WITH INDOCYANINE GREEN DYE INJECTION;  Surgeon: Larry Frock, MD;  Location: [facility];  Service: Urology;  Laterality: N/A;  . SHOULDER SURGERY Right 1990   arthroscopy  . TONSILLECTOMY      Family History  Problem Relation Age of Onset  . Heart disease Mother   . Diabetes Mother   . Arthritis Mother   . Hypertension Mother   . Heart disease Father   . Hypertension Father   . Diabetes Father   . Arthritis Father   . Cancer Maternal Uncle        prostate  . Cancer Maternal Grandmother        cervical    Social History:  reports that he quit smoking about 39 years ago. His smoking use included cigarettes. He has a 5.00 pack-year smoking history. He has never used smokeless tobacco. He reports that he does not drink alcohol or use drugs.   Medications Prior to Admission  Medication Sig Dispense Refill  . diclofenac sodium (VOLTAREN) 1 % GEL Apply 1 application topically daily.    Marland Kitchen EPINEPHrine 0.3 mg/0.3 mL IJ SOAJ injection Inject 0.3 mLs (0.3 mg total) into the muscle once for 1 dose. 2 Device 1  . INVOKANA 300 MG TABS tablet TAKE 1 TABLET BY MOUTH  DAILY BEFORE BREAKFAST. (Patient taking differently: Take 300 mg by mouth daily before breakfast. ) 30 tablet 2  . JANUVIA 100 MG tablet TAKE 1 TABLET BY MOUTH DAILY 90 tablet 0  . meloxicam (MOBIC) 7.5 MG tablet Take 1 tablet (7.5 mg total) by mouth 2 (two) times daily. 60 tablet 0  . metFORMIN (GLUCOPHAGE-XR) 500 MG 24 hr tablet Take 2 tablets (1,000 mg total) 2 (two) times daily with a meal by mouth. 360 tablet 3  . traMADol (ULTRAM) 50 MG tablet Take 1 tablet (50 mg total) by mouth every 8 (eight) hours as needed. (Patient taking differently: Take 50 mg by mouth every 8 (eight) hours as needed (pain). ) 15 tablet 0  . aspirin EC 325 MG tablet Take 1 tablet (325 mg total) by mouth daily. 90 tablet 3  . Blood Glucose Monitoring Suppl (FREESTYLE LITE) DEVI Use as directed 1 each 0  . glucose blood (FREESTYLE LITE) test strip Use to check blood sugars up to 3 times daily 300 each 6  . Lancets (FREESTYLE) lancets Use to check blood sugars up to 3 times daily 100 each 12  . traZODone (DESYREL) 50 MG tablet Take 0.5-1 tablets (25-50 mg total) by mouth at bedtime as needed for sleep. 30 tablet 3     Physical Exam: Left shoulder with painful and restricted motion as noted at recent  office visits  Vitals  Temp:  [97.5 F (36.4 C)] 97.5 F (36.4 C) (12/05 0541) Pulse Rate:  [68] 68 (12/05 0541) Resp:  [20] 20 (12/05 0541) BP: (157-173)/(79-83) 157/83 (12/05 0551) SpO2:  [97 %] 97 % (12/05 0541) Weight:  [85.7 kg] 85.7 kg (12/05 0541)  Assessment/Plan  Impression: left shoulder rotator cuff tear arthropathy  Plan of Action: Procedure(s): REVERSE SHOULDER ARTHROPLASTY  Larry Davis Larry Davis 01/19/2018, 6:43 AM Contact # [phone]

## 2018-01-19 NOTE — Discharge Instructions (Signed)
° °Kevin M. Supple, M.D., F.A.A.O.S. °Orthopaedic Surgery °Specializing in Arthroscopic and Reconstructive °Surgery of the Shoulder and Knee °[phone] °[address] - [place], [place] [address] - Fax [phone] ° ° °POST-OP TOTAL SHOULDER REPLACEMENT INSTRUCTIONS ° °1. Call the office at [phone] to schedule your first post-op appointment 10-14 days from the date of your surgery. ° °2. The bandage over your incision is waterproof. You may begin showering with this dressing on. You may leave this dressing on until first follow up appointment within 2 weeks. We prefer you leave this dressing in place until follow up however after 5-7 days if you are having itching or skin irritation and would like to remove it you may do so. Go slow and tug at the borders gently to break the bond the dressing has with the skin. At this point if there is no drainage it is okay to go without a bandage or you may cover it with a light guaze and tape. You can also expect significant bruising around your shoulder that will drift down your arm and into your chest wall. This is very normal and should resolve over several days. ° ° 3. Wear your sling/immobilizer at all times except to perform the exercises below or to occasionally let your arm dangle by your side to stretch your elbow. You also need to sleep in your sling immobilizer until instructed otherwise. ° °4. Range of motion to your elbow, wrist, and hand are encouraged 3-5 times daily. Exercise to your hand and fingers helps to reduce swelling you may experience. ° °5. Utilize ice to the shoulder 3-5 times minimum a day and additionally if you are experiencing pain. ° °6. Prescriptions for a pain medication and a muscle relaxant are provided for you. It is recommended that if you are experiencing pain that you pain medication alone is not controlling, add the muscle relaxant along with the pain medication which can give additional pain relief. The first 1-2 days  is generally the most severe of your pain and then should gradually decrease. As your pain lessens it is recommended that you decrease your use of the pain medications to an "as needed basis'" only and to always comply with the recommended dosages of the pain medications. ° °7. Pain medications can produce constipation along with their use. If you experience this, the use of an over the counter stool softener or laxative daily is recommended.  ° °8. For additional questions or concerns, please do not hesitate to call the office. If after hours there is an answering service to forward your concerns to the physician on call. ° °9.Pain control following an exparel block ° °To help control your post-operative pain you received a nerve block  performed with Exparel which is a long acting anesthetic (numbing agent) which can provide pain relief and sensations of numbness (and relief of pain) in the operative shoulder and arm for up to 3 days. Sometimes it provides mixed relief, meaning you may still have numbness in certain areas of the arm but can still be able to move  parts of that arm, hand, and fingers. We recommend that your prescribed pain medications  be used as needed. We do not feel it is necessary to "pre medicate" and "stay ahead" of pain.  Taking narcotic pain medications when you are not having any pain can lead to unnecessary and potentially dangerous side effects.  ° °POST-OP EXERCISES ° °Pendulum Exercises ° °Perform pendulum exercises while standing and bending at   the waist. Support your uninvolved arm on a table or chair and allow your operated arm to hang freely. Make sure to do these exercises passively - not using you shoulder muscles. ° °Repeat 20 times. Do 3 sessions per day. ° ° ° ° °

## 2018-01-19 NOTE — Transfer of Care (Signed)
Immediate Anesthesia Transfer of Care Note  Patient: Larry Davis  Procedure(s) Performed: REVERSE SHOULDER ARTHROPLASTY (Left Shoulder)  Patient Location: PACU  Anesthesia Type:GA combined with regional for post-op pain  Level of Consciousness: awake and drowsy  Airway & Oxygen Therapy: Patient Spontanous Breathing and Patient connected to nasal cannula oxygen  Post-op Assessment: Report given to RN and Post -op Vital signs reviewed and stable  Post vital signs: Reviewed and stable  Last Vitals:  Vitals Value Taken Time  BP 171/99 01/19/2018  9:46 AM  Temp    Pulse 91 01/19/2018  9:47 AM  Resp 24 01/19/2018  9:47 AM  SpO2 96 % 01/19/2018  9:47 AM  Vitals shown include unvalidated device data.  Last Pain:  Vitals:   01/19/18 0603  TempSrc:   PainSc: 6          Complications: No apparent anesthesia complications

## 2018-01-19 NOTE — Plan of Care (Signed)
  Problem: Pain Managment: Goal: General experience of comfort will improve Outcome: Progressing   Problem: Safety: Goal: Ability to remain free from injury will improve Outcome: Progressing   

## 2018-01-19 NOTE — Op Note (Signed)
01/19/2018  9:36 AM  PATIENT:   Larry Davis  62 y.o. male  PRE-OPERATIVE DIAGNOSIS:  left shoulder rotator cuff tear arthropathy  POST-OPERATIVE DIAGNOSIS: Same  PROCEDURE: Left shoulder reverse arthroplasty utilizing a press-fit size 8 Arthrex stem with a +6 polyethylene insert and a 39/+4 glenosphere on a 24 mm MGS baseplate.  SURGEON:  Marin Shutter M.D.  ASSISTANTS: Jenetta Loges PA-C  ANESTHESIA:   General endotracheal with Exparel interscalene block  EBL: 150 cc  SPECIMEN: None  Drains: None   PATIENT DISPOSITION:  PACU - hemodynamically stable.    PLAN OF CARE: Admit for overnight observation  Brief history:  Patient is a 62 year old gentleman who has had chronic and progressively increasing left shoulder pain and functional limitations related to end stage rotator cuff tear arthropathy.  Due to his increasing pain and function limitations and failure to respond to conservative management he is brought to the operating discomfort plan left shoulder reverse arthroplasty  Preoperatively had counseled the patient regarding treatment options as well as the potential risks versus benefits there.  Possible surgical complications were reviewed including the potential for bleeding, infection, neurovascular injury, persistent pain, anesthetic complication, failure of the implant, and possible need for additional surgery.  He understands and accepts and agrees with our planned procedure.  Procedure in detail:  After undergoing routine preop evaluation patient received prophylactic antibiotics and an interscalene block was established in the holding area by the anesthesia department.  Placed supine on the operative underwent smooth induction of a general endotracheal anesthesia.  Placed into the beachchair position and appropriate padded and protected.  Left shoulder girdle region was sterilely prepped and draped in standard fashion.  Timeout was called.  An anterior  deltopectoral approach left shoulder was made through the 10 cm incision.  Skin flaps were elevated dissection carried deeply and electrocautery was used for hemostasis.  The deltopectoral interval was then developed from proximal to this with a vein taken laterally in the upper centimeter the pectoralis major was tenotomized to improve exposure.  The conjoined tendon was mobilized and retracted medially.  The biceps tendon was then tenodesed at the superior border of the pectoralis major tendon and was then tenotomized and the proximal portion was unroofed and excised.  His subscapularis "peel" was then performed removing the subscap from the lesser tuberosity and the free margin was tagged with a pair of suture tape sutures.  We then divided the capsular attachments from the anteroinferior knee.  Margins of the humeral neck delivering the humeral head through the wound.  We outlined our proposed humeral head resection with the extra medullary guide and this was then completed with the oscillating saw.  A metal cap was then placed over the cut proximal humeral surface we then exposed the glenoid with combination of the appropriate retractors.  I performed a circumferential labral resection gaining complete visualization of the periphery of the glenoid.  A central guidepin was then placed at approximately 10 degree inferior angulation and the glenoid was then prepared with our central followed by the peripheral reamer and then the central drill hole was created and tapped at a 30 mm depth.  We assembled the baseplate on the back table and the baseplate was then inserted with excellent fit fixation.  We then utilized the appropriate guide to place a series of 4 peripheral locking screws all of which obtained excellent purchase and fixation.  A 39/+4 glenosphere was then inserted over the baseplate impacted in position it was  then locked into position with a locking screw.  We then returned our attention back to the  proximal humerus where the humeral canal was then hand reamed and then broached up to a size 8 and then the metaphysis was prepared with a metaphyseal reamer.  Trial reduction showed good soft tissue balance good fit.  Our final humeral implant was then assembled on the back table it was impacted into position and final reductions were again trialed and ultimately +6 polyethylene insert gave [place] the best soft tissue balance.  The final +6 poly-was then impacted into the humeral stem the joint was then copiously irrigated and a final reduction was then performed showing excellent shoulder stability and good motion and good soft tissue balance.  The subscapularis was then mobilized and then repaired back to the collar of the humeral stem utilizing the suture tapes.  The deltopectoral interval was then reapproximated with a series of figure-of-eight #1 Vicryl sutures.  2-0 Vicryl used for the subcu layer and intracuticular 3 Monocryl for the skin followed by Dermabond and Aquasol dressing left arm was placed in a sling the patient awakened explained to her room in stable condition.  Jenetta Loges, PA-C was used as an Environmental consultant throughout this case essential for help with positioning the patient, positioning extremity, tissue manipulation, implantation of the prosthesis, wound closure, and intraoperative decision making.  Marin Shutter MD   Contact # [phone]

## 2018-01-19 NOTE — Anesthesia Procedure Notes (Signed)
Procedure Name: Intubation Date/Time: 01/19/2018 7:44 AM Performed by: Inda Coke, CRNA Pre-anesthesia Checklist: Patient identified, Emergency Drugs available, Suction available and Patient being monitored Patient Re-evaluated:Patient Re-evaluated prior to induction Oxygen Delivery Method: Circle System Utilized Preoxygenation: Pre-oxygenation with 100% oxygen Induction Type: IV induction Ventilation: Mask ventilation without difficulty Laryngoscope Size: Mac and 4 Grade View: Grade I Tube type: Oral Tube size: 7.5 mm Number of attempts: 1 Airway Equipment and Method: Stylet and Oral airway Placement Confirmation: ETT inserted through vocal cords under direct vision,  positive ETCO2 and breath sounds checked- equal and bilateral Secured at: 23 cm Tube secured with: Tape Dental Injury: Teeth and Oropharynx as per pre-operative assessment

## 2018-01-19 NOTE — Anesthesia Procedure Notes (Signed)
Anesthesia Regional Block: Interscalene brachial plexus block   Pre-Anesthetic Checklist: ,, timeout performed, Correct Patient, Correct Site, Correct Laterality, Correct Procedure, Correct Position, site marked, Risks and benefits discussed,  Surgical consent,  Pre-op evaluation,  At surgeon's request and post-op pain management  Laterality: Upper and Left  Prep: Maximum Sterile Barrier Precautions used, chloraprep       Needles:  Injection technique: Single-shot  Needle Type: Echogenic Needle     Needle Length: 5cm  Needle Gauge: 21     Additional Needles:   Procedures:,,,, ultrasound used (permanent image in chart),,,,  Narrative:  Start time: 01/19/2018 7:14 AM End time: 01/19/2018 7:22 AM Injection made incrementally with aspirations every 5 mL.  Performed by: Personally  Anesthesiologist: Barnet Glasgow, MD  Additional Notes: Block assessed prior to procedure. Patient tolerated procedure well.

## 2018-01-19 NOTE — Anesthesia Postprocedure Evaluation (Signed)
Anesthesia Post Note  Patient: NYSIR FERGUSSON  Procedure(s) Performed: REVERSE SHOULDER ARTHROPLASTY (Left Shoulder)     Patient location during evaluation: PACU Anesthesia Type: General Level of consciousness: awake and alert Pain management: pain level controlled Vital Signs Assessment: post-procedure vital signs reviewed and stable Respiratory status: spontaneous breathing, nonlabored ventilation, respiratory function stable and patient connected to nasal cannula oxygen Cardiovascular status: blood pressure returned to baseline and stable Postop Assessment: no apparent nausea or vomiting Anesthetic complications: no    Last Vitals:  Vitals:   01/19/18 1301 01/19/18 1316  BP: 119/74 124/71  Pulse: 77 70  Resp: 15 16  Temp:  36.8 C  SpO2: 94% 93%    Last Pain:  Vitals:   01/19/18 1835  TempSrc:   PainSc: 2                  Barnet Glasgow

## 2018-01-20 ENCOUNTER — Encounter (HOSPITAL_COMMUNITY): Payer: Self-pay | Admitting: Orthopedic Surgery

## 2018-01-20 DIAGNOSIS — M75102 Unspecified rotator cuff tear or rupture of left shoulder, not specified as traumatic: Secondary | ICD-10-CM | POA: Diagnosis not present

## 2018-01-20 DIAGNOSIS — Z791 Long term (current) use of non-steroidal anti-inflammatories (NSAID): Secondary | ICD-10-CM | POA: Diagnosis not present

## 2018-01-20 DIAGNOSIS — Z87891 Personal history of nicotine dependence: Secondary | ICD-10-CM | POA: Diagnosis not present

## 2018-01-20 DIAGNOSIS — Z7984 Long term (current) use of oral hypoglycemic drugs: Secondary | ICD-10-CM | POA: Diagnosis not present

## 2018-01-20 DIAGNOSIS — E119 Type 2 diabetes mellitus without complications: Secondary | ICD-10-CM | POA: Diagnosis not present

## 2018-01-20 DIAGNOSIS — Z7982 Long term (current) use of aspirin: Secondary | ICD-10-CM | POA: Diagnosis not present

## 2018-01-20 DIAGNOSIS — Z79899 Other long term (current) drug therapy: Secondary | ICD-10-CM | POA: Diagnosis not present

## 2018-01-20 LAB — GLUCOSE, CAPILLARY
Glucose-Capillary: 159 mg/dL — ABNORMAL HIGH (ref 70–99)
Glucose-Capillary: 241 mg/dL — ABNORMAL HIGH (ref 70–99)

## 2018-01-20 MED ORDER — TRAMADOL HCL 50 MG PO TABS: 50.0000 mg | ORAL_TABLET | Freq: Four times a day (QID) | ORAL | 0 refills | Status: DC | PRN

## 2018-01-20 MED ORDER — CYCLOBENZAPRINE HCL 10 MG PO TABS: 10.0000 mg | ORAL_TABLET | Freq: Three times a day (TID) | ORAL | 1 refills | Status: DC | PRN

## 2018-01-20 MED ORDER — ONDANSETRON HCL 4 MG PO TABS: 4.0000 mg | ORAL_TABLET | Freq: Three times a day (TID) | ORAL | 0 refills | Status: DC | PRN

## 2018-01-20 MED ORDER — OXYCODONE-ACETAMINOPHEN 5-325 MG PO TABS: 1.0000 | ORAL_TABLET | ORAL | 0 refills | Status: DC | PRN

## 2018-01-20 MED FILL — OXYCODONE-ACETAMINOPHEN 5-3: 5-325 | 5 days supply | Qty: 30 | Fill #0

## 2018-01-20 MED FILL — traMADol HCL 50 MG TABS: 50 | 7 days supply | Qty: 30 | Fill #0

## 2018-01-20 MED FILL — CYCLOBENZAPRINE HCL 10 MG T: 10 | 10 days supply | Qty: 30 | Fill #0

## 2018-01-20 MED FILL — ONDANSETRON HCL 4 MG TABLET: 4 | 3 days supply | Qty: 10 | Fill #0

## 2018-01-20 NOTE — Evaluation (Signed)
Occupational Therapy Evaluation and Discharge Patient Details Name: Larry Davis MRN: [id number] DOB: [birth year] Today's Date: 01/20/2018    History of Present Illness 62 y/o s/p L reverse TSA. Pt has a PMH including Diabetes mellitus without complication, Prostate cancer (03/2014), Shoulder surgery (Right, 1990).   Clinical Impression   PTA Pt independent in ADL and mobility. Enjoys outdoor activities, drives etc. Shoulder precautions reviewed in full with demonstration and teach back (handout provided): Educated patient on don doff sling with return demonstration, shoulder movement for functional tasks within range provided by MD (see exercises below), positioning with pillows in chair for comfort and come out of sling to give neck a break, sleeping positioning (recommend recliner if possible), pt educated on seqyuence/compensatory strategies for during bathing/dressing.  Home exercise program educated as stated below (indicated by MD). Education complete. OT to defer further rehab of shoulder to MD at follow up. Thank you for the opportunity to serve this patient.      Follow Up Recommendations  Follow surgeon's recommendation for DC plan and follow-up therapies    Equipment Recommendations  None recommended by OT    Recommendations for Other Services       Precautions / Restrictions Precautions Precautions: Shoulder Type of Shoulder Precautions: active protocol Shoulder Interventions: Shoulder sling/immobilizer;At all times;Off for dressing/bathing/exercises Precaution Booklet Issued: Yes (comment) Precaution Comments: reviewed dc handout in full Required Braces or Orthoses: Sling Restrictions Weight Bearing Restrictions: Yes LUE Weight Bearing: Non weight bearing      Mobility Bed Mobility Overal bed mobility: Independent                Transfers Overall transfer level: Independent                    Balance Overall balance assessment: Independent                                          ADL either performed or assessed with clinical judgement   ADL                                         General ADL Comments: see shoulder section below     Vision         Perception     Praxis      Pertinent Vitals/Pain Pain Assessment: 0-10 Pain Score: 1  Pain Location: L shoulder Pain Descriptors / Indicators: Dull Pain Intervention(s): Limited activity within patient's tolerance;Monitored during session     Hand Dominance Right   Extremity/Trunk Assessment Upper Extremity Assessment Upper Extremity Assessment: LUE deficits/detail LUE Deficits / Details: deficits as anticipated post-op LUE Sensation: decreased light touch(block still in place) LUE Coordination: decreased gross motor   Lower Extremity Assessment Lower Extremity Assessment: Overall WFL for tasks assessed   Cervical / Trunk Assessment Cervical / Trunk Assessment: Normal   Communication Communication Communication: No difficulties   Cognition Arousal/Alertness: Awake/alert Behavior During Therapy: WFL for tasks assessed/performed Overall Cognitive Status: Within Functional Limits for tasks assessed                                     General Comments       Exercises Exercises: Shoulder Shoulder Exercises  Pendulum Exercise: AROM;Left;10 reps Shoulder Flexion: PROM;AROM;AAROM;Left(within pain tolerance, for functional ADL, educated to 60) Shoulder ABduction: PROM;AROM;AAROM;Left(within pain tolerance, for functional ADL, educated to 45) Shoulder External Rotation: PROM;AROM;AAROM;Left(within pain tolerance, for functional ADL, educated to 20) Elbow Flexion: AROM;Left;10 reps Elbow Extension: AROM;Left;10 reps Wrist Flexion: AROM;Left Wrist Extension: AROM;Left Digit Composite Flexion: AROM;Left Composite Extension: AROM;Left Neck Flexion: AROM Neck Extension: AROM Neck Lateral Flexion - Right:  AROM Neck Lateral Flexion - Left: AROM   Shoulder Instructions Shoulder Instructions Donning/doffing shirt without moving shoulder: Maximal assistance Method for sponge bathing under operated UE: Supervision/safety Donning/doffing sling/immobilizer: Maximal assistance Correct positioning of sling/immobilizer: Moderate assistance Pendulum exercises (written home exercise program): (educated in gentle pendulums and lap slides) ROM for elbow, wrist and digits of operated UE: Independent Sling wearing schedule (on at all times/off for ADL's): Independent Proper positioning of operated UE when showering: Supervision/safety Positioning of UE while sleeping: Minimal assistance    Home Living Family/patient expects to be discharged to:: Private residence Living Arrangements: Spouse/significant other Available Help at Discharge: Family;Friend(s) Type of Home: House Home Access: Level entry     Home Layout: Able to live on main level with bedroom/bathroom     Bathroom Shower/Tub: Occupational psychologist: Standard     Home Equipment: None          Prior Functioning/Environment Level of Independence: Independent                 OT Problem List: Decreased range of motion;Decreased activity tolerance;Decreased knowledge of precautions;Impaired sensation;Impaired UE functional use;Pain      OT Treatment/Interventions:      OT Goals(Current goals can be found in the care plan section) Acute Rehab OT Goals Patient Stated Goal: get back to hunting/outdoor activities OT Goal Formulation: With patient Time For Goal Achievement: 02/03/18 Potential to Achieve Goals: Good  OT Frequency:     Barriers to D/C:            Co-evaluation              AM-PAC OT "6 Clicks" Daily Activity     Outcome Measure Help from another person eating meals?: A Little Help from another person taking care of personal grooming?: A Little Help from another person toileting, which  includes using toliet, bedpan, or urinal?: A Little Help from another person bathing (including washing, rinsing, drying)?: A Little Help from another person to put on and taking off regular upper body clothing?: A Lot Help from another person to put on and taking off regular lower body clothing?: A Little 6 Click Score: 17   End of Session Equipment Utilized During Treatment: Other (comment)(sling) Nurse Communication: Mobility status;Weight bearing status  Activity Tolerance: Patient tolerated treatment well Patient left: in chair  OT Visit Diagnosis: Pain Pain - Right/Left: Left Pain - part of body: Shoulder                Time: 2025-4270 OT Time Calculation (min): 30 min Charges:  OT General Charges $OT Visit: 1 Visit OT Evaluation $OT Eval Moderate Complexity: 1 Mod OT Treatments $Self Care/Home Management : 8-22 mins  Hulda Humphrey OTR/L Acute Rehabilitation Services Pager: [phone] Office: [place] 01/20/2018, 10:09 AM

## 2018-01-20 NOTE — Discharge Summary (Signed)
PATIENT ID:      Larry Davis  MRN:     [id number] DOB/AGE:    06/12/1955 / 62 y.o.     DISCHARGE SUMMARY  ADMISSION DATE:    01/19/2018 DISCHARGE DATE:    ADMISSION DIAGNOSIS: left shoulder rotator cuff tear arthropathy Past Medical History:  Diagnosis Date  . Allergy   . 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 Diabetes mellitus without complication ([place])   . PONV (postoperative nausea and vomiting)   . Prostate cancer ([place]) 03/2014  . Urticaria     DISCHARGE DIAGNOSIS:   Active Problems:   S/P reverse total shoulder arthroplasty, left   PROCEDURE: Procedure(s): REVERSE SHOULDER ARTHROPLASTY on 01/19/2018  CONSULTS:    HISTORY:  See H&P in chart.  HOSPITAL COURSE:  Larry Davis is a 62 y.o. admitted on 01/19/2018 with a diagnosis of left shoulder rotator cuff tear arthropathy.  They were brought to the operating room on 01/19/2018 and underwent Procedure(s): [place].    They were given perioperative antibiotics:  Anti-infectives (From admission, onward)   Start     Dose/Rate Route Frequency Ordered Stop   01/19/18 0600  ceFAZolin (ANCEF) IVPB 2g/100 mL premix     2 g 200 mL/hr over 30 Minutes Intravenous On call to O.R. 01/19/18 0547 01/19/18 0745   01/19/18 0554  ceFAZolin (ANCEF) 2-4 GM/100ML-% IVPB    Note to Pharmacy:  Ardine Eng   : cabinet override      01/19/18 0554 01/19/18 0745    .  Patient underwent the above named procedure and tolerated it well. The following day they were hemodynamically stable and pain was controlled on oral analgesics. They were neurovascularly intact to the operative extremity. OT was ordered and worked with patient per protocol. They were medically and orthopaedically stable for discharge on day 1 .    DIAGNOSTIC STUDIES:  RECENT RADIOGRAPHIC STUDIES :  No results found.  RECENT VITAL SIGNS:   Patient Vitals for the past 24 hrs:  BP Temp Temp src Pulse Resp SpO2  01/20/18 0847 133/67 97.9 F  (36.6 C) - 65 19 95 %  01/20/18 0400 (!) 120/57 (!) 97.4 F (36.3 C) Oral (!) 55 20 93 %  01/20/18 0019 113/66 98.2 F (36.8 C) Oral 62 16 92 %  01/19/18 1316 124/71 98.3 F (36.8 C) Oral 70 16 93 %  01/19/18 1301 119/74 - - 77 15 94 %  01/19/18 1245 123/80 97.6 F (36.4 C) - 80 20 94 %  01/19/18 1215 131/76 - - 70 15 94 %  01/19/18 1145 132/87 - - 72 19 93 %  01/19/18 1115 134/84 - - 72 18 92 %  01/19/18 1045 (!) 141/82 - - 78 15 97 %  01/19/18 1030 (!) 143/80 - - 73 14 94 %  01/19/18 1015 (!) 155/88 - - 74 18 95 %  01/19/18 1000 (!) 141/87 - - 78 19 95 %  01/19/18 0945 (!) 164/89 (!) 97.5 F (36.4 C) - 90 20 97 %  .  RECENT EKG RESULTS:    Orders placed or performed in visit on 02/23/17  . EKG 12-Lead    DISCHARGE INSTRUCTIONS:    DISCHARGE MEDICATIONS:   Allergies as of 01/20/2018      Reactions   Bee Venom Anaphylaxis, Other (See Comments)   Throat swells   Beef-derived Products Anaphylaxis      Medication List    TAKE these medications   aspirin EC 325 MG tablet  Take 1 tablet (325 mg total) by mouth daily.   cyclobenzaprine 10 MG tablet Commonly known as:  FLEXERIL Take 1 tablet (10 mg total) by mouth 3 (three) times daily as needed for muscle spasms.   diclofenac sodium 1 % Gel Commonly known as:  VOLTAREN Apply 1 application topically daily.   EPINEPHrine 0.3 mg/0.3 mL Soaj injection Commonly known as:  EPI-PEN Inject 0.3 mLs (0.3 mg total) into the muscle once for 1 dose.   freestyle lancets Use to check blood sugars up to 3 times daily   FREESTYLE LITE Devi Use as directed   glucose blood test strip Use to check blood sugars up to 3 times daily   INVOKANA 300 MG Tabs tablet Generic drug:  canagliflozin TAKE 1 TABLET BY MOUTH DAILY BEFORE BREAKFAST. What changed:  See the new instructions.   JANUVIA 100 MG tablet Generic drug:  sitaGLIPtin TAKE 1 TABLET BY MOUTH DAILY   meloxicam 7.5 MG tablet Commonly known as:  MOBIC Take 1 tablet  (7.5 mg total) by mouth 2 (two) times daily.   metFORMIN 500 MG 24 hr tablet Commonly known as:  GLUCOPHAGE-XR Take 2 tablets (1,000 mg total) 2 (two) times daily with a meal by mouth.   ondansetron 4 MG tablet Commonly known as:  ZOFRAN Take 1 tablet (4 mg total) by mouth every 8 (eight) hours as needed for nausea or vomiting.   oxyCODONE-acetaminophen 5-325 MG tablet Commonly known as:  PERCOCET/ROXICET Take 1 tablet by mouth every 4 (four) hours as needed (max 6 q).   traMADol 50 MG tablet Commonly known as:  ULTRAM Take 1 tablet (50 mg total) by mouth every 6 (six) hours as needed. What changed:  when to take this   traZODone 50 MG tablet Commonly known as:  DESYREL Take 0.5-1 tablets (25-50 mg total) by mouth at bedtime as needed for sleep.       FOLLOW UP VISIT:   Follow-up Information    Justice Britain, MD.   Specialty:  Orthopedic Surgery Why:  call to be seen in 10-14 days Contact information: [address] [place] [address] [phone]           DISCHARGE TO: Home   DISCHARGE CONDITION:  Thereasa Parkin Cassey Bacigalupo for Dr. Lennette Bihari Supple 01/20/2018, 9:00 AM

## 2018-01-20 NOTE — Progress Notes (Signed)
AVS given and reviewed with pt. Medications reviewed and discussed. Sling in place. All questions answered to satisfaction. Pt to be escorted off the unit via wheelchair by volunteer services.

## 2018-01-31 ENCOUNTER — Other Ambulatory Visit: Payer: Self-pay | Admitting: Internal Medicine

## 2018-01-31 MED ORDER — SITAGLIPTIN PHOSPHATE 100 MG PO TABS: 100.0000 mg | ORAL_TABLET | Freq: Every day | ORAL | 0 refills | Status: DC

## 2018-01-31 MED FILL — metFORMIN HCL ER 500 MG TB2: 500 | 90 days supply | Qty: 360 | Fill #0

## 2018-01-31 MED FILL — JANUVIA 100 MG TABLET: 100 | 30 days supply | Qty: 30 | Fill #1

## 2018-01-31 MED FILL — INVOKANA 300 MG TABLET: 300 | 30 days supply | Qty: 30 | Fill #1

## 2018-01-31 MED FILL — MELOXICAM 15 MG TABLET: 15 | 30 days supply | Qty: 30 | Fill #1

## 2018-02-03 DIAGNOSIS — Z96612 Presence of left artificial shoulder joint: Secondary | ICD-10-CM | POA: Diagnosis not present

## 2018-02-03 DIAGNOSIS — M25512 Pain in left shoulder: Secondary | ICD-10-CM | POA: Diagnosis not present

## 2018-02-03 DIAGNOSIS — I890 Lymphedema, not elsewhere classified: Secondary | ICD-10-CM | POA: Diagnosis not present

## 2018-02-03 DIAGNOSIS — M19112 Post-traumatic osteoarthritis, left shoulder: Secondary | ICD-10-CM | POA: Diagnosis not present

## 2018-02-03 DIAGNOSIS — Z471 Aftercare following joint replacement surgery: Secondary | ICD-10-CM | POA: Diagnosis not present

## 2018-02-06 DIAGNOSIS — M19112 Post-traumatic osteoarthritis, left shoulder: Secondary | ICD-10-CM | POA: Diagnosis not present

## 2018-02-06 DIAGNOSIS — Z471 Aftercare following joint replacement surgery: Secondary | ICD-10-CM | POA: Diagnosis not present

## 2018-02-06 DIAGNOSIS — M19111 Post-traumatic osteoarthritis, right shoulder: Secondary | ICD-10-CM | POA: Diagnosis not present

## 2018-02-06 DIAGNOSIS — Z96612 Presence of left artificial shoulder joint: Secondary | ICD-10-CM | POA: Diagnosis not present

## 2018-02-14 ENCOUNTER — Ambulatory Visit: Payer: 59 | Attending: Orthopedic Surgery | Admitting: Physical Therapy

## 2018-02-14 ENCOUNTER — Other Ambulatory Visit: Payer: Self-pay

## 2018-02-14 DIAGNOSIS — M25612 Stiffness of left shoulder, not elsewhere classified: Secondary | ICD-10-CM | POA: Diagnosis not present

## 2018-02-14 DIAGNOSIS — R293 Abnormal posture: Secondary | ICD-10-CM | POA: Diagnosis not present

## 2018-02-14 DIAGNOSIS — M6281 Muscle weakness (generalized): Secondary | ICD-10-CM | POA: Insufficient documentation

## 2018-02-14 DIAGNOSIS — M25512 Pain in left shoulder: Secondary | ICD-10-CM | POA: Diagnosis not present

## 2018-02-14 DIAGNOSIS — G8929 Other chronic pain: Secondary | ICD-10-CM | POA: Diagnosis not present
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
## 2018-02-21 ENCOUNTER — Ambulatory Visit: Payer: 59 | Attending: Orthopedic Surgery | Admitting: Physical Therapy

## 2018-02-21 ENCOUNTER — Encounter: Payer: Self-pay | Admitting: Physical Therapy

## 2018-02-21 DIAGNOSIS — R293 Abnormal posture: Secondary | ICD-10-CM | POA: Diagnosis not present

## 2018-02-21 DIAGNOSIS — M25512 Pain in left shoulder: Secondary | ICD-10-CM | POA: Insufficient documentation

## 2018-02-21 DIAGNOSIS — G8929 Other chronic pain: Secondary | ICD-10-CM | POA: Insufficient documentation

## 2018-02-21 DIAGNOSIS — M25612 Stiffness of left shoulder, not elsewhere classified: Secondary | ICD-10-CM | POA: Insufficient documentation

## 2018-02-21 DIAGNOSIS — M6281 Muscle weakness (generalized): Secondary | ICD-10-CM | POA: Diagnosis not present

## 2018-02-21 NOTE — Therapy (Signed)
[facility] [address]  [place], [place], [address] Phone: [phone]   Fax:  [phone]  Physical Therapy Treatment  Patient Details  Name: Larry Davis MRN: [id number] Date of Birth: Feb 22, 1955 Referring Provider (PT): Justice Britain, MD   Encounter Date: 02/21/2018  PT End of Session - 02/21/18 0852    Visit Number  2    Number of Visits  16    Date for PT Re-Evaluation  04/11/18    Authorization Type  [facility]    PT Start Time  0847    PT Stop Time  0930    PT Time Calculation (min)  43 min    Activity Tolerance  Patient tolerated treatment well    Behavior During Therapy  [facility] for tasks assessed/performed       Past Medical History:  Diagnosis Date  . Allergy   . 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 Diabetes mellitus without complication ([place])   . PONV (postoperative nausea and vomiting)   . Prostate cancer ([place]) 03/2014  . Urticaria     Past Surgical History:  Procedure Laterality Date  . ADENOIDECTOMY    . CHOLECYSTECTOMY  2000  . COLONOSCOPY    . LYMPHADENECTOMY Bilateral 06/14/2014   Procedure: PELVIC LYMPH NODE DISSECTION;  Surgeon: Alexis Frock, MD;  Location: [facility];  Service: Urology;  Laterality: Bilateral;  . REVERSE SHOULDER ARTHROPLASTY Left 01/19/2018  . REVERSE SHOULDER ARTHROPLASTY Left 01/19/2018   Procedure: REVERSE SHOULDER ARTHROPLASTY;  Surgeon: Justice Britain, MD;  Location: [facility];  Service: Orthopedics;  Laterality: Left;  140min  Please move 7:30am [place] to 10am  . ROBOT ASSISTED LAPAROSCOPIC RADICAL PROSTATECTOMY N/A 06/14/2014   Procedure: ROBOTIC ASSISTED LAPAROSCOPIC RADICAL PROSTATECTOMY WITH INDOCYANINE GREEN DYE INJECTION;  Surgeon: Alexis Frock, MD;  [facility];  Service: Urology;  Laterality: N/A;  . SHOULDER SURGERY Right 1990   arthroscopy  . TONSILLECTOMY      There were no vitals filed for this visit.  Subjective Assessment - 02/21/18 0849     Subjective  Pt reports he has been able to contorl the pain with his meloxicam. Reports he went to the gym and did some stretching and light pulling activites with both arms - states he felt good after.    Pertinent History  L reverse TSA 01/19/18    Patient Stated Goals  "improve flexibility & ROM"    Currently in Pain?  Yes    Pain Score  2     Pain Location  Shoulder    Pain Orientation  Left    Pain Descriptors / Indicators  Aching    Pain Type  Surgical pain    Pain Frequency  Constant                       [facility] Adult PT Treatment/Exercise - 02/21/18 0847      Exercises   Exercises  Shoulder      Shoulder Exercises: Supine   Protraction  Left;10 reps;AROM    Protraction Limitations  verbal & tactile cues to avoid shoulder hike    External Rotation  Left;AROM;10 reps    External Rotation Limitations  neutral to 45 dg; arm supported on pillow at ~45 dg abduction    Internal Rotation  Left;AROM;10 reps    Internal Rotation Limitations  neutral to tolerance; arm supported on pillow at ~45 dg abduction    Flexion  Left;AAROM;AROM;10 reps  Flexion Limitations  1 set each - AAROM with wand, AROM with cues & manual A for scapular retraction & depression    Other Supine Exercises  L shoulder small CW/CCW circles x 10 each      Shoulder Exercises: Prone   Retraction  Left;AROM;10 reps    Retraction Limitations  + row to neutral    Extension  Left;AROM;10 reps    Extension Limitations  + scap retraction to neutral      Shoulder Exercises: Sidelying   ABduction  Left;AROM;10 reps    ABduction Limitations  20 - 90 dg in scapular plane      Shoulder Exercises: Pulleys   Flexion  3 minutes    Scaption  3 minutes      Shoulder Exercises: Isometric Strengthening   External Rotation  5X5"   2 sets   External Rotation Limitations  into towel on edge of doorframe - cues for neutral shoulder & scapular engagement    ABduction  5X5"   2 sets   ABduction Limitations   into towel on wall      Vasopneumatic   Number Minutes Vasopneumatic   --   pt declined     Manual Therapy   Manual Therapy  Soft tissue mobilization;Myofascial release    Manual therapy comments  supine    Soft tissue mobilization  L pecs, ant/mid deltoid & biceps    Myofascial Release  manual TPR to anterior & lateral pecs               PT Short Term Goals - 02/21/18 0853      PT SHORT TERM GOAL #1   Title  Independent with initial HEP    Status  On-going      PT SHORT TERM GOAL #2   Title  L shoulder ER >/= 45 dg    Status  On-going        PT Long Term Goals - 02/21/18 2774      PT LONG TERM GOAL #1   Title  Independent with ongoing HEP    Status  On-going      PT LONG TERM GOAL #2   Title  L shoulder flexion & abduction AROM WFL w/o increased pain    Status  On-going      PT LONG TERM GOAL #3   Title  L shoulder strength grossly >/= 4/5 for improved functional use of L UE    Status  On-going      PT LONG TERM GOAL #4   Title  Pt will report ability to use L UE for basic ADLs and light household chores w/o increased pain    Status  On-going            Plan - 02/21/18 0854    Clinical Impression Statement  [name] reporting no issues with HEP but admitting to trying more activities at the gym - cautioned pt to stick with HEP and therapy prescribed exercises only for L shoulder at this stage to avoid over stressing healing tissue. Introduced supine AROM in limited ranges focusing on scapular activation and good control with pt experiencing greatest difficulty with IR & ER. HEP not updated yet to avoid encouraging pt to go beyond desired ROM w/o good control. Pt declining vasopneumatic compression today, but encouraged to ice at home to minimize post-exercise soreness/edema.    Rehab Potential  Good    PT Frequency  2x / week   most likely tapering to 1x/wk after  4-6 wks   PT Duration  8 weeks    PT Treatment/Interventions  Patient/family  education;Neuromuscular re-education;Therapeutic exercise;Therapeutic activities;Manual techniques;Passive range of motion;Electrical Stimulation;Moist Heat;Cryotherapy;Vasopneumatic Device;Dry needling;Iontophoresis 4mg /ml Dexamethasone;ADLs/Self Care Home Management    PT Next Visit Plan  L reverse TSA protocol    Consulted and Agree with Plan of Care  Patient       Patient will benefit from skilled therapeutic intervention in order to improve the following deficits and impairments:  Pain, Decreased range of motion, Decreased strength, Impaired UE functional use, Impaired flexibility, Increased muscle spasms, Decreased activity tolerance  Visit Diagnosis: Chronic left shoulder pain  Stiffness of left shoulder, not elsewhere classified  Muscle weakness (generalized)  Abnormal posture     Problem List Patient Active Problem List   Diagnosis Date Noted  . 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 Rotator cuff tear arthropathy 05/30/2017  . Abnormal electrocardiogram (ECG) (EKG) 03/14/2017  . Overweight (BMI 25.0-29.9) 09/23/2016  . 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 Shoulder joint pain 08/15/2015  . Hyperlipidemia 03/18/2015  . Prostate cancer ([place]) 05/12/2014  . Perennial allergic rhinitis 05/12/2014  . Insomnia 05/12/2014  . Type 2 diabetes mellitus with hyperglycemia, without long-term current use of insulin ([place]) 05/11/2014    Percival Spanish, PT, MPT 02/21/2018, 9:50 AM  [facility] [address]  [place], [place], [address] Phone: [phone]   Fax:  [phone]  Name: DUELL HOLDREN MRN: [id number] Date of Birth: 17-Oct-1955

## 2018-02-23 ENCOUNTER — Encounter: Payer: Self-pay | Admitting: Physical Therapy

## 2018-02-23 ENCOUNTER — Ambulatory Visit: Payer: 59 | Admitting: Physical Therapy

## 2018-02-23 DIAGNOSIS — R293 Abnormal posture: Secondary | ICD-10-CM | POA: Diagnosis not present

## 2018-02-23 DIAGNOSIS — G8929 Other chronic pain: Secondary | ICD-10-CM | POA: Diagnosis not present

## 2018-02-23 DIAGNOSIS — M25612 Stiffness of left shoulder, not elsewhere classified: Secondary | ICD-10-CM

## 2018-02-23 DIAGNOSIS — M6281 Muscle weakness (generalized): Secondary | ICD-10-CM | POA: Diagnosis not present

## 2018-02-23 DIAGNOSIS — M25512 Pain in left shoulder: Secondary | ICD-10-CM | POA: Diagnosis not present

## 2018-02-23 NOTE — Therapy (Signed)
[facility] [address]  [place], [place], [address] Phone: [phone]   Fax:  [phone]  Physical Therapy Treatment  Patient Details  Name: Larry Davis MRN: [id number] Date of Birth: November 11, 1955 Referring Provider (PT): Justice Britain, MD   Encounter Date: 02/23/2018  PT End of Session - 02/23/18 1314    Visit Number  3    Number of Visits  16    Date for PT Re-Evaluation  04/11/18    Authorization Type  [facility]    PT Start Time  1314    PT Stop Time  1404    PT Time Calculation (min)  50 min    Activity Tolerance  Patient tolerated treatment well    Behavior During Therapy  [facility] for tasks assessed/performed       Past Medical History:  Diagnosis Date  . Allergy   . 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 Diabetes mellitus without complication ([place])   . PONV (postoperative nausea and vomiting)   . Prostate cancer ([place]) 03/2014  . Urticaria     Past Surgical History:  Procedure Laterality Date  . ADENOIDECTOMY    . CHOLECYSTECTOMY  2000  . COLONOSCOPY    . LYMPHADENECTOMY Bilateral 06/14/2014   Procedure: PELVIC LYMPH NODE DISSECTION;  Surgeon: Alexis Frock, MD;  Location: [facility];  Service: Urology;  Laterality: Bilateral;  . REVERSE SHOULDER ARTHROPLASTY Left 01/19/2018  . REVERSE SHOULDER ARTHROPLASTY Left 01/19/2018   Procedure: REVERSE SHOULDER ARTHROPLASTY;  Surgeon: Justice Britain, MD;  Location: [place];  Service: Orthopedics;  Laterality: Left;  152min  Please move 7:30am [place] to 10am  . ROBOT ASSISTED LAPAROSCOPIC RADICAL PROSTATECTOMY N/A 06/14/2014   Procedure: ROBOTIC ASSISTED LAPAROSCOPIC RADICAL PROSTATECTOMY WITH INDOCYANINE GREEN DYE INJECTION;  Surgeon: Alexis Frock, MD;  Location[facility];  Service: Urology;  Laterality: N/A;  . SHOULDER SURGERY Right 1990   arthroscopy  . TONSILLECTOMY      There were no vitals filed for this visit.  Subjective Assessment - 02/23/18 1318     Subjective  Pt reporting increased soreness today. Reluctantly admits after telling PT "you can't write this down" that he "popped" his L shoulder out and had to self-reduce it yesterday - states he was holding onto a door while walking through and the door did move as far as he anticipated, causing his shoulder to dislocate. Pt did not notify MD and does not want PT to do so either.    Pertinent History  L reverse TSA 01/19/18    Patient Stated Goals  "improve flexibility & ROM"    Currently in Pain?  Yes    Pain Score  4     Pain Location  Shoulder    Pain Orientation  Left    Pain Descriptors / Indicators  Dull   "muscular"   Pain Type  Surgical pain;Acute pain                       [facility] Adult PT Treatment/Exercise - 02/23/18 1314      Shoulder Exercises: Supine   Protraction  Left;10 reps;AROM    External Rotation  Left;10 reps;AROM    External Rotation Limitations  neutral to 45 dg; arm supported on pillow at ~45 dg abduction    Internal Rotation  Left;10 reps;AROM    Internal Rotation Limitations  neutral to tolerance; arm supported on pillow at ~45 dg abduction    Flexion  Left;10 reps;AROM  Flexion Limitations  verbal & tactile cues for scapular retraction & depression    Other Supine Exercises  L shoulder small CW/CCW circles x 10 each      Shoulder Exercises: Pulleys   Flexion  3 minutes    Scaption  3 minutes      Modalities   Modalities  Vasopneumatic      Vasopneumatic   Number Minutes Vasopneumatic   10 minutes    Vasopnuematic Location   Shoulder   Left   Vasopneumatic Pressure  Low    Vasopneumatic Temperature   coldest      Manual Therapy   Manual Therapy  Soft tissue mobilization;Myofascial release    Manual therapy comments  supine    Soft tissue mobilization  L pecs, ant/mid deltoid, biceps & posterior shoulder complex    Myofascial Release  manual TPR to teres group & lats               PT Short Term Goals - 02/21/18 8921       PT SHORT TERM GOAL #1   Title  Independent with initial HEP    Status  On-going      PT SHORT TERM GOAL #2   Title  L shoulder ER >/= 45 dg    Status  On-going        PT Long Term Goals - 02/21/18 1941      PT LONG TERM GOAL #1   Title  Independent with ongoing HEP    Status  On-going      PT LONG TERM GOAL #2   Title  L shoulder flexion & abduction AROM WFL w/o increased pain    Status  On-going      PT LONG TERM GOAL #3   Title  L shoulder strength grossly >/= 4/5 for improved functional use of L UE    Status  On-going      PT LONG TERM GOAL #4   Title  Pt will report ability to use L UE for basic ADLs and light household chores w/o increased pain    Status  On-going            Plan - 02/23/18 1401    Clinical Impression Statement  [name] reporting increased soreness to day after accidental trauma to L shoulder where pt describes dislocating his shoulder when it caught on a door. Increased muscle tension noted t/o shoulder complex but no evidence of instability noted today - address increased muscle tension and TPs with manual therapy with good relief noted. Remained very conservative with therapeutic exercises today given recent trauma and increased soreness, with all exercises well tolerated. Treatment concluded with vasopneumatic compression to further reduce pain and irritation. Will resume progression per reverse TSA protocol as tolerated on next visit.    Rehab Potential  Good    PT Frequency  2x / week   most likely tapering to 1x/wk after 4-6 wks   PT Duration  8 weeks    PT Treatment/Interventions  Patient/family education;Neuromuscular re-education;Therapeutic exercise;Therapeutic activities;Manual techniques;Passive range of motion;Electrical Stimulation;Moist Heat;Cryotherapy;Vasopneumatic Device;Dry needling;Iontophoresis 4mg /ml Dexamethasone;ADLs/Self Care Home Management    PT Next Visit Plan  L reverse TSA protocol    Consulted and Agree with Plan  of Care  Patient       Patient will benefit from skilled therapeutic intervention in order to improve the following deficits and impairments:  Pain, Decreased range of motion, Decreased strength, Impaired UE functional use, Impaired flexibility, Increased muscle spasms, Decreased  activity tolerance  Visit Diagnosis: Chronic left shoulder pain  Stiffness of left shoulder, not elsewhere classified  Muscle weakness (generalized)  Abnormal posture     Problem List Patient Active Problem List   Diagnosis Date Noted  . 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 Rotator cuff tear arthropathy 05/30/2017  . Abnormal electrocardiogram (ECG) (EKG) 03/14/2017  . Overweight (BMI 25.0-29.9) 09/23/2016  . 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 Shoulder joint pain 08/15/2015  . Hyperlipidemia 03/18/2015  . Prostate cancer ([place]) 05/12/2014  . Perennial allergic rhinitis 05/12/2014  . Insomnia 05/12/2014  . Type 2 diabetes mellitus with hyperglycemia, without long-term current use of insulin ([place]) 05/11/2014    Percival Spanish, PT, MPT 02/23/2018, 4:16 PM  [facility] [address]  [place], [place], [address] Phone: [phone]   Fax:  [phone]  Name: Larry Davis MRN: [id number] Date of Birth: 1955/07/25

## 2018-02-28 ENCOUNTER — Ambulatory Visit: Payer: 59

## 2018-02-28 DIAGNOSIS — M25612 Stiffness of left shoulder, not elsewhere classified: Secondary | ICD-10-CM

## 2018-02-28 DIAGNOSIS — M25512 Pain in left shoulder: Principal | ICD-10-CM

## 2018-02-28 DIAGNOSIS — G8929 Other chronic pain: Secondary | ICD-10-CM | POA: Diagnosis not present

## 2018-02-28 DIAGNOSIS — R293 Abnormal posture: Secondary | ICD-10-CM | POA: Diagnosis not present

## 2018-02-28 DIAGNOSIS — M6281 Muscle weakness (generalized): Secondary | ICD-10-CM | POA: Diagnosis not present

## 2018-02-28 MED FILL — MELOXICAM 15 MG TABLET: 15 | 30 days supply | Qty: 30 | Fill #0

## 2018-02-28 MED FILL — traZODone HCL 50 MG TABS: 50 | 30 days supply | Qty: 30 | Fill #2

## 2018-02-28 NOTE — Therapy (Signed)
[place] [address]  [place], [place], [address] Phone: [phone]   Fax:  [phone]  Physical Therapy Treatment  Patient Details  Name: Larry Davis MRN: [id number] Date of Birth: [birth year] Referring Provider (PT): Justice Britain, MD   Encounter Date: 02/28/2018  PT End of Session - 02/28/18 0857    Visit Number  4    Number of Visits  16    Date for PT Re-Evaluation  04/11/18    Authorization Type  [facility]    PT Start Time  0847    PT Stop Time  0936    PT Time Calculation (min)  49 min    Activity Tolerance  Patient tolerated treatment well    Behavior During Therapy  [facility] for tasks assessed/performed       Past Medical History:  Diagnosis Date  . Allergy   . 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 Diabetes mellitus without complication ([place])   . PONV (postoperative nausea and vomiting)   . Prostate cancer ([place]) 03/2014  . Urticaria     Past Surgical History:  Procedure Laterality Date  . ADENOIDECTOMY    . CHOLECYSTECTOMY  2000  . COLONOSCOPY    . LYMPHADENECTOMY Bilateral 06/14/2014   Procedure: PELVIC LYMPH NODE DISSECTION;  Surgeon: Alexis Frock, MD;  [facility];  Service: Urology;  Laterality: Bilateral;  . REVERSE SHOULDER ARTHROPLASTY Left 01/19/2018  . REVERSE SHOULDER ARTHROPLASTY Left 01/19/2018   Procedure: REVERSE SHOULDER ARTHROPLASTY;  Surgeon: Justice Britain, MD;  Location: [place];  Service: Orthopedics;  Laterality: Left;  135min  Please move 7:30am [place] to 10am  . ROBOT ASSISTED LAPAROSCOPIC RADICAL PROSTATECTOMY N/A 06/14/2014   Procedure: ROBOTIC ASSISTED LAPAROSCOPIC RADICAL PROSTATECTOMY WITH INDOCYANINE GREEN DYE INJECTION;  Surgeon: Alexis Frock, MD;  [facility];  Service: Urology;  Laterality: N/A;  . SHOULDER SURGERY Right 1990   arthroscopy  . TONSILLECTOMY      There were no vitals filed for this visit.  Subjective Assessment - 02/28/18 0851     Subjective  Pt. noting feeling mostly stiffness now.      Pertinent History  L reverse TSA 01/19/18    Patient Stated Goals  "improve flexibility & ROM"    Currently in Pain?  Yes    Pain Score  4     Pain Location  Shoulder    Pain Orientation  Left    Pain Descriptors / Indicators  --   "Stiffness"                      [facility] Adult PT Treatment/Exercise - 02/28/18 0911      Shoulder Exercises: Supine   Protraction  Left;15 reps;AROM    Protraction Limitations  Cues for pacing required     Flexion  Left;15 reps;AAROM    Flexion Limitations  wand; cues for 3" hold     ABduction  Left;10 reps;AAROM    ABduction Limitations  scaption; rolling orange p- ball on table seated on chair     Other Supine Exercises  L shoulder small CW/CCW circles x 15 each    Other Supine Exercises  Seated L shoulder flexion AAROM with red p-ball rollout to tolerance x 10 resp       Shoulder Exercises: Prone   Retraction  Left;AROM;15 reps   Cues required for hold at top and scapular retraction    Retraction Limitations  + row to neutral  Extension  Left;AROM;15 reps   Cues required for hold at top and scapular retraction    Extension Limitations  + scap retraction to neutral      Shoulder Exercises: Pulleys   Flexion  3 minutes    Scaption  3 minutes      Shoulder Exercises: ROM/Strengthening   Wall Wash  L flexion wall walk x 5 reps; minor cueing required to avoid excessive scapular elevation       Vasopneumatic   Number Minutes Vasopneumatic   10 minutes    Vasopnuematic Location   Shoulder    Vasopneumatic Pressure  Low    Vasopneumatic Temperature   coldest      Manual Therapy   Manual Therapy  Passive ROM;Soft tissue mobilization    Manual therapy comments  supine    Soft tissue mobilization  L pec, deltoid complex - somewhat tender in L pec     Passive ROM  L shoulder PROM to tolerance                PT Short Term Goals - 02/21/18 0853      PT SHORT TERM  GOAL #1   Title  Independent with initial HEP    Status  On-going      PT SHORT TERM GOAL #2   Title  L shoulder ER >/= 45 dg    Status  On-going        PT Long Term Goals - 02/21/18 4403      PT LONG TERM GOAL #1   Title  Independent with ongoing HEP    Status  On-going      PT LONG TERM GOAL #2   Title  L shoulder flexion & abduction AROM WFL w/o increased pain    Status  On-going      PT LONG TERM GOAL #3   Title  L shoulder strength grossly >/= 4/5 for improved functional use of L UE    Status  On-going      PT LONG TERM GOAL #4   Title  Pt will report ability to use L UE for basic ADLs and light household chores w/o increased pain    Status  On-going            Plan - 02/28/18 0858    Clinical Impression Statement  [name] reporting L shoulder stiffness this morning which subsided as treatment progressed today.  Tolerated all gentle AAROM and AROM activities well in session today.  Manual therapy addressing mild tenderness in L pec musculature with some relief noted.  Flexion wall slides reviewed with pt. today as he reports he is "already doing these at home".  Pt. cautioned not to perform more than 5 reps of this activity at a time at home to avoid overexertion of L shoulder musculature and avoid excessive scapular elevation with this activity.  Ended visit with ice/compression to L shoulder to reduce post-exercise soreness and swelling.  Progressing per protocol.    Rehab Potential  Good    PT Frequency  2x / week   most likely tapering to 1x/wk after 4-6 weeks    PT Duration  8 weeks    PT Treatment/Interventions  Patient/family education;Neuromuscular re-education;Therapeutic exercise;Therapeutic activities;Manual techniques;Passive range of motion;Electrical Stimulation;Moist Heat;Cryotherapy;Vasopneumatic Device;Dry needling;Iontophoresis 4mg /ml Dexamethasone;ADLs/Self Care Home Management    PT Next Visit Plan  L reverse TSA protocol    Consulted and Agree with  Plan of Care  Patient       Patient will  benefit from skilled therapeutic intervention in order to improve the following deficits and impairments:  Pain, Decreased range of motion, Decreased strength, Impaired UE functional use, Impaired flexibility, Increased muscle spasms, Decreased activity tolerance  Visit Diagnosis: Chronic left shoulder pain  Stiffness of left shoulder, not elsewhere classified  Muscle weakness (generalized)  Abnormal posture     Problem List Patient Active Problem List   Diagnosis Date Noted  . 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 Rotator cuff tear arthropathy 05/30/2017  . Abnormal electrocardiogram (ECG) (EKG) 03/14/2017  . Overweight (BMI 25.0-29.9) 09/23/2016  . 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 Shoulder joint pain 08/15/2015  . Hyperlipidemia 03/18/2015  . Prostate cancer ([place]) 05/12/2014  . Perennial allergic rhinitis 05/12/2014  . Insomnia 05/12/2014  . Type 2 diabetes mellitus with hyperglycemia, without long-term current use of insulin [facility]) 05/11/2014    Bess Harvest, PTA 02/28/18 9:53 AM   [facility] [address]  [place], [place], [address] Phone: [phone]   Fax:  [phone]  Name: KARINA LENDERMAN MRN: [id number] Date of Birth: Nov 25, 1955

## 2018-03-02 ENCOUNTER — Ambulatory Visit: Payer: 59 | Admitting: Physical Therapy

## 2018-03-02 ENCOUNTER — Encounter: Payer: Self-pay | Admitting: Physical Therapy

## 2018-03-02 DIAGNOSIS — M6281 Muscle weakness (generalized): Secondary | ICD-10-CM

## 2018-03-02 DIAGNOSIS — G8929 Other chronic pain: Secondary | ICD-10-CM

## 2018-03-02 DIAGNOSIS — M25612 Stiffness of left shoulder, not elsewhere classified: Secondary | ICD-10-CM

## 2018-03-02 DIAGNOSIS — R293 Abnormal posture: Secondary | ICD-10-CM

## 2018-03-02 DIAGNOSIS — M25512 Pain in left shoulder: Principal | ICD-10-CM

## 2018-03-02 MED FILL — CYCLOBENZAPRINE HCL 10 MG T: 10 | 10 days supply | Qty: 30 | Fill #1

## 2018-03-02 NOTE — Therapy (Signed)
[facility] [address]  [place], [place], [address] Phone: [phone]   Fax:  [phone]  Physical Therapy Treatment  Patient Details  Name: Larry [place] MRN: [id number] Date of Birth: 1955-04-19 Referring Provider (PT): Justice Britain, MD   Encounter Date: 03/02/2018  PT End of Session - 03/02/18 0930    Visit Number  5    Number of Visits  16    Date for PT Re-Evaluation  04/11/18    Authorization Type  [facility]    PT Start Time  0930    PT Stop Time  1011    PT Time Calculation (min)  41 min    Activity Tolerance  Patient tolerated treatment well    Behavior During Therapy  [facility] for tasks assessed/performed       Past Medical History:  Diagnosis Date  . Allergy   . 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 Diabetes mellitus without complication ([place])   . PONV (postoperative nausea and vomiting)   . Prostate cancer ([place]) 03/2014  . Urticaria     Past Surgical History:  Procedure Laterality Date  . ADENOIDECTOMY    . CHOLECYSTECTOMY  2000  . COLONOSCOPY    . LYMPHADENECTOMY Bilateral 06/14/2014   Procedure: PELVIC LYMPH NODE DISSECTION;  Surgeon: Alexis Frock, MD;  [facility];  Service: Urology;  Laterality: Bilateral;  . REVERSE SHOULDER ARTHROPLASTY Left 01/19/2018  . REVERSE SHOULDER ARTHROPLASTY Left 01/19/2018   Procedure: REVERSE SHOULDER ARTHROPLASTY;  Surgeon: Justice Britain, MD;  Location: [place];  Service: Orthopedics;  Laterality: Left;  19min  Please move 7:30am [place] to 10am  . ROBOT ASSISTED LAPAROSCOPIC RADICAL PROSTATECTOMY N/A 06/14/2014   Procedure: ROBOTIC ASSISTED LAPAROSCOPIC RADICAL PROSTATECTOMY WITH INDOCYANINE GREEN DYE INJECTION;  Surgeon: Alexis Frock, MD;  [facility];  Service: Urology;  Laterality: N/A;  . SHOULDER SURGERY Right 1990   arthroscopy  . TONSILLECTOMY      There were no vitals filed for this visit.  Subjective Assessment - 03/02/18 0933     Subjective  Pt reports he did something in his sleep last night and the back of his L shoulder is more sore and tight to day.    Pertinent History  L reverse TSA 01/19/18    Patient Stated Goals  "improve flexibility & ROM"    Currently in Pain?  Yes    Pain Score  4     Pain Location  Shoulder    Pain Orientation  Left    Pain Descriptors / Indicators  Sore;Tightness                       [facility] Adult PT Treatment/Exercise - 03/02/18 0930      Exercises   Exercises  Shoulder      Shoulder Exercises: Supine   External Rotation  Left;10 reps;AROM    External Rotation Limitations  neutral to 45 dg; arm supported on pillow at ~45 dg abduction    Internal Rotation  Left;10 reps;AROM    Internal Rotation Limitations  neutral to tolerance; arm supported on pillow at ~45 dg abduction    Flexion  Left;15 reps;AROM    Flexion Limitations  cues for scapular retraction & depression    Other Supine Exercises  L shoulder short arc IR/ER crossing neutral x 10 working transitonal control; arm supported on PT's knee at ~45 dg abduction with PT blocking motion at ~30 dg IR/ER  Shoulder Exercises: Sidelying   ABduction  Left;15 reps;AROM    ABduction Limitations  20 - 100 dg in scapular plane      Vasopneumatic   Number Minutes Vasopneumatic   --   pt deferred vaso today due to prior committment     Manual Therapy   Manual Therapy  Soft tissue mobilization;Myofascial release;Scapular mobilization    Manual therapy comments  supine    Soft tissue mobilization  L pecs, mid/post deltoid, biceps & posterior shoulder complex    Myofascial Release  manual TPR to teres group, subscapularis & lats    Scapular Mobilization  L scapula medial glide, retraction/depression & med/lat rotation               PT Short Term Goals - 02/21/18 1610      PT SHORT TERM GOAL #1   Title  Independent with initial HEP    Status  On-going      PT SHORT TERM GOAL #2   Title  L shoulder  ER >/= 45 dg    Status  On-going        PT Long Term Goals - 02/21/18 9604      PT LONG TERM GOAL #1   Title  Independent with ongoing HEP    Status  On-going      PT LONG TERM GOAL #2   Title  L shoulder flexion & abduction AROM WFL w/o increased pain    Status  On-going      PT LONG TERM GOAL #3   Title  L shoulder strength grossly >/= 4/5 for improved functional use of L UE    Status  On-going      PT LONG TERM GOAL #4   Title  Pt will report ability to use L UE for basic ADLs and light household chores w/o increased pain    Status  On-going            Plan - 03/02/18 1011    Clinical Impression Statement  Altariq noting increased posterior shoulder soreness/discomfort after "doing something in my sleep last night" - increased muscle tension with ttp over posterior sholder complex which responsed well to manual STM/MFR/TPR with pt noting good relief and better comfort with AROM exercises although still demonstrating limited control with IR/ER in supine.     Rehab Potential  Good    PT Frequency  2x / week   most likely tapering to 1x/wk after 4-6 weeks    PT Duration  8 weeks    PT Treatment/Interventions  Patient/family education;Neuromuscular re-education;Therapeutic exercise;Therapeutic activities;Manual techniques;Passive range of motion;Electrical Stimulation;Moist Heat;Cryotherapy;Vasopneumatic Device;Dry needling;Iontophoresis 4mg /ml Dexamethasone;ADLs/Self Care Home Management    PT Next Visit Plan  L reverse TSA protocol    Consulted and Agree with Plan of Care  Patient       Patient will benefit from skilled therapeutic intervention in order to improve the following deficits and impairments:  Pain, Decreased range of motion, Decreased strength, Impaired UE functional use, Impaired flexibility, Increased muscle spasms, Decreased activity tolerance  Visit Diagnosis: Chronic left shoulder pain  Stiffness of left shoulder, not elsewhere classified  Muscle  weakness (generalized)  Abnormal posture     Problem List Patient Active Problem List   Diagnosis Date Noted  . 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 Rotator cuff tear arthropathy 05/30/2017  . Abnormal electrocardiogram (ECG) (EKG) 03/14/2017  . Overweight (BMI 25.0-29.9) 09/23/2016  . 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 Shoulder joint pain 08/15/2015  . Hyperlipidemia 03/18/2015  . Prostate cancer ([place]) 05/12/2014  . Perennial allergic rhinitis 05/12/2014  . Insomnia 05/12/2014  . Type 2 diabetes mellitus with hyperglycemia, without long-term current use of insulin ([place]) 05/11/2014    Percival Spanish, PT, MPT 03/02/2018, 12:54 PM  [facility] [address]  [place], [place], [address] Phone: [phone]   Fax:  [phone]  Name: AARRON WIERZBICKI MRN: [id number] Date of Birth: July 20, 1955

## 2018-03-03 MED FILL — traMADol HCL 50 MG TABS: 50 | 30 days supply | Qty: 30 | Fill #0

## 2018-03-03 MED FILL — DICLOFENAC SODIUM 1 % GEL: 1 | 83 days supply | Qty: 500 | Fill #0

## 2018-03-07 ENCOUNTER — Ambulatory Visit: Payer: 59

## 2018-03-07 DIAGNOSIS — R293 Abnormal posture: Secondary | ICD-10-CM | POA: Diagnosis not present

## 2018-03-07 DIAGNOSIS — M25512 Pain in left shoulder: Secondary | ICD-10-CM | POA: Diagnosis not present

## 2018-03-07 DIAGNOSIS — M6281 Muscle weakness (generalized): Secondary | ICD-10-CM | POA: Diagnosis not present

## 2018-03-07 DIAGNOSIS — G8929 Other chronic pain: Secondary | ICD-10-CM | POA: Diagnosis not present

## 2018-03-07 DIAGNOSIS — M25612 Stiffness of left shoulder, not elsewhere classified: Secondary | ICD-10-CM | POA: Diagnosis not present

## 2018-03-07 NOTE — Therapy (Signed)
[facility] [address]  [place], [place], [address] Phone: [phone]   Fax:  [phone]  Physical Therapy Treatment  Patient Details  Name: Larry Davis MRN: [id number] Date of Birth: March 27, 1955 Referring Provider (PT): Justice Britain, MD   Encounter Date: 03/07/2018  PT End of Session - 03/07/18 0851    Visit Number  6    Number of Visits  16    Date for PT Re-Evaluation  04/11/18    Authorization Type  [facility]    PT Start Time  0845    PT Stop Time  0940    PT Time Calculation (min)  55 min    Activity Tolerance  Patient tolerated treatment well    Behavior During Therapy  [facility] for tasks assessed/performed       Past Medical History:  Diagnosis Date  . Allergy   . 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 Diabetes mellitus without complication ([place])   . PONV (postoperative nausea and vomiting)   . Prostate cancer ([place]) 03/2014  . Urticaria     Past Surgical History:  Procedure Laterality Date  . ADENOIDECTOMY    . CHOLECYSTECTOMY  2000  . COLONOSCOPY    . LYMPHADENECTOMY Bilateral 06/14/2014   Procedure: PELVIC LYMPH NODE DISSECTION;  Surgeon: Alexis Frock, MD;  [facility];  Service: Urology;  Laterality: Bilateral;  . REVERSE SHOULDER ARTHROPLASTY Left 01/19/2018  . REVERSE SHOULDER ARTHROPLASTY Left 01/19/2018   Procedure: REVERSE SHOULDER ARTHROPLASTY;  Surgeon: Justice Britain, MD;  Location: [place];  Service: Orthopedics;  Laterality: Left;  154min  Please move 7:30am [place] to 10am  . ROBOT ASSISTED LAPAROSCOPIC RADICAL PROSTATECTOMY N/A 06/14/2014   Procedure: ROBOTIC ASSISTED LAPAROSCOPIC RADICAL PROSTATECTOMY WITH INDOCYANINE GREEN DYE INJECTION;  Surgeon: Alexis Frock, MD;  [facility];  Service: Urology;  Laterality: N/A;  . SHOULDER SURGERY Right 1990   arthroscopy  . TONSILLECTOMY      There were no vitals filed for this visit.  Subjective Assessment - 03/07/18 0847     Subjective  Pt. reporting L shoulder felt, "swollen and hot" after sleeping on it wrong last night however this subsided as morning has progressed.      Pertinent History  L reverse TSA 01/19/18    Patient Stated Goals  "improve flexibility & ROM"    Currently in Pain?  Yes    Pain Score  3     Pain Location  Shoulder    Pain Orientation  Left    Pain Descriptors / Indicators  Sore    Pain Type  Surgical pain;Acute pain    Pain Frequency  Constant    Aggravating Factors   overhead lifting, reaching behind back     Pain Relieving Factors  pain meds     Multiple Pain Sites  No                       [facility] Adult PT Treatment/Exercise - 03/07/18 0858      Shoulder Exercises: Supine   Protraction  Left;20 reps;AROM    External Rotation  Left;AROM;20 reps    External Rotation Limitations  neutral to 45 dg; arm supported on pillow at ~45 dg abduction    Internal Rotation  Left;20 reps;AROM    Internal Rotation Limitations  neutral to tolerance; arm supported on pillow at ~45 dg abduction    Flexion  Left;20 reps;AROM    Flexion Limitations  cues for  scapular retraction & depression    Other Supine Exercises  L shoulder small CW/CCW 1# circles x 15 each    Other Supine Exercises  L shoulder ABC's 1# x 1 reps       Shoulder Exercises: Sidelying   ABduction  Left;20 reps    ABduction Limitations  20 - 100 dg in scapular plane      Vasopneumatic   Number Minutes Vasopneumatic   15 minutes    Vasopnuematic Location   Shoulder    Vasopneumatic Pressure  Low    Vasopneumatic Temperature   coldest      Manual Therapy   Manual Therapy  Soft tissue mobilization;Myofascial release    Manual therapy comments  supine    Soft tissue mobilization  Mid/post deltoid, posterior shoulder complex, L UT    Myofascial Release  TPR to L UT, L posterior/inferior shoulder                PT Short Term Goals - 03/07/18 1005      PT SHORT TERM GOAL #1   Title  Independent with  initial HEP    Status  Achieved      PT SHORT TERM GOAL #2   Title  L shoulder ER >/= 45 dg    Status  On-going        PT Long Term Goals - 02/21/18 1093      PT LONG TERM GOAL #1   Title  Independent with ongoing HEP    Status  On-going      PT LONG TERM GOAL #2   Title  L shoulder flexion & abduction AROM WFL w/o increased pain    Status  On-going      PT LONG TERM GOAL #3   Title  L shoulder strength grossly >/= 4/5 for improved functional use of L UE    Status  On-going      PT LONG TERM GOAL #4   Title  Pt will report ability to use L UE for basic ADLs and light household chores w/o increased pain    Status  On-going            Plan - 03/07/18 1003    Clinical Impression Statement  Pt. reporting L shoulder was "swollen and hot" this morning upon waking which he attributes to sleeping on L shoulder "wrong" last night in bed.  Noted L shoulder comfort improved as morning has progressed today.  Manual therapy addressing tenderness in posterior/inferior shoulder and UT musculature with good relief.  Tolerated mild progression in AROM activities well today reporting L shoulder muscular fatigue following session.  Ended visit with ice/compression to L shoulder to reduce post-exercise soreness.  Progressing well per protocol.      Rehab Potential  Good    PT Frequency  --    PT Treatment/Interventions  Patient/family education;Neuromuscular re-education;Therapeutic exercise;Therapeutic activities;Manual techniques;Passive range of motion;Electrical Stimulation;Moist Heat;Cryotherapy;Vasopneumatic Device;Dry needling;Iontophoresis 4mg /ml Dexamethasone;ADLs/Self Care Home Management    PT Next Visit Plan  L reverse TSA protocol    Consulted and Agree with Plan of Care  Patient       Patient will benefit from skilled therapeutic intervention in order to improve the following deficits and impairments:  Pain, Decreased range of motion, Decreased strength, Impaired UE functional  use, Impaired flexibility, Increased muscle spasms, Decreased activity tolerance  Visit Diagnosis: Chronic left shoulder pain  Stiffness of left shoulder, not elsewhere classified  Muscle weakness (generalized)  Abnormal posture  Problem List Patient Active Problem List   Diagnosis Date Noted  . 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 Rotator cuff tear arthropathy 05/30/2017  . Abnormal electrocardiogram (ECG) (EKG) 03/14/2017  . Overweight (BMI 25.0-29.9) 09/23/2016  . 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 Shoulder joint pain 08/15/2015  . Hyperlipidemia 03/18/2015  . Prostate cancer ([place]) 05/12/2014  . Perennial allergic rhinitis 05/12/2014  . Insomnia 05/12/2014  . Type 2 diabetes mellitus with hyperglycemia, without long-term current use of insulin ([place]) 05/11/2014    Larry Davis, Larry Davis 03/07/18 10:09 AM   [facility] [address]  [place], [place], [address] Phone: [phone]   Fax:  [phone]  Name: Larry Davis MRN: [id number] Date of Birth: 17-Aug-1955

## 2018-03-09 ENCOUNTER — Encounter: Payer: Self-pay | Admitting: Physical Therapy

## 2018-03-09 ENCOUNTER — Ambulatory Visit: Payer: 59 | Admitting: Physical Therapy

## 2018-03-09 DIAGNOSIS — M25512 Pain in left shoulder: Secondary | ICD-10-CM | POA: Diagnosis not present

## 2018-03-09 DIAGNOSIS — G8929 Other chronic pain: Secondary | ICD-10-CM | POA: Diagnosis not present

## 2018-03-09 DIAGNOSIS — M25612 Stiffness of left shoulder, not elsewhere classified: Secondary | ICD-10-CM

## 2018-03-09 DIAGNOSIS — M6281 Muscle weakness (generalized): Secondary | ICD-10-CM | POA: Diagnosis not present

## 2018-03-09 DIAGNOSIS — R293 Abnormal posture: Secondary | ICD-10-CM | POA: Diagnosis not present

## 2018-03-09 NOTE — Therapy (Signed)
[facility] [address]  [place], [place], [address] Phone: [phone]   Fax:  [phone]  Physical Therapy Treatment  Patient Details  Name: Larry Davis MRN: [id number] Date of Birth: [birth year] Referring Provider (PT): Justice Britain, MD   Encounter Date: 03/09/2018  PT End of Session - 03/09/18 0931    Visit Number  7    Number of Visits  16    Date for PT Re-Evaluation  04/11/18    Authorization Type  [facility]    PT Start Time  0931    PT Stop Time  1028    PT Time Calculation (min)  57 min    Activity Tolerance  Patient tolerated treatment well    Behavior During Therapy  [facility] for tasks assessed/performed       Past Medical History:  Diagnosis Date  . Allergy   . 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 Diabetes mellitus without complication ([place])   . PONV (postoperative nausea and vomiting)   . Prostate cancer ([place]) 03/2014  . Urticaria     Past Surgical History:  Procedure Laterality Date  . ADENOIDECTOMY    . CHOLECYSTECTOMY  2000  . COLONOSCOPY    . LYMPHADENECTOMY Bilateral 06/14/2014   Procedure: PELVIC LYMPH NODE DISSECTION;  Surgeon: Alexis Frock, MD;  [facility];  Service: Urology;  Laterality: Bilateral;  . REVERSE SHOULDER ARTHROPLASTY Left 01/19/2018  . REVERSE SHOULDER ARTHROPLASTY Left 01/19/2018   Procedure: REVERSE SHOULDER ARTHROPLASTY;  Surgeon: Justice Britain, MD;  Location: [place];  Service: Orthopedics;  Laterality: Left;  160min  Please move 7:30am [place] to 10am  . ROBOT ASSISTED LAPAROSCOPIC RADICAL PROSTATECTOMY N/A 06/14/2014   Procedure: ROBOTIC ASSISTED LAPAROSCOPIC RADICAL PROSTATECTOMY WITH INDOCYANINE GREEN DYE INJECTION;  Surgeon: Alexis Frock, MD;  [facility];  Service: Urology;  Laterality: N/A;  . SHOULDER SURGERY Right 1990   arthroscopy  . TONSILLECTOMY      There were no vitals filed for this visit.  Subjective Assessment - 03/09/18 0936     Subjective  Pt reporting L shoulder feeling much better than last time. Reports he is down to just using Tylenol, meloxicam and Voltaren with pain well managed.    Pertinent History  L reverse TSA 01/19/18    Patient Stated Goals  "improve flexibility & ROM"    Currently in Pain?  No/denies                       [facility] Adult PT Treatment/Exercise - 03/09/18 0931      Exercises   Exercises  Shoulder      Shoulder Exercises: Supine   Protraction  Left;20 reps;Weights;Strengthening    Protraction Weight (lbs)  2    External Rotation  Left;20 reps;AAROM    External Rotation Limitations  short range 30 dg IR to 30 dg ER    Internal Rotation  Left;20 reps;AROM    Internal Rotation Limitations  short range 30 dg IR to 30 dg ER    Flexion  Left;15 reps;Weights;Strengthening    Shoulder Flexion Weight (lbs)  1    Flexion Limitations  pt with better awareness of scapular retraction & depression    Other Supine Exercises  L shoulder small CW/CCW circles at 90 dg flexion 2# x 12 each      Shoulder Exercises: Sidelying   ABduction  Left;15 reps;Weights;Strengthening    ABduction Weight (lbs)  1    ABduction Limitations  20 - 100 dg in scapular plane    Other Sidelying Exercises  L shoulder small CW/CCW circles at 90 dg scaption/abduction 10 each      Shoulder Exercises: ROM/Strengthening   Rhythmic Stabilization, Supine  L shoulder - 90 dg flexion 2 x 30 sec; IR/ER in scapular plane at ~45 dg abduction 2 x 30 sec      Shoulder Exercises: Isometric Strengthening   External Rotation  5X5"   2 sets   External Rotation Limitations  into towel on edge of doorframe - cues to step slightly away from doorframe for more neurtral ER alignment & for scapular engagement    ABduction  5X5"    ABduction Limitations  into towel on wall      Modalities   Modalities  Vasopneumatic      Vasopneumatic   Number Minutes Vasopneumatic   10 minutes    Vasopnuematic Location   Shoulder     Vasopneumatic Pressure  Low    Vasopneumatic Temperature   coldest      Manual Therapy   Manual Therapy  Soft tissue mobilization;Myofascial release    Soft tissue mobilization  L ant deltoid & biceps    Myofascial Release  manual TPR to biceps & anterior deltoid             PT Education - 03/09/18 1025    Education Details  HEP - hand tendon glides    Person(s) Educated  Patient    Methods  Explanation;Demonstration;Handout    Comprehension  Verbalized understanding       PT Short Term Goals - 03/07/18 1005      PT SHORT TERM GOAL #1   Title  Independent with initial HEP    Status  Achieved      PT SHORT TERM GOAL #2   Title  L shoulder ER >/= 45 dg    Status  On-going        PT Long Term Goals - 02/21/18 0853      PT LONG TERM GOAL #1   Title  Independent with ongoing HEP    Status  On-going      PT LONG TERM GOAL #2   Title  L shoulder flexion & abduction AROM WFL w/o increased pain    Status  On-going      PT LONG TERM GOAL #3   Title  L shoulder strength grossly >/= 4/5 for improved functional use of L UE    Status  On-going      PT LONG TERM GOAL #4   Title  Pt will report ability to use L UE for basic ADLs and light household chores w/o increased pain    Status  On-going            Plan - 03/09/18 0939    Clinical Impression Statement  Larry Davis reporting pain/swelling noted at last session is much better today but still with a tender spot in anterior deltoid/proximal biceps region upon palpation - improved after manual STM/MFR/TPR. Pt demonstrating improving control of AROM in supine, therefore added light weights with pt noting more "jumpy" feel during motion initially with quicker fatigue. Will plan to attempt to advance AA/AROM to more upright activities in upcoming visits. Pt noting stiffness/locking in fingers upon waking in the morning recently, therefore provided HEP instruction in hand tendon glides.    Rehab Potential  Good    PT  Treatment/Interventions  Patient/family education;Neuromuscular re-education;Therapeutic exercise;Therapeutic activities;Manual techniques;Passive range of motion;Electrical Stimulation;Moist Heat;Cryotherapy;Vasopneumatic Device;Dry  needling;Iontophoresis 4mg /ml Dexamethasone;ADLs/Self Care Home Management    PT Next Visit Plan  L reverse TSA protocol    Consulted and Agree with Plan of Care  Patient       Patient will benefit from skilled therapeutic intervention in order to improve the following deficits and impairments:  Pain, Decreased range of motion, Decreased strength, Impaired UE functional use, Impaired flexibility, Increased muscle spasms, Decreased activity tolerance  Visit Diagnosis: Chronic left shoulder pain  Stiffness of left shoulder, not elsewhere classified  Muscle weakness (generalized)  Abnormal posture     Problem List Patient Active Problem List   Diagnosis Date Noted  . 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 Rotator cuff tear arthropathy 05/30/2017  . Abnormal electrocardiogram (ECG) (EKG) 03/14/2017  . Overweight (BMI 25.0-29.9) 09/23/2016  . 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 Shoulder joint pain 08/15/2015  . Hyperlipidemia 03/18/2015  . Prostate cancer ([place]) 05/12/2014  . Perennial allergic rhinitis 05/12/2014  . Insomnia 05/12/2014  . Type 2 diabetes mellitus with hyperglycemia, without long-term current use of insulin ([place]) 05/11/2014    Percival Spanish, PT, MPT 03/09/2018, 10:44 AM  [facility] [address]  [place], [place], [address] Phone: [phone]   Fax:  [phone]  Name: Larry Davis MRN: [id number] Date of Birth: 01-04-1956

## 2018-03-14 ENCOUNTER — Ambulatory Visit: Payer: 59

## 2018-03-14 DIAGNOSIS — M6281 Muscle weakness (generalized): Secondary | ICD-10-CM

## 2018-03-14 DIAGNOSIS — M25512 Pain in left shoulder: Secondary | ICD-10-CM | POA: Diagnosis not present

## 2018-03-14 DIAGNOSIS — R293 Abnormal posture: Secondary | ICD-10-CM | POA: Diagnosis not present

## 2018-03-14 DIAGNOSIS — G8929 Other chronic pain: Secondary | ICD-10-CM | POA: Diagnosis not present

## 2018-03-14 DIAGNOSIS — M25612 Stiffness of left shoulder, not elsewhere classified: Secondary | ICD-10-CM | POA: Diagnosis not present

## 2018-03-14 NOTE — Therapy (Signed)
[facility] [address]  [place], [place], [address] Phone: [phone]   Fax:  [phone]  Physical Therapy Treatment  Patient Details  Name: Larry Davis MRN: [id number] Date of Birth: 1955-07-11 Referring Provider (PT): Justice Britain, MD   Encounter Date: 03/14/2018  PT End of Session - 03/14/18 0852    Visit Number  8    Number of Visits  16    Date for PT Re-Evaluation  04/11/18    Authorization Type  [facility]    PT Start Time  0847    PT Stop Time  0926    PT Time Calculation (min)  39 min    Activity Tolerance  Patient tolerated treatment well    Behavior During Therapy  [facility] for tasks assessed/performed       Past Medical History:  Diagnosis Date  . Allergy   . 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 Diabetes mellitus without complication ([place])   . PONV (postoperative nausea and vomiting)   . Prostate cancer ([place]) 03/2014  . Urticaria     Past Surgical History:  Procedure Laterality Date  . ADENOIDECTOMY    . CHOLECYSTECTOMY  2000  . COLONOSCOPY    . LYMPHADENECTOMY Bilateral 06/14/2014   Procedure: PELVIC LYMPH NODE DISSECTION;  Surgeon: Alexis Frock, MD;  [facility];  Service: Urology;  Laterality: Bilateral;  . REVERSE SHOULDER ARTHROPLASTY Left 01/19/2018  . REVERSE SHOULDER ARTHROPLASTY Left 01/19/2018   Procedure: REVERSE SHOULDER ARTHROPLASTY;  Surgeon: Justice Britain, MD;  Location: [place];  Service: Orthopedics;  Laterality: Left;  182min  Please move 7:30am [place] to 10am  . ROBOT ASSISTED LAPAROSCOPIC RADICAL PROSTATECTOMY N/A 06/14/2014   Procedure: ROBOTIC ASSISTED LAPAROSCOPIC RADICAL PROSTATECTOMY WITH INDOCYANINE GREEN DYE INJECTION;  Surgeon: Alexis Frock, MD;  [facility];  Service: Urology;  Laterality: N/A;  . SHOULDER SURGERY Right 1990   arthroscopy  . TONSILLECTOMY      There were no vitals filed for this visit.  Subjective Assessment - 03/14/18 0850     Subjective  Pt. reporting shoulder has been feeling better last few days.      Pertinent History  L reverse TSA 01/19/18    Patient Stated Goals  "improve flexibility & ROM"    Currently in Pain?  Yes    Pain Score  1     Pain Location  Shoulder    Pain Orientation  Left    Pain Descriptors / Indicators  Sore    Pain Type  Surgical pain;Acute pain    Pain Onset  More than a month ago    Pain Frequency  Constant    Aggravating Factors   lifting     Multiple Pain Sites  No         [facility] PT Assessment - 03/14/18 0001      PROM   PROM Assessment Site  Shoulder    Right/Left Shoulder  Left    Left Shoulder Flexion  137 Degrees    Left Shoulder ABduction  123 Degrees    Left Shoulder Internal Rotation  64 Degrees    Left Shoulder External Rotation  47 Degrees                   [facility] Adult PT Treatment/Exercise - 03/14/18 0856      Shoulder Exercises: Supine   Protraction  Left;20 reps;Weights;Strengthening    Protraction Weight (lbs)  2    External Rotation  Left;20  reps;AAROM    External Rotation Limitations  short range 30 dg IR to 30 dg ER    Internal Rotation  Left;20 reps;AROM    Internal Rotation Limitations  short range 30 dg IR to 30 dg ER    Other Supine Exercises  L shoulder small CW/CCW circles at 90 dg flexion 2# x 15 each      Shoulder Exercises: Seated   Flexion  Both;10 reps;AROM    Flexion Limitations  stopped at shoulder height as pt. with signficant L UT involvement above 90 dg       Shoulder Exercises: Prone   Retraction  Left;AROM;15 reps    Retraction Weight (lbs)  2    Extension  Left;AROM;15 reps;Weights    Extension Weight (lbs)  2      Shoulder Exercises: Standing   Flexion  Left;10 reps;AAROM   Cues required to reduce excessive scapular elevation    Flexion Limitations  orange p-ball on wall     ABduction  Left;10 reps;AAROM    ABduction Limitations  orange p-ball on wall    Cues required to reduce excessive scapular elevation       Shoulder Exercises: Pulleys   Scaption  3 minutes      Shoulder Exercises: Isometric Strengthening   External Rotation  --   5" x 10 reps    External Rotation Limitations  in neutral with slight ER positioning       Manual Therapy   Manual Therapy  Soft tissue mobilization;Myofascial release    Soft tissue mobilization  L ant deltoid                PT Short Term Goals - 03/14/18 0909      PT SHORT TERM GOAL #1   Title  Independent with initial HEP    Status  Achieved      PT SHORT TERM GOAL #2   Title  L shoulder ER >/= 45 dg    Status  Achieved        PT Long Term Goals - 02/21/18 4696      PT LONG TERM GOAL #1   Title  Independent with ongoing HEP    Status  On-going      PT LONG TERM GOAL #2   Title  L shoulder flexion & abduction AROM WFL w/o increased pain    Status  On-going      PT LONG TERM GOAL #3   Title  L shoulder strength grossly >/= 4/5 for improved functional use of L UE    Status  On-going      PT LONG TERM GOAL #4   Title  Pt will report ability to use L UE for basic ADLs and light household chores w/o increased pain    Status  On-going            Plan - 03/14/18 0911    Clinical Impression Statement  Pt. able to achieve 47 dg PROM ER today achieving STG #2.  Tolerated progression into additional elevation activities in today's session however noted fatigue following session.  Pt. still requiring cueing with elevation activities to prevent excessive scapular elevation.  Will monitor tolerance to today's progression and continue per protocol in coming sessions.      Rehab Potential  Good    PT Treatment/Interventions  Patient/family education;Neuromuscular re-education;Therapeutic exercise;Therapeutic activities;Manual techniques;Passive range of motion;Electrical Stimulation;Moist Heat;Cryotherapy;Vasopneumatic Device;Dry needling;Iontophoresis 4mg /ml Dexamethasone;ADLs/Self Care Home Management    PT Next Visit Plan  L reverse  TSA protocol    Consulted and Agree with Plan of Care  Patient       Patient will benefit from skilled therapeutic intervention in order to improve the following deficits and impairments:  Pain, Decreased range of motion, Decreased strength, Impaired UE functional use, Impaired flexibility, Increased muscle spasms, Decreased activity tolerance  Visit Diagnosis: Chronic left shoulder pain  Stiffness of left shoulder, not elsewhere classified  Muscle weakness (generalized)  Abnormal posture     Problem List Patient Active Problem List   Diagnosis Date Noted  . 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 Rotator cuff tear arthropathy 05/30/2017  . Abnormal electrocardiogram (ECG) (EKG) 03/14/2017  . Overweight (BMI 25.0-29.9) 09/23/2016  . 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 Shoulder joint pain 08/15/2015  . Hyperlipidemia 03/18/2015  . Prostate cancer ([place]) 05/12/2014  . Perennial allergic rhinitis 05/12/2014  . Insomnia 05/12/2014  . Type 2 diabetes mellitus with hyperglycemia, without long-term current use of insulin ([place]) 05/11/2014    Bess Harvest, PTA 03/14/18 1:01 PM    [facility] [address]  [place], [place], [address] Phone: [phone]   Fax:  [phone]  Name: Larry Davis MRN: [id number] Date of Birth: 1955-08-04

## 2018-03-16 ENCOUNTER — Ambulatory Visit: Payer: 59

## 2018-03-16 DIAGNOSIS — M6281 Muscle weakness (generalized): Secondary | ICD-10-CM | POA: Diagnosis not present

## 2018-03-16 DIAGNOSIS — M25612 Stiffness of left shoulder, not elsewhere classified: Secondary | ICD-10-CM

## 2018-03-16 DIAGNOSIS — G8929 Other chronic pain: Secondary | ICD-10-CM

## 2018-03-16 DIAGNOSIS — R293 Abnormal posture: Secondary | ICD-10-CM | POA: Diagnosis not present

## 2018-03-16 DIAGNOSIS — M25512 Pain in left shoulder: Principal | ICD-10-CM

## 2018-03-16 NOTE — Therapy (Signed)
[facility] [address]  [place], [place], [address] Phone: [phone]   Fax:  [phone]  Physical Therapy Treatment  Patient Details  Name: Larry Davis MRN: [id number] Date of Birth: Feb 04, 1956 Referring Provider (PT): Larry Britain, MD   Encounter Date: 03/16/2018  PT End of Session - 03/16/18 0849    Visit Number  9    Number of Visits  16    Date for PT Re-Evaluation  04/11/18    Authorization Type  [facility]    PT Start Time  4315    PT Stop Time  0925    PT Time Calculation (min)  38 min    Activity Tolerance  Patient tolerated treatment well    Behavior During Therapy  [facility] for tasks assessed/performed       Past Medical History:  Diagnosis Date  . Allergy   . 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 Diabetes mellitus without complication ([place])   . PONV (postoperative nausea and vomiting)   . Prostate cancer ([place]) 03/2014  . Urticaria     Past Surgical History:  Procedure Laterality Date  . ADENOIDECTOMY    . CHOLECYSTECTOMY  2000  . COLONOSCOPY    . LYMPHADENECTOMY Bilateral 06/14/2014   Procedure: PELVIC LYMPH NODE DISSECTION;  Surgeon: Alexis Frock, MD;  Location: [facility];  Service: Urology;  Laterality: Bilateral;  . REVERSE SHOULDER ARTHROPLASTY Left 01/19/2018  . REVERSE SHOULDER ARTHROPLASTY Left 01/19/2018   Procedure: REVERSE SHOULDER ARTHROPLASTY;  Surgeon: Larry Britain, MD;  Location: [place] [facility];  Service: Orthopedics;  Laterality: Left;  189min  Please move 7:30am [place] to 10am  . ROBOT ASSISTED LAPAROSCOPIC RADICAL PROSTATECTOMY N/A 06/14/2014   Procedure: ROBOTIC ASSISTED LAPAROSCOPIC RADICAL PROSTATECTOMY WITH INDOCYANINE GREEN DYE INJECTION;  Surgeon: Alexis Frock, MD;  Location[facility];  Service: Urology;  Laterality: N/A;  . SHOULDER SURGERY Right 1990   arthroscopy  . TONSILLECTOMY      There were no vitals filed for this visit.  Subjective Assessment - 03/16/18 0903     Subjective  Pt. reporting some muscular soreness after last session.      Pertinent History  L reverse TSA 01/19/18    Patient Stated Goals  "improve flexibility & ROM"    Currently in Pain?  Yes    Pain Score  2     Pain Location  Shoulder    Pain Orientation  Left    Pain Descriptors / Indicators  Sore   muscle soreness    Pain Type  Surgical pain;Acute pain    Pain Onset  More than a month ago    Pain Frequency  Constant    Multiple Pain Sites  No                       [facility] Adult PT Treatment/Exercise - 03/16/18 0914      Shoulder Exercises: Supine   Protraction  Left;20 reps;Weights;Strengthening    Protraction Weight (lbs)  2    External Rotation  Left;20 reps;AROM    External Rotation Limitations  short range 40 dg IR to 40 dg ER   Difficulty controlling at end ROM ER   Internal Rotation  Left;20 reps;AROM    Internal Rotation Limitations  short range 40 dg IR to 40 dg ER      Shoulder Exercises: Seated   External Rotation  Both;10 reps;AAROM    External Rotation Limitations  therapist manual assistance to attain  end range L ER + cueing for scapular retraction required       Shoulder Exercises: Sidelying   External Rotation  Left;10 reps;AAROM    External Rotation Limitations  therapist assistance for concentric; pt. controlling eccentric lowering     ABduction  Left;15 reps;Weights;Strengthening    ABduction Weight (lbs)  1    ABduction Limitations  20 - 110 dg in scapular plane    Other Sidelying Exercises  --    Other Sidelying Exercises  L ABC's in scapular plane with red med ball (1000Gr) x 1 round in sustained 90 dg scaption       Shoulder Exercises: Standing   Flexion  Left;10 reps;AAROM   Noted fatigue following    Flexion Limitations  orange p-ball on wall       Shoulder Exercises: Pulleys   Flexion  2 minutes    Scaption  2 minutes      Manual Therapy   Manual Therapy  Soft tissue mobilization;Myofascial release    Manual therapy  comments  supine     Soft tissue mobilization  STM/DTM to L anterior shoulder in area of tenderness     Myofascial Release  Manual TPR to L anterior shoulder                PT Short Term Goals - 03/14/18 0909      PT SHORT TERM GOAL #1   Title  Independent with initial HEP    Status  Achieved      PT SHORT TERM GOAL #2   Title  L shoulder ER >/= 45 dg    Status  Achieved        PT Long Term Goals - 02/21/18 3790      PT LONG TERM GOAL #1   Title  Independent with ongoing HEP    Status  On-going      PT LONG TERM GOAL #2   Title  L shoulder flexion & abduction AROM WFL w/o increased pain    Status  On-going      PT LONG TERM GOAL #3   Title  L shoulder strength grossly >/= 4/5 for improved functional use of L UE    Status  On-going      PT LONG TERM GOAL #4   Title  Pt will report ability to use L UE for basic ADLs and light household chores w/o increased pain    Status  On-going            Plan - 03/16/18 0904    Clinical Impression Statement  [name] reporting muscular soreness after last visit which extended to next day before subsiding.  Able to progress to sidelying ER today however requiring therapist assistance for concentric motion in order to avoid substitutions.  Reported L UE fatigue following wall ball flexion roll today and to end session.  Progressing well per protocol.      Rehab Potential  Good    PT Treatment/Interventions  Patient/family education;Neuromuscular re-education;Therapeutic exercise;Therapeutic activities;Manual techniques;Passive range of motion;Electrical Stimulation;Moist Heat;Cryotherapy;Vasopneumatic Device;Dry needling;Iontophoresis 4mg /ml Dexamethasone;ADLs/Self Care Home Management    PT Next Visit Plan  L reverse TSA protocol    Consulted and Agree with Plan of Care  Patient       Patient will benefit from skilled therapeutic intervention in order to improve the following deficits and impairments:  Pain, Decreased range  of motion, Decreased strength, Impaired UE functional use, Impaired flexibility, Increased muscle spasms, Decreased activity tolerance  Visit Diagnosis:  Chronic left shoulder pain  Stiffness of left shoulder, not elsewhere classified  Muscle weakness (generalized)  Abnormal posture     Problem List Patient Active Problem List   Diagnosis Date Noted  . 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 Rotator cuff tear arthropathy 05/30/2017  . Abnormal electrocardiogram (ECG) (EKG) 03/14/2017  . Overweight (BMI 25.0-29.9) 09/23/2016  . 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 Shoulder joint pain 08/15/2015  . Hyperlipidemia 03/18/2015  . Prostate cancer ([place]) 05/12/2014  . Perennial allergic rhinitis 05/12/2014  . Insomnia 05/12/2014  . Type 2 diabetes mellitus with hyperglycemia, without long-term current use of insulin ([place]) 05/11/2014    Bess Harvest, PTA 03/16/18 3:27 PM    [facility] [address]  [place], [place], [address] Phone: [phone]   Fax:  [phone]  Name: Larry Davis MRN: [id number] Date of Birth: [birth year]

## 2018-03-20 MED FILL — INVOKANA 300 MG TABLET: 300 | 30 days supply | Qty: 30 | Fill #2

## 2018-03-20 MED FILL — JANUVIA 100 MG TABLET: 100 | 30 days supply | Qty: 30 | Fill #2

## 2018-03-21 ENCOUNTER — Ambulatory Visit: Payer: 59 | Attending: Orthopedic Surgery

## 2018-03-21 DIAGNOSIS — R293 Abnormal posture: Secondary | ICD-10-CM | POA: Insufficient documentation

## 2018-03-21 DIAGNOSIS — M6281 Muscle weakness (generalized): Secondary | ICD-10-CM | POA: Diagnosis not present

## 2018-03-21 DIAGNOSIS — M25512 Pain in left shoulder: Secondary | ICD-10-CM | POA: Diagnosis not present

## 2018-03-21 DIAGNOSIS — G8929 Other chronic pain: Secondary | ICD-10-CM | POA: Diagnosis not present

## 2018-03-21 DIAGNOSIS — M25511 Pain in right shoulder: Secondary | ICD-10-CM | POA: Insufficient documentation

## 2018-03-21 DIAGNOSIS — M25612 Stiffness of left shoulder, not elsewhere classified: Secondary | ICD-10-CM | POA: Diagnosis not present

## 2018-03-21 DIAGNOSIS — M25611 Stiffness of right shoulder, not elsewhere classified: Secondary | ICD-10-CM | POA: Diagnosis not present

## 2018-03-21 NOTE — Therapy (Signed)
[facility] [address]  [place], [place], [address] Phone: [phone]   Fax:  [phone]  Physical Therapy Treatment  Patient Details  Name: Larry Davis MRN: [id number] Date of Birth: 1955/11/02 Referring Provider (PT): Justice Britain, MD   Encounter Date: 03/21/2018  PT End of Session - 03/21/18 0849    Visit Number  10    Number of Visits  16    Date for PT Re-Evaluation  04/11/18    Authorization Type  [facility]    PT Start Time  0846    PT Stop Time  0940    PT Time Calculation (min)  54 min    Activity Tolerance  Patient tolerated treatment well    Behavior During Therapy  [facility] for tasks assessed/performed       Past Medical History:  Diagnosis Date  . Allergy   . 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 Diabetes mellitus without complication ([place])   . PONV (postoperative nausea and vomiting)   . Prostate cancer ([place]) 03/2014  . Urticaria     Past Surgical History:  Procedure Laterality Date  . ADENOIDECTOMY    . CHOLECYSTECTOMY  2000  . COLONOSCOPY    . LYMPHADENECTOMY Bilateral 06/14/2014   Procedure: PELVIC LYMPH NODE DISSECTION;  Surgeon: Alexis Frock, MD;  [facility];  Service: Urology;  Laterality: Bilateral;  . REVERSE SHOULDER ARTHROPLASTY Left 01/19/2018  . REVERSE SHOULDER ARTHROPLASTY Left 01/19/2018   Procedure: REVERSE SHOULDER ARTHROPLASTY;  Surgeon: Justice Britain, MD;  Location: [place];  Service: Orthopedics;  Laterality: Left;  116mn  Please move 7:30am [place] to 10am  . ROBOT ASSISTED LAPAROSCOPIC RADICAL PROSTATECTOMY N/A 06/14/2014   Procedure: ROBOTIC ASSISTED LAPAROSCOPIC RADICAL PROSTATECTOMY WITH INDOCYANINE GREEN DYE INJECTION;  Surgeon: TAlexis Frock MD;  [facility];  Service: Urology;  Laterality: N/A;  . SHOULDER SURGERY Right 1990   arthroscopy  . TONSILLECTOMY      There were no vitals filed for this visit.  Subjective Assessment - 03/21/18 0847     Subjective  Pt. reporting L UE fatigue following last session however denies soreness.      Pertinent History  L reverse TSA 01/19/18    Patient Stated Goals  "improve flexibility & ROM"    Currently in Pain?  No/denies    Pain Score  0-No pain    Multiple Pain Sites  No         [facility] PT Assessment - 03/21/18 0001      Assessment   Medical Diagnosis  L reverse TSA    Referring Provider (PT)  KJustice Britain MD    Onset Date/Surgical Date  01/19/18    Hand Dominance  Right    Next MD Visit  none scheduled      AROM   AROM Assessment Site  Shoulder    Right/Left Shoulder  Left    Left Shoulder Flexion  126 Degrees    Left Shoulder ABduction  130 Degrees    Left Shoulder Internal Rotation  --   FIR to belt line; pt. instructed to avoid painful ROM    Left Shoulder External Rotation  --   FER T2     PROM   PROM Assessment Site  Shoulder    Right/Left Shoulder  Left    Left Shoulder Flexion  147 Degrees    Left Shoulder ABduction  135 Degrees    Left Shoulder Internal Rotation  67 Degrees  Left Shoulder External Rotation  60 Degrees      Strength   Strength Assessment Site  Shoulder    Right/Left Shoulder  Right;Left    Right Shoulder Flexion  4+/5    Right Shoulder ABduction  4+/5    Right Shoulder Internal Rotation  4-/5    Right Shoulder External Rotation  4+/5    Left Shoulder Flexion  4/5    Left Shoulder ABduction  4/5    Left Shoulder Internal Rotation  4/5    Left Shoulder External Rotation  3+/5                   [facility] Adult PT Treatment/Exercise - 03/21/18 0912      Shoulder Exercises: Supine   Protraction  Left;15 reps;Strengthening;Weights    Protraction Weight (lbs)  3    External Rotation  Left;20 reps;AROM    External Rotation Limitations  short range 40 dg IR to 40 dg ER    Internal Rotation  Left;20 reps;AROM    Internal Rotation Limitations  short range 40 dg IR to 40 dg ER      Shoulder Exercises: Sidelying   External Rotation   Left;10 reps;AAROM    External Rotation Limitations  therapist assistance for concentric; pt. controlling eccentric lowering     Flexion  Left;10 reps;AROM    Flexion Limitations  min cueing for proper plane of movement       Shoulder Exercises: Standing   Flexion  Left;AAROM   x 12 reps    Flexion Limitations  orange p-ball on wall     ABduction  Left;AAROM;Strengthening   x 12 reps    ABduction Limitations  flexion/scaption plane; min tactile cueing for reduction in scapular elevation       Shoulder Exercises: Pulleys   Flexion  2 minutes    Scaption  2 minutes      Vasopneumatic   Number Minutes Vasopneumatic   10 minutes    Vasopnuematic Location   Shoulder    Vasopneumatic Pressure  Low    Vasopneumatic Temperature   coldest      Manual Therapy   Manual Therapy  Soft tissue mobilization    Soft tissue mobilization  L lateral deltoid STM in area of tenderness               PT Short Term Goals - 03/14/18 0909      PT SHORT TERM GOAL #1   Title  Independent with initial HEP    Status  Achieved      PT SHORT TERM GOAL #2   Title  L shoulder ER >/= 45 dg    Status  Achieved        PT Long Term Goals - 03/21/18 0906      PT LONG TERM GOAL #1   Title  Independent with ongoing HEP    Status  Partially Met      PT LONG TERM GOAL #2   Title  L shoulder flexion & abduction AROM WFL w/o increased pain    Status  On-going      PT LONG TERM GOAL #3   Title  L shoulder strength grossly >/= 4/5 for improved functional use of L UE    Status  Partially Met      PT LONG TERM GOAL #4   Title  Pt will report ability to use L UE for basic ADLs and light household chores w/o increased pain    Status  Achieved            Plan - 03/21/18 0849    Clinical Impression Statement  Pt. reporting L UE fatigue after last session however denies soreness.  Able to tolerate mild progression of elevation activities today with reduction in reported fatigue.  Still requiring  some cueing to avoid excessive scapular elevation with flexion, scaption activities.  Demonstrating improved control with L shoulder gravity-neutral IR/ER AROM in supine today.  Still requiring therapist assistance for concentric ER in sidelying to access full available ROM.  L shoulder ROM progressing well with AROM, PROM improvement in all planes progressing toward LTG #2.  Able to partially achieve L UE strength goal with MMT today.  Reports he is now able to perform basic ADL's and light household chores without increased L shoulder pain achieving LTG #4.  Ended visit with ice/compression to L shoulder to reduce post-exercise soreness and swelling.  Will continue to progress per protocol.      Rehab Potential  Good    PT Treatment/Interventions  Patient/family education;Neuromuscular re-education;Therapeutic exercise;Therapeutic activities;Manual techniques;Passive range of motion;Electrical Stimulation;Moist Heat;Cryotherapy;Vasopneumatic Device;Dry needling;Iontophoresis 82m/ml Dexamethasone;ADLs/Self Care Home Management    PT Next Visit Plan  L reverse TSA protocol    Consulted and Agree with Plan of Care  Patient       Patient will benefit from skilled therapeutic intervention in order to improve the following deficits and impairments:  Pain, Decreased range of motion, Decreased strength, Impaired UE functional use, Impaired flexibility, Increased muscle spasms, Decreased activity tolerance  Visit Diagnosis: Chronic left shoulder pain  Stiffness of left shoulder, not elsewhere classified  Muscle weakness (generalized)  Abnormal posture     Problem List Patient Active Problem List   Diagnosis Date Noted  . 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 Rotator cuff tear arthropathy 05/30/2017  . Abnormal electrocardiogram (ECG) (EKG) 03/14/2017  . Overweight (BMI 25.0-29.9) 09/23/2016  . 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  02/25/2016  . Shoulder joint pain 08/15/2015  . Hyperlipidemia 03/18/2015  . [place] 05/12/[place]  . Perennial allergic rhinitis 05/12/2014  . Insomnia 05/12/2014  . Type 2 diabetes mellitus with hyperglycemia, without long-term current use of insulin ([place] 05/11/2014    MBess Harvest PTA 03/21/18 12:22 PM   [facility] [address] [place] [address]: [phone]  Fax:  [phone] Name: JMERLE CIRELLIMRN: [id number] of Birth: [birth year]

## 2018-03-23 ENCOUNTER — Encounter: Payer: 59 | Admitting: Physical Therapy

## 2018-03-28 ENCOUNTER — Ambulatory Visit: Payer: 59

## 2018-03-28 DIAGNOSIS — G8929 Other chronic pain: Secondary | ICD-10-CM | POA: Diagnosis not present

## 2018-03-28 DIAGNOSIS — M25612 Stiffness of left shoulder, not elsewhere classified: Secondary | ICD-10-CM

## 2018-03-28 DIAGNOSIS — M25512 Pain in left shoulder: Secondary | ICD-10-CM | POA: Diagnosis not present

## 2018-03-28 DIAGNOSIS — R293 Abnormal posture: Secondary | ICD-10-CM

## 2018-03-28 DIAGNOSIS — M25611 Stiffness of right shoulder, not elsewhere classified: Secondary | ICD-10-CM | POA: Diagnosis not present

## 2018-03-28 DIAGNOSIS — M6281 Muscle weakness (generalized): Secondary | ICD-10-CM

## 2018-03-28 DIAGNOSIS — M25511 Pain in right shoulder: Secondary | ICD-10-CM | POA: Diagnosis not present

## 2018-03-28 NOTE — Therapy (Addendum)
[facility] [address]  [place], [place], [address] Phone: [phone]   Fax:  [phone]  Physical Therapy Treatment  Patient Details  Name: Larry Davis MRN: [id number] Date of Birth: 19-Oct-1955 Referring Provider (PT): Justice Britain, MD   Encounter Date: 03/28/2018  PT End of Session - 03/28/18 0852    Visit Number  11    Number of Visits  16    Date for PT Re-Evaluation  04/11/18    Authorization Type  [facility]    PT Start Time  0847    PT Stop Time  0940    PT Time Calculation (min)  53 min    Activity Tolerance  Patient tolerated treatment well    Behavior During Therapy  [facility] for tasks assessed/performed       Past Medical History:  Diagnosis Date  . Allergy   . 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 Diabetes mellitus without complication ([place])   . PONV (postoperative nausea and vomiting)   . Prostate cancer ([place]) 03/2014  . Urticaria     Past Surgical History:  Procedure Laterality Date  . ADENOIDECTOMY    . CHOLECYSTECTOMY  2000  . COLONOSCOPY    . LYMPHADENECTOMY Bilateral 06/14/2014   Procedure: PELVIC LYMPH NODE DISSECTION;  Surgeon: Alexis Frock, MD;  [facility];  Service: Urology;  Laterality: Bilateral;  . REVERSE SHOULDER ARTHROPLASTY Left 01/19/2018  . REVERSE SHOULDER ARTHROPLASTY Left 01/19/2018   Procedure: REVERSE SHOULDER ARTHROPLASTY;  Surgeon: Justice Britain, MD;  Location: [facility];  Service: Orthopedics;  Laterality: Left;  142mn  Please move 7:30am [place] to 10am  . ROBOT ASSISTED LAPAROSCOPIC RADICAL PROSTATECTOMY N/A 06/14/2014   Procedure: ROBOTIC ASSISTED LAPAROSCOPIC RADICAL PROSTATECTOMY WITH INDOCYANINE GREEN DYE INJECTION;  Surgeon: TAlexis Frock MD;  [facility];  Service: Urology;  Laterality: N/A;  . SHOULDER SURGERY Right 1990   arthroscopy  . TONSILLECTOMY      There were no vitals filed for this visit.  Subjective Assessment - 03/28/18 0851     Subjective  Pt. reporting some increased L shoulder soreness without known trigger.      Pertinent History  L reverse TSA 01/19/18    Patient Stated Goals  "improve flexibility & ROM"    Currently in Pain?  No/denies    Pain Score  0-No pain   Pain rising to 5/10 ache pain at worst without known trigger which is short lasting   Pain Location  Shoulder    Pain Orientation  Left    Multiple Pain Sites  No                       [facility] Adult PT Treatment/Exercise - 03/28/18 0903      Shoulder Exercises: Seated   External Rotation  Both;15 reps    External Rotation Limitations  therapist manual assistance to attain end range L ER + cueing for scapular retraction required       Shoulder Exercises: Sidelying   External Rotation  Left;10 reps;AAROM   Pt. verbalizing L shoulder fatigue and pain following this    External Rotation Limitations  therapist assistance for concentric; pt. controlling eccentric lowering     Flexion  Left;20 reps;AROM    Flexion Limitations  pain free    ABduction  Left;20 reps;AROM    ABduction Limitations  20 - 110 dg in scapular plane      Shoulder Exercises: Pulleys   Flexion  2 minutes    Scaption  2 minutes      Shoulder Exercises: ROM/Strengthening   Rebounder  Lvl 1.0, 1 min forward/1 min backwards      Vasopneumatic   Number Minutes Vasopneumatic   10 minutes    Vasopnuematic Location   Shoulder    Vasopneumatic Pressure  Low    Vasopneumatic Temperature   coldest      Manual Therapy   Manual Therapy  Soft tissue mobilization;Myofascial release    Manual therapy comments  supine     Soft tissue mobilization  L STM lateral deltoid, posterior/inferior shoulder, L anterior shoulder/pec     Myofascial Release  TPR to L pec     Passive ROM  L PROM all direction to tolerance; increased discomfort into L end range ER              PT Education - 03/28/18 1811    Education Details  HEP update    Person(s) Educated  Patient     Methods  Explanation;Demonstration    Comprehension  Verbalized understanding;Returned demonstration;Verbal cues required;Need further instruction       PT Short Term Goals - 03/14/18 0909      PT SHORT TERM GOAL #1   Title  Independent with initial HEP    Status  Achieved      PT SHORT TERM GOAL #2   Title  L shoulder ER >/= 45 dg    Status  Achieved        PT Long Term Goals - 03/21/18 0906      PT LONG TERM GOAL #1   Title  Independent with ongoing HEP    Status  Partially Met      PT LONG TERM GOAL #2   Title  L shoulder flexion & abduction AROM WFL w/o increased pain    Status  On-going      PT LONG TERM GOAL #3   Title  L shoulder strength grossly >/= 4/5 for improved functional use of L UE    Status  Partially Met      PT LONG TERM GOAL #4   Title  Pt will report ability to use L UE for basic ADLs and light household chores w/o increased pain    Status  Achieved            Plan - 03/28/18 0902    Clinical Impression Statement  [name] reporting increased L shoulder fatigue and pain over weekend without known trigger.  Started session with increased L shoulder discomfort with gravity minimized ER motion in seated however this improved after L shoulder gentle PROM and STM/TPR to anterior shoulder musculature.  Pt. still demonstrates limited control with AROM ER in session today however improved with therapist manual guidance.  Ended visit with pt. noting improved L shoulder comfort and finished with ice/compression to L shoulder to reduce post-exercise soreness.  HEP updated.  Will continue to progress per protocol.      PT Treatment/Interventions  Patient/family education;Neuromuscular re-education;Therapeutic exercise;Therapeutic activities;Manual techniques;Passive range of motion;Electrical Stimulation;Moist Heat;Cryotherapy;Vasopneumatic Device;Dry needling;Iontophoresis 59m/ml Dexamethasone;ADLs/Self Care Home Management    PT Next Visit Plan  L reverse TSA  protocol    Consulted and Agree with Plan of Care  Patient       Patient will benefit from skilled therapeutic intervention in order to improve the following deficits and impairments:  Pain, Decreased range of motion, Decreased strength, Impaired UE functional use, Impaired flexibility, Increased muscle spasms, Decreased activity tolerance  Visit Diagnosis: Chronic left shoulder pain  Stiffness of left shoulder, not elsewhere classified  Muscle weakness (generalized)  Abnormal posture     Problem List Patient Active Problem List   Diagnosis Date Noted  . 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 Rotator cuff tear arthropathy 05/30/2017  . Abnormal electrocardiogram (ECG) (EKG) 03/14/2017  . Overweight (BMI 25.0-29.9) 09/23/2016  . 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 Shoulder joint pain 08/15/2015  . Hyperlipidemia 03/18/2015  . Prostate cancer ([place]) 05/12/2014  . Perennial allergic rhinitis 05/12/2014  . Insomnia 05/12/2014  . Type 2 diabetes mellitus with hyperglycemia, without long-term current use of insulin ([place]) 05/11/2014    Bess Harvest, PTA 03/28/18 6:11 PM   [facility] [address]  [place], [place], [address] Phone: [phone]   Fax:  [phone]  Name: Larry Davis MRN: [id number] Date of Birth: January 12, 1956

## 2018-03-30 ENCOUNTER — Encounter: Payer: Self-pay | Admitting: Physical Therapy

## 2018-03-30 ENCOUNTER — Ambulatory Visit: Payer: 59 | Admitting: Physical Therapy

## 2018-03-30 DIAGNOSIS — G8929 Other chronic pain: Secondary | ICD-10-CM | POA: Diagnosis not present

## 2018-03-30 DIAGNOSIS — M25611 Stiffness of right shoulder, not elsewhere classified: Secondary | ICD-10-CM

## 2018-03-30 DIAGNOSIS — M25512 Pain in left shoulder: Secondary | ICD-10-CM | POA: Diagnosis not present

## 2018-03-30 DIAGNOSIS — M6281 Muscle weakness (generalized): Secondary | ICD-10-CM | POA: Diagnosis not present

## 2018-03-30 DIAGNOSIS — R293 Abnormal posture: Secondary | ICD-10-CM

## 2018-03-30 DIAGNOSIS — M25511 Pain in right shoulder: Secondary | ICD-10-CM | POA: Diagnosis not present

## 2018-03-30 DIAGNOSIS — M25612 Stiffness of left shoulder, not elsewhere classified: Secondary | ICD-10-CM | POA: Diagnosis not present

## 2018-03-30 NOTE — Therapy (Signed)
[facility] [address]  [place], [place], [address] Phone: [phone]   Fax:  [phone]  Physical Therapy Treatment  Patient Details  Name: Larry Davis MRN: [id number] Date of Birth: October 11, 1955 Referring Provider (PT): Justice Britain, MD   Encounter Date: 03/30/2018  PT End of Session - 03/30/18 0848    Visit Number  12    Number of Visits  16    Date for PT Re-Evaluation  04/11/18    Authorization Type  [facility]    PT Start Time  0848    PT Stop Time  0947    PT Time Calculation (min)  59 min    Activity Tolerance  Patient tolerated treatment well    Behavior During Therapy  [facility] for tasks assessed/performed       Past Medical History:  Diagnosis Date  . Allergy   . 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 Diabetes mellitus without complication ([place])   . PONV (postoperative nausea and vomiting)   . Prostate cancer ([place]) 03/2014  . Urticaria     Past Surgical History:  Procedure Laterality Date  . ADENOIDECTOMY    . CHOLECYSTECTOMY  2000  . COLONOSCOPY    . LYMPHADENECTOMY Bilateral 06/14/2014   Procedure: PELVIC LYMPH NODE DISSECTION;  Surgeon: Alexis Frock, MD;  Location: [facility];  Service: Urology;  Laterality: Bilateral;  . REVERSE SHOULDER ARTHROPLASTY Left 01/19/2018  . REVERSE SHOULDER ARTHROPLASTY Left 01/19/2018   Procedure: REVERSE SHOULDER ARTHROPLASTY;  Surgeon: Justice Britain, MD;  Location: [place];  Service: Orthopedics;  Laterality: Left;  110mn  Please move 7:30am [place] to 10am  . ROBOT ASSISTED LAPAROSCOPIC RADICAL PROSTATECTOMY N/A 06/14/2014   Procedure: ROBOTIC ASSISTED LAPAROSCOPIC RADICAL PROSTATECTOMY WITH INDOCYANINE GREEN DYE INJECTION;  Surgeon: TAlexis Frock MD;  [facility];  Service: Urology;  Laterality: N/A;  . SHOULDER SURGERY Right 1990   arthroscopy  . TONSILLECTOMY      There were no vitals filed for this visit.  Subjective Assessment - 03/30/18 0854     Subjective  Pt reporting his shoulder is more sore today and had trouble sleeping last night - no known trigger    Pertinent History  L reverse TSA 01/19/18    Patient Stated Goals  "improve flexibility & ROM"    Currently in Pain?  Yes    Pain Score  4     Pain Location  Shoulder    Pain Orientation  Left                       [facility] Adult PT Treatment/Exercise - 03/30/18 0848      Exercises   Exercises  Shoulder      Shoulder Exercises: Seated   Flexion  Left;10 reps;AROM    Flexion Limitations  cues to depress shoulder to avoid shoulder hiking    Abduction  Left;10 reps;AROM    ABduction Limitations  sacption - cues to depress shoulder to avoid shoulder hiking      Shoulder Exercises: Sidelying   External Rotation  Left;10 reps;AROM   2 sets   External Rotation Limitations  better AROM following STM/DTM/DN     Flexion  Left;20 reps;AROM    Flexion Limitations  cues to avoid horiz adduction    ABduction  Left;20 reps;Strengthening;Weights    ABduction Weight (lbs)  1    ABduction Limitations  20 - 110 dg in scapular plane      Shoulder  Exercises: ROM/Strengthening   Rebounder  L1.5 x 4 min (2' fwd/2' back)      Modalities   Modalities  Vasopneumatic      Vasopneumatic   Number Minutes Vasopneumatic   15 minutes    Vasopnuematic Location   Shoulder    Vasopneumatic Pressure  Low    Vasopneumatic Temperature   coldest      Manual Therapy   Manual Therapy  Soft tissue mobilization;Myofascial release    Manual therapy comments  supine     Soft tissue mobilization  L anterior deltoid & posterior/inferior capsule    Myofascial Release  manual TPR to L ant deltoid, lats & teres group       Trigger Point Dry Needling - 03/30/18 0848    Consent Given?  Yes    Education Handout Provided  No   pt declined - has prior experience with DN   Muscles Treated Upper Body  --   L ant deltoid, lats & teres grp: + twitch response            PT Short  Term Goals - 03/14/18 0909      PT SHORT TERM GOAL #1   Title  Independent with initial HEP    Status  Achieved      PT SHORT TERM GOAL #2   Title  L shoulder ER >/= 45 dg    Status  Achieved        PT Long Term Goals - 03/21/18 0906      PT LONG TERM GOAL #1   Title  Independent with ongoing HEP    Status  Partially Met      PT LONG TERM GOAL #2   Title  L shoulder flexion & abduction AROM WFL w/o increased pain    Status  On-going      PT LONG TERM GOAL #3   Title  L shoulder strength grossly >/= 4/5 for improved functional use of L UE    Status  Partially Met      PT LONG TERM GOAL #4   Title  Pt will report ability to use L UE for basic ADLs and light household chores w/o increased pain    Status  Achieved            Plan - 03/30/18 0930    Clinical Impression Statement  Larry Davis reporting increased sleep disturbance due to L posterior shoulder pain as well as painful taut band in L anterior deltoid - addressed both areas with manual therapy including DN with pt reporting significant reduction in pain and movement restriction with pt functionally able to ER L shoulder beyond neutral in sidelying position where previously he was unable to do this due to pain/tightness. AROM and strength appears to be progessing well with some monitoring still necessary to avoid subsitution.    Rehab Potential  Good    PT Treatment/Interventions  Patient/family education;Neuromuscular re-education;Therapeutic exercise;Therapeutic activities;Manual techniques;Passive range of motion;Electrical Stimulation;Moist Heat;Cryotherapy;Vasopneumatic Device;Dry needling;Iontophoresis 49m/ml Dexamethasone;ADLs/Self Care Home Management    PT Next Visit Plan  L reverse TSA protocol    Consulted and Agree with Plan of Care  Patient       Patient will benefit from skilled therapeutic intervention in order to improve the following deficits and impairments:  Pain, Decreased range of motion, Decreased  strength, Impaired UE functional use, Impaired flexibility, Increased muscle spasms, Decreased activity tolerance  Visit Diagnosis: Chronic left shoulder pain  Stiffness of left shoulder, not elsewhere classified  Muscle weakness (generalized)  Abnormal posture  Chronic right shoulder pain  Stiffness of right shoulder, not elsewhere classified     Problem List Patient Active Problem List   Diagnosis Date Noted  . 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 Rotator cuff tear arthropathy 05/30/2017  . Abnormal electrocardiogram (ECG) (EKG) 03/14/2017  . Overweight (BMI 25.0-29.9) 09/23/2016  . 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 Shoulder joint pain 08/15/2015  . Hyperlipidemia 03/18/2015  . Prostate cancer ([place]) 05/12/2014  . Perennial allergic rhinitis 05/12/2014  . Insomnia 05/12/2014  . Type 2 diabetes mellitus with hyperglycemia, without long-term current use of insulin ([place]) 05/11/2014    Percival Spanish, PT, MPT 03/30/2018, 12:11 PM  [facility] [address]  [place], [place], [address] Phone: [phone]   Fax:  [phone]  Name: Larry Davis MRN: [id number] Date of Birth: [birth year]

## 2018-04-03 MED FILL — MELOXICAM 15 MG TABLET: 15 | 30 days supply | Qty: 30 | Fill #1

## 2018-04-04 ENCOUNTER — Ambulatory Visit: Payer: 59 | Admitting: Physical Therapy

## 2018-04-04 ENCOUNTER — Encounter: Payer: Self-pay | Admitting: Physical Therapy

## 2018-04-04 DIAGNOSIS — M6281 Muscle weakness (generalized): Secondary | ICD-10-CM

## 2018-04-04 DIAGNOSIS — M25612 Stiffness of left shoulder, not elsewhere classified: Secondary | ICD-10-CM

## 2018-04-04 DIAGNOSIS — M25611 Stiffness of right shoulder, not elsewhere classified: Secondary | ICD-10-CM | POA: Diagnosis not present

## 2018-04-04 DIAGNOSIS — G8929 Other chronic pain: Secondary | ICD-10-CM | POA: Diagnosis not present

## 2018-04-04 DIAGNOSIS — M25512 Pain in left shoulder: Principal | ICD-10-CM

## 2018-04-04 DIAGNOSIS — M25511 Pain in right shoulder: Secondary | ICD-10-CM | POA: Diagnosis not present

## 2018-04-04 DIAGNOSIS — R293 Abnormal posture: Secondary | ICD-10-CM

## 2018-04-04 NOTE — Therapy (Signed)
[facility] [address]  [place], [place], [address] Phone: [phone]   Fax:  [phone]  Physical Therapy Treatment  Patient Details  Name: Larry Davis MRN: [id number] Date of Birth: 1955/02/18 Referring Provider (PT): Justice Britain, MD   Encounter Date: 04/04/2018  PT End of Session - 04/04/18 0846    Visit Number  13    Number of Visits  16    Date for PT Re-Evaluation  04/11/18    Authorization Type  [facility]    PT Start Time  0846    PT Stop Time  0944    PT Time Calculation (min)  58 min    Activity Tolerance  Patient tolerated treatment well    Behavior During Therapy  [facility] for tasks assessed/performed       Past Medical History:  Diagnosis Date  . Allergy   . 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 Diabetes mellitus without complication ([place])   . PONV (postoperative nausea and vomiting)   . Prostate cancer ([place]) 03/2014  . Urticaria     Past Surgical History:  Procedure Laterality Date  . ADENOIDECTOMY    . CHOLECYSTECTOMY  2000  . COLONOSCOPY    . LYMPHADENECTOMY Bilateral 06/14/2014   Procedure: PELVIC LYMPH NODE DISSECTION;  Surgeon: Alexis Frock, MD;  [facility];  Service: Urology;  Laterality: Bilateral;  . REVERSE SHOULDER ARTHROPLASTY Left 01/19/2018  . REVERSE SHOULDER ARTHROPLASTY Left 01/19/2018   Procedure: REVERSE SHOULDER ARTHROPLASTY;  Surgeon: Justice Britain, MD;  Location: [place];  Service: Orthopedics;  Laterality: Left;  178mn  Please move 7:30am [place] to 10am  . ROBOT ASSISTED LAPAROSCOPIC RADICAL PROSTATECTOMY N/A 06/14/2014   Procedure: ROBOTIC ASSISTED LAPAROSCOPIC RADICAL PROSTATECTOMY WITH INDOCYANINE GREEN DYE INJECTION;  Surgeon: TAlexis Frock MD;  [facility];  Service: Urology;  Laterality: N/A;  . SHOULDER SURGERY Right 1990   arthroscopy  . TONSILLECTOMY      There were no vitals filed for this visit.  Subjective Assessment - 04/04/18 0849     Subjective  Pt reports DN really helped last session - took care of the throbbing and the tightness and pain is better by 1/2.    Pertinent History  L reverse TSA 01/19/18    Patient Stated Goals  "improve flexibility & ROM"    Currently in Pain?  Yes    Pain Score  2     Pain Location  Shoulder    Pain Orientation  Left    Pain Descriptors / Indicators  Dull    Pain Type  Surgical pain;Acute pain         [facility] PT Assessment - 04/04/18 0846      Assessment   Medical Diagnosis  L reverse TSA    Referring Provider (PT)  KJustice Britain MD    Onset Date/Surgical Date  01/19/18    Next MD Visit  05/01/18                   [facility] - 04/04/18 0846      Exercises   Exercises  Shoulder      Shoulder Exercises: ROM/Strengthening   Rebounder  L1.5 x 6 min (3' fwd/3' back)      Modalities   Modalities  Vasopneumatic;Electrical Stimulation      Electrical Stimulation   Electrical Stimulation Location  L anterolateral shoulder    Electrical Stimulation Action  IFC    Electrical Stimulation Parameters  80-150 Hz, intensity to pt tolerance x 15'    Electrical Stimulation Goals  Pain;Tone      Vasopneumatic   Number Minutes Vasopneumatic   15 minutes    Vasopnuematic Location   Shoulder    Vasopneumatic Pressure  Low    Vasopneumatic Temperature   coldest      Manual Therapy   Manual Therapy  Soft tissue mobilization;Myofascial release    Manual therapy comments  supine     Soft tissue mobilization  L anterior deltoid, pecs, biceps & posterior/inferior capsule    Myofascial Release  manual TPR to L ant deltoid & pecs       Trigger Point Dry Needling - 04/04/18 0846    Consent Given?  Yes    Muscles Treated Upper Body  Pectoralis major   & L ant deltoid: + twitch response; dec mm tension   Pectoralis Major Response  Twitch response elicited;Palpable increased muscle length   Lt            PT Short Term Goals - 03/14/18 0909       PT SHORT TERM GOAL #1   Title  Independent with initial HEP    Status  Achieved      PT SHORT TERM GOAL #2   Title  L shoulder ER >/= 45 dg    Status  Achieved        PT Long Term Goals - 03/21/18 0906      PT LONG TERM GOAL #1   Title  Independent with ongoing HEP    Status  Partially Met      PT LONG TERM GOAL #2   Title  L shoulder flexion & abduction AROM WFL w/o increased pain    Status  On-going      PT LONG TERM GOAL #3   Title  L shoulder strength grossly >/= 4/5 for improved functional use of L UE    Status  Partially Met      PT LONG TERM GOAL #4   Title  Pt will report ability to use L UE for basic ADLs and light household chores w/o increased pain    Status  Achieved            Plan - 04/04/18 0852    Clinical Impression Statement  Larry Davis reporting he feels as if he "has turned the corner with motion" of the the L shoulder in the past week, noting good benefit from manual therapy and DN last session with resolution of throbbing & tightness as well as 50% pain reduction. Has not need to take much in the way of pain meds other than occasional Tylenol recently. Does note small area of ttp and tightness/swelling in L anterior shoulder today which he first noticed yesterday - taut bands noted in L anterior deltoid and pec major which were addressed with further manual therapy and DN with positive twitch response and paplapble reduction in muscle tension. Pt still noting some sensitivity to touch following manual therapy and DN, therefore concluded session with vasopneumatic compression and estim for further relief of pain and swelling.    Rehab Potential  Good    PT Treatment/Interventions  Patient/family education;Neuromuscular re-education;Therapeutic exercise;Therapeutic activities;Manual techniques;Passive range of motion;Electrical Stimulation;Moist Heat;Cryotherapy;Vasopneumatic Device;Dry needling;Iontophoresis 21m/ml Dexamethasone;ADLs/Self Care Home Management     PT Next Visit Plan  Assess goal status and readiness to transtion to HEP (with review/update as indicated) vs need for recert; L reverse TSA protocol    Consulted and Agree  with Plan of Care  Patient       Patient will benefit from skilled therapeutic intervention in order to improve the following deficits and impairments:  Pain, Decreased range of motion, Decreased strength, Impaired UE functional use, Impaired flexibility, Increased muscle spasms, Decreased activity tolerance  Visit Diagnosis: Chronic left shoulder pain  Stiffness of left shoulder, not elsewhere classified  Muscle weakness (generalized)  Abnormal posture     Problem List Patient Active Problem List   Diagnosis Date Noted  . 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 Rotator cuff tear arthropathy 05/30/2017  . Abnormal electrocardiogram (ECG) (EKG) 03/14/2017  . Overweight (BMI 25.0-29.9) 09/23/2016  . 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 Shoulder joint pain 08/15/2015  . Hyperlipidemia 03/18/2015  . Prostate cancer ([place]) 05/12/2014  . Perennial allergic rhinitis 05/12/2014  . Insomnia 05/12/2014  . Type 2 diabetes mellitus with hyperglycemia, without long-term current use of insulin ([place]) 05/11/2014    Percival Spanish, PT, MPT 04/04/2018, 11:59 AM  [facility] [address]  [place], [place], [address] Phone: [phone]   Fax:  [phone]  Name: Larry Davis MRN: [id number] Date of Birth: Aug 21, 1955

## 2018-04-06 ENCOUNTER — Encounter: Payer: Self-pay | Admitting: Physical Therapy

## 2018-04-06 ENCOUNTER — Ambulatory Visit: Payer: 59 | Admitting: Physical Therapy

## 2018-04-06 DIAGNOSIS — M6281 Muscle weakness (generalized): Secondary | ICD-10-CM

## 2018-04-06 DIAGNOSIS — G8929 Other chronic pain: Secondary | ICD-10-CM | POA: Diagnosis not present

## 2018-04-06 DIAGNOSIS — M25611 Stiffness of right shoulder, not elsewhere classified: Secondary | ICD-10-CM | POA: Diagnosis not present

## 2018-04-06 DIAGNOSIS — R293 Abnormal posture: Secondary | ICD-10-CM | POA: Diagnosis not present

## 2018-04-06 DIAGNOSIS — M25612 Stiffness of left shoulder, not elsewhere classified: Secondary | ICD-10-CM | POA: Diagnosis not present

## 2018-04-06 DIAGNOSIS — M25511 Pain in right shoulder: Secondary | ICD-10-CM | POA: Diagnosis not present

## 2018-04-06 DIAGNOSIS — M25512 Pain in left shoulder: Principal | ICD-10-CM

## 2018-04-06 NOTE — Therapy (Signed)
[facility] [address]  [place], [place], [address] Phone: [phone]   Fax:  [phone]  Physical Therapy Treatment  Patient Details  Name: Larry Davis MRN: [id number] Date of Birth: [birth year] Referring Provider (PT): Justice Britain, MD   Encounter Date: 04/06/2018  PT End of Session - 04/06/18 0935    Visit Number  14    Number of Visits  16    Date for PT Re-Evaluation  04/11/18    Authorization Type  [facility]    PT Start Time  0935    PT Stop Time  1039    PT Time Calculation (min)  64 min    Activity Tolerance  Patient tolerated treatment well    Behavior During Therapy  [facility] for tasks assessed/performed       Past Medical History:  Diagnosis Date  . Allergy   . 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 Diabetes mellitus without complication ([place])   . PONV (postoperative nausea and vomiting)   . Prostate cancer ([place]) 03/2014  . Urticaria     Past Surgical History:  Procedure Laterality Date  . ADENOIDECTOMY    . CHOLECYSTECTOMY  2000  . COLONOSCOPY    . LYMPHADENECTOMY Bilateral 06/14/2014   Procedure: PELVIC LYMPH NODE DISSECTION;  Surgeon: Alexis Frock, MD;  [facility];  Service: Urology;  Laterality: Bilateral;  . REVERSE SHOULDER ARTHROPLASTY Left 01/19/2018  . REVERSE SHOULDER ARTHROPLASTY Left 01/19/2018   Procedure: REVERSE SHOULDER ARTHROPLASTY;  Surgeon: Justice Britain, MD;  Location: [place];  Service: Orthopedics;  Laterality: Left;  168mn  Please move 7:30am [place] to 10am  . ROBOT ASSISTED LAPAROSCOPIC RADICAL PROSTATECTOMY N/A 06/14/2014   Procedure: ROBOTIC ASSISTED LAPAROSCOPIC RADICAL PROSTATECTOMY WITH INDOCYANINE GREEN DYE INJECTION;  Surgeon: TAlexis Frock MD;  [facility];  Service: Urology;  Laterality: N/A;  . SHOULDER SURGERY Right 1990   arthroscopy  . TONSILLECTOMY      There were no vitals filed for this visit.  Subjective Assessment - 04/06/18 0936     Subjective  Pt reporting good relief from manual & DN last visit - "shoulder feels really good today". Notes imrpoving functional use of L arm, reporting able to lift a child's carseat out of the car and place it up on a shelf with both hands.    Pertinent History  L reverse TSA 01/19/18    Patient Stated Goals  "improve flexibility & ROM"    Currently in Pain?  Yes    Pain Score  1     Pain Location  Shoulder    Pain Orientation  Left    Pain Descriptors / Indicators  Discomfort                       [facility] Adult PT Treatment/Exercise - 04/06/18 0935      Exercises   Exercises  Shoulder      Shoulder Exercises: Prone   Extension  Both;10 reps;AROM;Strengthening    Extension Limitations  I's standing leaning over green Pball on mat table    Horizontal ABduction 1  Both;10 reps;AROM;Strengthening    Horizontal ABduction 1 Limitations  T's standing leaning over green Pball on mat table      Shoulder Exercises: Sidelying   External Rotation  Left;15 reps;AROM    External Rotation Limitations  brief pause after 10 reps    Flexion  Left;15 reps;AROM    Flexion Limitations  cues to avoid  horiz adduction    ABduction  Left;15 reps;Strengthening;Weights    ABduction Weight (lbs)  2    ABduction Limitations  20 - 110 dg in scapular plane    Other Sidelying Exercises  L shoulder circles CW/CCW at 90 dg abdcution/scaption 1# x 10 each      Shoulder Exercises: Standing   External Rotation  Left;10 reps;Theraband;Strengthening    Theraband Level (Shoulder External Rotation)  Level 1 (Yellow)    External Rotation Limitations  isometric step-out    Internal Rotation  Left;10 reps;Theraband;Strengthening    Theraband Level (Shoulder Internal Rotation)  Level 1 (Yellow)    Extension  Both;15 reps;Theraband;Strengthening    Theraband Level (Shoulder Extension)  Level 2 (Red)    Extension Limitations  cues to slow pace & hold 5 sec    Row  Both;15 reps;Theraband;Strengthening     Theraband Level (Shoulder Row)  Level 2 (Red)    Other Standing Exercises  L shoulder cabinet reaches - flexion to 1st & 2nd shelves 1# x 10 each, scaption to 1st shelf 1# x 10      Shoulder Exercises: ROM/Strengthening   Rebounder  L2.0 x 6 min (3' fwd/3' back)      Modalities   Modalities  Vasopneumatic;Electrical Stimulation      Electrical Stimulation   Electrical Stimulation Location  L anterolateral shoulder    Electrical Stimulation Action  IFC    Electrical Stimulation Parameters  80-150 Hz, intensity to pt tolerance x 15'    Electrical Stimulation Goals  Pain;Tone      Vasopneumatic   Number Minutes Vasopneumatic   15 minutes    Vasopnuematic Location   Shoulder    Vasopneumatic Pressure  Low    Vasopneumatic Temperature   coldest      Manual Therapy   Manual Therapy  Soft tissue mobilization;Myofascial release    Manual therapy comments  supine     Soft tissue mobilization  L anterior deltoid    Myofascial Release  manual TPR to L ant deltoid       Trigger Point Dry Needling - 04/06/18 0935    Consent Given?  Yes    Muscles Treated Upper Body  --   L ant deltoid: + twitch response; dec mm tension            PT Short Term Goals - 03/14/18 0909      PT SHORT TERM GOAL #1   Title  Independent with initial HEP    Status  Achieved      PT SHORT TERM GOAL #2   Title  L shoulder ER >/= 45 dg    Status  Achieved        PT Long Term Goals - 03/21/18 0906      PT LONG TERM GOAL #1   Title  Independent with ongoing HEP    Status  Partially Met      PT LONG TERM GOAL #2   Title  L shoulder flexion & abduction AROM WFL w/o increased pain    Status  On-going      PT LONG TERM GOAL #3   Title  L shoulder strength grossly >/= 4/5 for improved functional use of L UE    Status  Partially Met      PT LONG TERM GOAL #4   Title  Pt will report ability to use L UE for basic ADLs and light household chores w/o increased pain    Status  Achieved  Plan - 04/06/18 1026    Clinical Impression Statement  [name] reporting good relief from manual therapy and DN last session with pain down to 1/10 but still slight ttp over anterior deltoid. Good tolerance for exercise progression including addition of theraband exercises but continues to fatigue quickly. Pt nearing end of POC therefore will plan to focus on HEP review/update and education in appropriate gym exercises in prep for transition to HEP.    Rehab Potential  Good    PT Treatment/Interventions  Patient/family education;Neuromuscular re-education;Therapeutic exercise;Therapeutic activities;Manual techniques;Passive range of motion;Electrical Stimulation;Moist Heat;Cryotherapy;Vasopneumatic Device;Dry needling;Iontophoresis 41m/ml Dexamethasone;ADLs/Self Care Home Management    PT Next Visit Plan  HEP review/update in prep for transition to HEP; L reverse TSA protocol    Consulted and Agree with Plan of Care  Patient       Patient will benefit from skilled therapeutic intervention in order to improve the following deficits and impairments:  Pain, Decreased range of motion, Decreased strength, Impaired UE functional use, Impaired flexibility, Increased muscle spasms, Decreased activity tolerance  Visit Diagnosis: Chronic left shoulder pain  Stiffness of left shoulder, not elsewhere classified  Muscle weakness (generalized)  Abnormal posture     Problem List Patient Active Problem List   Diagnosis Date Noted  . 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 Rotator cuff tear arthropathy 05/30/2017  . Abnormal electrocardiogram (ECG) (EKG) 03/14/2017  . Overweight (BMI 25.0-29.9) 09/23/2016  . 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 Shoulder joint pain 08/15/2015  . Hyperlipidemia 03/18/2015  . [place] 05/12/2014  . Perennial allergic rhinitis 05/12/2014  . Insomnia 05/12/2014  . Type  2 diabetes mellitus with hyperglycemia, without long-term current use of insulin ([place] 05/11/2014    JPercival Spanish PT, MPT 04/06/2018, 10:45 AM  [facility] [place] [address]Phone: [phone]  Fax:  [phone] Name: JKELLER BOUNDSMRN: [id number] of Birth: 2October 31, 1957

## 2018-04-11 ENCOUNTER — Telehealth: Payer: Self-pay | Admitting: *Deleted

## 2018-04-11 ENCOUNTER — Ambulatory Visit: Payer: 59 | Admitting: Physical Therapy

## 2018-04-11 ENCOUNTER — Encounter: Payer: Self-pay | Admitting: Physical Therapy

## 2018-04-11 DIAGNOSIS — M25512 Pain in left shoulder: Principal | ICD-10-CM

## 2018-04-11 DIAGNOSIS — M25511 Pain in right shoulder: Secondary | ICD-10-CM | POA: Diagnosis not present

## 2018-04-11 DIAGNOSIS — M6281 Muscle weakness (generalized): Secondary | ICD-10-CM

## 2018-04-11 DIAGNOSIS — R293 Abnormal posture: Secondary | ICD-10-CM | POA: Diagnosis not present

## 2018-04-11 DIAGNOSIS — M25612 Stiffness of left shoulder, not elsewhere classified: Secondary | ICD-10-CM | POA: Diagnosis not present

## 2018-04-11 DIAGNOSIS — G8929 Other chronic pain: Secondary | ICD-10-CM

## 2018-04-11 DIAGNOSIS — M25611 Stiffness of right shoulder, not elsewhere classified: Secondary | ICD-10-CM | POA: Diagnosis not present

## 2018-04-11 NOTE — Telephone Encounter (Signed)
Received request for Medical Records from [facility]; forwarded to HIM Department/Medical Records via fax at [phone]/SLS 02/25

## 2018-04-11 NOTE — Therapy (Signed)
[facility] [address]  [place], [place], [address] Phone: [phone]   Fax:  [phone]  Physical Therapy Treatment  Patient Details  Name: Larry Davis MRN: [id number] Date of Birth: 1955-05-16 Referring Provider (PT): Justice Britain, MD   Encounter Date: 04/11/2018  PT End of Session - 04/11/18 0850    Visit Number  15    Number of Visits  16    Date for PT Re-Evaluation  04/11/18    Authorization Type  [facility]    PT Start Time  0850    PT Stop Time  0945    PT Time Calculation (min)  55 min    Activity Tolerance  Patient tolerated treatment well    Behavior During Therapy  [facility] for tasks assessed/performed       Past Medical History:  Diagnosis Date  . Allergy   . 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 Diabetes mellitus without complication ([place])   . PONV (postoperative nausea and vomiting)   . Prostate cancer ([place]) 03/2014  . Urticaria     Past Surgical History:  Procedure Laterality Date  . ADENOIDECTOMY    . CHOLECYSTECTOMY  2000  . COLONOSCOPY    . LYMPHADENECTOMY Bilateral 06/14/2014   Procedure: PELVIC LYMPH NODE DISSECTION;  Surgeon: Alexis Frock, MD;  Location: [facility];  Service: Urology;  Laterality: Bilateral;  . REVERSE SHOULDER ARTHROPLASTY Left 01/19/2018  . REVERSE SHOULDER ARTHROPLASTY Left 01/19/2018   Procedure: REVERSE SHOULDER ARTHROPLASTY;  Surgeon: Justice Britain, MD;  Location: [place];  Service: Orthopedics;  Laterality: Left;  158mn  Please move 7:30am [place] to 10am  . ROBOT ASSISTED LAPAROSCOPIC RADICAL PROSTATECTOMY N/A 06/14/2014   Procedure: ROBOTIC ASSISTED LAPAROSCOPIC RADICAL PROSTATECTOMY WITH INDOCYANINE GREEN DYE INJECTION;  Surgeon: TAlexis Frock MD;  Location: [facility];  Service: Urology;  Laterality: N/A;  . SHOULDER SURGERY Right 1990   arthroscopy  . TONSILLECTOMY      There were no vitals filed for this visit.  Subjective Assessment - 04/11/18 0852     Subjective  Pt feels like current level of pain is going to be his new normal, but does note pain resolved with complete rest/relaxation.    Pertinent History  L reverse TSA 01/19/18    Patient Stated Goals  "improve flexibility & ROM"    Currently in Pain?  Yes    Pain Score  2    1-2/10   Pain Location  Shoulder    Pain Orientation  Left    Pain Descriptors / Indicators  Discomfort    Pain Type  Acute pain;Surgical pain    Pain Frequency  Intermittent                       [facility] Adult PT Treatment/Exercise - 04/11/18 0850      Exercises   Exercises  Shoulder      Shoulder Exercises: Supine   Flexion  Left;15 reps;Weights;Strengthening    Shoulder Flexion Weight (lbs)  2      Shoulder Exercises: Sidelying   External Rotation  Left;5 reps;10 reps;Strengthening;Weights    External Rotation Weight (lbs)  1    External Rotation Limitations  1st 5 reps w/o weight, last 10 reps with 1# (no loss of motion with added weight)    ABduction  Left;15 reps;Strengthening;Weights    ABduction Weight (lbs)  2    ABduction Limitations  20 - 110 dg in scapular plane  Shoulder Exercises: Standing   Extension  Left;15 reps;Strengthening;Weights    Extension Weight (lbs)  2    Extension Limitations  bent-over    Row  Left;15 reps;Strengthening;Weights    Row Weight (lbs)  2    Row Limitations  bent-over row      Shoulder Exercises: ROM/Strengthening   Rebounder  L2.0 x 6 min (3' fwd/3' back)    Cybex Row  15 reps    Cybex Row Limitations  15#      Modalities   Modalities  Vasopneumatic      Vasopneumatic   Number Minutes Vasopneumatic   15 minutes    Vasopnuematic Location   Shoulder    Vasopneumatic Pressure  Davis    Vasopneumatic Temperature   coldest      Manual Therapy   Manual Therapy  Soft tissue mobilization;Myofascial release    Manual therapy comments  supine     Soft tissue mobilization  L anterior deltoid    Myofascial Release  manual TPR to L ant  deltoid             PT Education - 04/11/18 0930    Education Details  HEP update - strengthening program    Person(s) Educated  Patient    Methods  Explanation;Demonstration;Handout    Comprehension  Verbalized understanding;Returned demonstration       PT Short Term Goals - 03/14/18 0909      PT SHORT TERM GOAL #1   Title  Independent with initial HEP    Status  Achieved      PT SHORT TERM GOAL #2   Title  L shoulder ER >/= 45 dg    Status  Achieved        PT Long Term Goals - 03/21/18 0906      PT LONG TERM GOAL #1   Title  Independent with ongoing HEP    Status  Partially Met      PT LONG TERM GOAL #2   Title  L shoulder flexion & abduction AROM WFL w/o increased pain    Status  On-going      PT LONG TERM GOAL #3   Title  L shoulder strength grossly >/= 4/5 for improved functional use of L UE    Status  Partially Met      PT LONG TERM GOAL #4   Title  Pt will report ability to use L UE for basic ADLs and light household chores w/o increased pain    Status  Achieved            Plan - 04/11/18 0856    Clinical Impression Statement  Umberto continues to report improving functional use of L arm with only intermittent pain at present - "almost 100% better than before surgery". HEP reviewed and updated, including instruction in relevant gym machines at very light weights, in prep for transition to HEP at end of current POC later this week with pt able to perform good return demonstration. Anticipate transition to HEP with probable 30 day hold following final goal assesssment next visit.    Rehab Potential  Good    PT Treatment/Interventions  Patient/family education;Neuromuscular re-education;Therapeutic exercise;Therapeutic activities;Manual techniques;Passive range of motion;Electrical Stimulation;Moist Heat;Cryotherapy;Vasopneumatic Device;Dry needling;Iontophoresis 59m/ml Dexamethasone;ADLs/Self Care Home Management    PT Next Visit Plan  Discharge  assessment + final HEP review/update in prep for transition to HEP per L reverse TSA protocol    Consulted and Agree with Plan of Care  Patient  Patient will benefit from skilled therapeutic intervention in order to improve the following deficits and impairments:  Pain, Decreased range of motion, Decreased strength, Impaired UE functional use, Impaired flexibility, Increased muscle spasms, Decreased activity tolerance  Visit Diagnosis: Chronic left shoulder pain  Stiffness of left shoulder, not elsewhere classified  Muscle weakness (generalized)  Abnormal posture     Problem List Patient Active Problem List   Diagnosis Date Noted  . 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 Rotator cuff tear arthropathy 05/30/2017  . Abnormal electrocardiogram (ECG) (EKG) 03/14/2017  . Overweight (BMI 25.0-29.9) 09/23/2016  . 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 Shoulder joint pain 08/15/2015  . Hyperlipidemia 03/18/2015  . Prostate cancer ([place]) 05/12/2014  . Perennial allergic rhinitis 05/12/2014  . Insomnia 05/12/2014  . Type 2 diabetes mellitus with hyperglycemia, without long-term current use of insulin ([place]) 05/11/2014    Percival Spanish, PT, MPT 04/11/2018, 1:08 PM  [facility] [address]  [place], [place], [address] Phone: [phone]   Fax:  [phone]  Name: JAHID WEIDA MRN: [id number] Date of Birth: August 11, 1955

## 2018-04-13 ENCOUNTER — Encounter: Payer: Self-pay | Admitting: Physical Therapy

## 2018-04-13 ENCOUNTER — Ambulatory Visit: Payer: 59 | Admitting: Physical Therapy

## 2018-04-13 DIAGNOSIS — G8929 Other chronic pain: Secondary | ICD-10-CM | POA: Diagnosis not present

## 2018-04-13 DIAGNOSIS — M25611 Stiffness of right shoulder, not elsewhere classified: Secondary | ICD-10-CM | POA: Diagnosis not present

## 2018-04-13 DIAGNOSIS — R293 Abnormal posture: Secondary | ICD-10-CM

## 2018-04-13 DIAGNOSIS — M6281 Muscle weakness (generalized): Secondary | ICD-10-CM

## 2018-04-13 DIAGNOSIS — M25512 Pain in left shoulder: Principal | ICD-10-CM

## 2018-04-13 DIAGNOSIS — M25612 Stiffness of left shoulder, not elsewhere classified: Secondary | ICD-10-CM

## 2018-04-13 DIAGNOSIS — M25511 Pain in right shoulder: Secondary | ICD-10-CM | POA: Diagnosis not present

## 2018-04-13 NOTE — Therapy (Addendum)
[facility] [address]  [place], [place], [address] Phone: [phone]   Fax:  [phone]  Physical Therapy Treatment / Discharge Summary  Patient Details  Name: Larry Davis MRN: [id number] Date of Birth: 1955/11/02 Referring Provider (PT): Justice Britain, MD   Encounter Date: 04/13/2018  PT End of Session - 04/13/18 0845    Visit Number  16    Number of Visits  16    Date for PT Re-Evaluation  04/11/18    Authorization Type  [facility]    PT Start Time  0845    PT Stop Time  0919    PT Time Calculation (min)  34 min    Activity Tolerance  Patient tolerated treatment well    Behavior During Therapy  [facility] for tasks assessed/performed       Past Medical History:  Diagnosis Date  . Allergy   . 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 Diabetes mellitus without complication ([place])   . PONV (postoperative nausea and vomiting)   . Prostate cancer ([place]) 03/2014  . Urticaria     Past Surgical History:  Procedure Laterality Date  . ADENOIDECTOMY    . CHOLECYSTECTOMY  2000  . COLONOSCOPY    . LYMPHADENECTOMY Bilateral 06/14/2014   Procedure: PELVIC LYMPH NODE DISSECTION;  Surgeon: Alexis Frock, MD;  [facility];  Service: Urology;  Laterality: Bilateral;  . REVERSE SHOULDER ARTHROPLASTY Left 01/19/2018  . REVERSE SHOULDER ARTHROPLASTY Left 01/19/2018   Procedure: REVERSE SHOULDER ARTHROPLASTY;  Surgeon: Justice Britain, MD;  Location: [facility];  Service: Orthopedics;  Laterality: Left;  127mn  Please move 7:30am [place] to 10am  . ROBOT ASSISTED LAPAROSCOPIC RADICAL PROSTATECTOMY N/A 06/14/2014   Procedure: ROBOTIC ASSISTED LAPAROSCOPIC RADICAL PROSTATECTOMY WITH INDOCYANINE GREEN DYE INJECTION;  Surgeon: TAlexis Frock MD;  [facility];  Service: Urology;  Laterality: N/A;  . SHOULDER SURGERY Right 1990   arthroscopy  . TONSILLECTOMY      There were no vitals filed for this visit.  Subjective  Assessment - 04/13/18 0847    Subjective  Pt noting some stiffness this morning but no pain. Noting good motion with L UE including reaching to back pocket for wallet or phone.    Pertinent History  L reverse TSA 01/19/18    Patient Stated Goals  "improve flexibility & ROM"    Currently in Pain?  No/denies         [facility] - 04/13/18 0845      Assessment   Medical Diagnosis  L reverse TSA    Referring Provider (PT)  KJustice Britain MD    Onset Date/Surgical Date  01/19/18    Next MD Visit  05/01/18      Observation/Other Assessments   Focus on Therapeutic Outcomes (FOTO)   Shoulder - 64% (36% limitation)      AROM   Left Shoulder Flexion  130 Degrees    Left Shoulder ABduction  121 Degrees    Left Shoulder Internal Rotation  71 Degrees    Left Shoulder External Rotation  57 Degrees      PROM   Left Shoulder Flexion  137 Degrees    Left Shoulder ABduction  126 Degrees    Left Shoulder Internal Rotation  72 Degrees    Left Shoulder External Rotation  61 Degrees      Strength   Right Shoulder Flexion  4/5    Right Shoulder ABduction  4+/5    Right  Shoulder Internal Rotation  4-/5    Right Shoulder External Rotation  4-/5    Left Shoulder Flexion  4/5    Left Shoulder ABduction  4+/5    Left Shoulder Internal Rotation  4/5    Left Shoulder External Rotation  4-/5                   [facility] Adult PT Treatment/Exercise - 04/13/18 0845      Exercises   Exercises  Shoulder      Shoulder Exercises: ROM/Strengthening   Rebounder  L2.5 x 6 min (3' fwd/3' back)      Manual Therapy   Manual Therapy  Soft tissue mobilization;Myofascial release    Manual therapy comments  supine     Soft tissue mobilization  L posterior deltoid & teres group    Myofascial Release  manual TPR to L teres group               PT Short Term Goals - 03/14/18 0909      PT SHORT TERM GOAL #1   Title  Independent with initial HEP    Status  Achieved      PT SHORT TERM  GOAL #2   Title  L shoulder ER >/= 45 dg    Status  Achieved        PT Long Term Goals - 04/13/18 0848      PT LONG TERM GOAL #1   Title  Independent with ongoing HEP    Status  Achieved      PT LONG TERM GOAL #2   Title  L shoulder flexion & abduction AROM WFL w/o increased pain    Status  Achieved      PT LONG TERM GOAL #3   Title  L shoulder strength grossly >/= 4/5 for improved functional use of L UE    Status  Achieved      PT LONG TERM GOAL #4   Title  Pt will report ability to use L UE for basic ADLs and light household chores w/o increased pain    Status  Achieved            Plan - 04/13/18 0849    Clinical Impression Statement  Kimble has demonstrated excellent progress with PT following L reverse TSA with restoration of functional ROM and strength in L shoulder. Pt reporting he is able to use L arm with most daily activities w/o limitation other than noting L arm quicker to fatigue. All goals met for this epsiode and pt feels confident with transitioning to HEP at this time. Will place pt on 30 day hold in the event that issues arise with HEP or upon MD f/u visit on 05/01/18.    Rehab Potential  Good    PT Treatment/Interventions  Patient/family education;Neuromuscular re-education;Therapeutic exercise;Therapeutic activities;Manual techniques;Passive range of motion;Electrical Stimulation;Moist Heat;Cryotherapy;Vasopneumatic Device;Dry needling;Iontophoresis 26m/ml Dexamethasone;ADLs/Self Care Home Management    PT Next Visit Plan  30 day hold    Consulted and Agree with Plan of Care  Patient       Patient will benefit from skilled therapeutic intervention in order to improve the following deficits and impairments:  Pain, Decreased range of motion, Decreased strength, Impaired UE functional use, Impaired flexibility, Increased muscle spasms, Decreased activity tolerance  Visit Diagnosis: Chronic left shoulder pain  Stiffness of left shoulder, not elsewhere  classified  Muscle weakness (generalized)  Abnormal posture     Problem List Patient Active Problem List  Diagnosis Date Noted  . 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 Rotator cuff tear arthropathy 05/30/2017  . Abnormal electrocardiogram (ECG) (EKG) 03/14/2017  . Overweight (BMI 25.0-29.9) 09/23/2016  . 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 Shoulder joint pain 08/15/2015  . Hyperlipidemia 03/18/2015  . Prostate cancer ([place]) 05/12/2014  . Perennial allergic rhinitis 05/12/2014  . Insomnia 05/12/2014  . Type 2 diabetes mellitus with hyperglycemia, without long-term current use of insulin ([place]) 05/11/2014    Percival Spanish, PT, MPT 04/13/2018, 9:27 AM  [facility] [address]  [place], [place], [address] Phone: [phone]   Fax:  [phone]  Name: SINAI MAHANY MRN: [id number] Date of Birth: 02-23-1955  PHYSICAL THERAPY DISCHARGE SUMMARY  Visits from Start of Care: 16  Current functional level related to goals / functional outcomes:   Refer to above clinical impression for status as of last visit on 04/13/2018. Patient was placed on hold for 30 days and has not needed to return to PT, therefore will proceed with discharge from PT for this episode.   Remaining deficits:   As above.    Education / Equipment:   HEP  Plan: Patient agrees to discharge.  Patient goals were met. Patient is being discharged due to meeting the stated rehab goals.  ?????     Percival Spanish, PT, MPT 05/11/18, 12:10 PM  [facility] [address]  [place], [place], [address] Phone: [phone]   Fax:  [phone]

## 2018-04-25 ENCOUNTER — Other Ambulatory Visit: Payer: Self-pay | Admitting: Internal Medicine

## 2018-04-25 MED FILL — INVOKANA 300 MG TABLET: 300 | 30 days supply | Qty: 30 | Fill #0

## 2018-04-25 MED FILL — JANUVIA 100 MG TABLET: 100 | 30 days supply | Qty: 30 | Fill #0

## 2018-05-10 MED FILL — metFORMIN HCL ER 500 MG TB2: 500 | 90 days supply | Qty: 360 | Fill #0

## 2018-05-31 MED FILL — INVOKANA 300 MG TABLET: 300 | 30 days supply | Qty: 30 | Fill #1

## 2018-05-31 MED FILL — JANUVIA 100 MG TABLET: 100 | 30 days supply | Qty: 30 | Fill #1

## 2018-05-31 MED FILL — traZODone HCL 50 MG TABS: 50 | 30 days supply | Qty: 30 | Fill #3

## 2018-06-05 ENCOUNTER — Encounter: Payer: Self-pay | Admitting: Family

## 2018-06-05 ENCOUNTER — Other Ambulatory Visit: Payer: Self-pay | Admitting: Family

## 2018-06-05 MED ORDER — EPINEPHRINE 0.3 MG/0.3ML IJ SOAJ: 0.3000 mg | Freq: Once | INTRAMUSCULAR | 1 refills | Status: DC

## 2018-06-05 MED FILL — EPINEPHRINE 0.3 MG AUTO-INJ: 0.3 | 2 days supply | Qty: 2 | Fill #0

## 2018-06-05 NOTE — Telephone Encounter (Signed)
Please contact pt to schedule a vitual visit for follow up and we can place lab orders at that time.

## 2018-06-05 NOTE — Telephone Encounter (Signed)
Scheduled for 4/24

## 2018-06-09 ENCOUNTER — Other Ambulatory Visit: Payer: Self-pay

## 2018-06-09 ENCOUNTER — Telehealth: Payer: Self-pay | Admitting: Family

## 2018-06-09 ENCOUNTER — Telehealth (INDEPENDENT_AMBULATORY_CARE_PROVIDER_SITE_OTHER): Payer: 59 | Admitting: Family

## 2018-06-09 VITALS — Wt 185.0 lb

## 2018-06-09 DIAGNOSIS — E118 Type 2 diabetes mellitus with unspecified complications: Secondary | ICD-10-CM

## 2018-06-09 DIAGNOSIS — G4700 Insomnia, unspecified: Secondary | ICD-10-CM

## 2018-06-09 DIAGNOSIS — E785 Hyperlipidemia, unspecified: Secondary | ICD-10-CM

## 2018-06-09 DIAGNOSIS — J3089 Other allergic rhinitis: Secondary | ICD-10-CM

## 2018-06-09 DIAGNOSIS — E1165 Type 2 diabetes mellitus with hyperglycemia: Secondary | ICD-10-CM | POA: Diagnosis not present

## 2018-06-09 MED ORDER — SITAGLIPTIN PHOSPHATE 100 MG PO TABS: 100.0000 mg | ORAL_TABLET | Freq: Every day | ORAL | 0 refills | Status: DC

## 2018-06-09 MED ORDER — TRAZODONE HCL 50 MG PO TABS: 25.0000 mg | ORAL_TABLET | Freq: Every evening | ORAL | 3 refills | Status: DC | PRN

## 2018-06-09 MED ORDER — METFORMIN HCL ER 500 MG PO TB24: ORAL_TABLET | ORAL | 0 refills | Status: DC

## 2018-06-09 NOTE — Progress Notes (Signed)
Virtual Visit via Video Note  I connected with Larry Davis on 06/09/18 at  7:40 AM EDT by a video enabled telemedicine application and verified that I am speaking with the correct person using two identifiers. This visit type was conducted due to national recommendations for restrictions regarding the COVID-19 Pandemic (e.g. social distancing).  This format is felt to be most appropriate for this patient at this time.   I discussed the limitations of evaluation and management by telemedicine and the availability of in person appointments. The patient expressed understanding and agreed to proceed.  Only the patient and myself were on today's video visit. The patient was at home and I was in my office at the time of today's visit.   History of Present Illness:  Patient is a 63 yr old male who presents today for follow up.  DM2- maintained on metformin, invokana, januvia. Reports sugars are well controlled. He is scheduled to see endocrinology in July.   Reports last weight 185.   Wt Readings from Last 3 Encounters:  01/19/18 189 lb (85.7 kg)  01/10/18 191 lb (86.6 kg)  10/12/17 189 lb 3.2 oz (85.8 kg)    Lab Results  Component Value Date   HGBA1C 6.4 05/04/2017   HGBA1C 7.1 12/24/2016   HGBA1C 6.9 09/23/2016   Lab Results  Component Value Date   MICROALBUR <0.7 08/11/2015   LDLCALC 96 03/14/2017   CREATININE 1.16 01/10/2018   Seasonal allergies- reports that he has been using flonase and claritin and symptoms have been stable.  Insomnia- reports that he continues trazodone. Uses some nights. Stable.  He had shoulder surgery back in December and reports that ortho placed him on meloxicam.  He stopped his aspirin 325mg  because he was on meloxicam. Reports that he is "almost done" with meloxicam.  Hyperlipidemia-  Lab Results  Component Value Date   CHOL 166 03/14/2017   HDL 41.70 03/14/2017   LDLCALC 96 03/14/2017   LDLDIRECT 142.0 11/13/2014   TRIG 142.0 03/14/2017   CHOLHDL 4 03/14/2017        Observations/Objective:  Gen: Awake, alert, no acute distress Resp: Breathing is even and non-labored Psych: calm/pleasant demeanor Neuro: Alert and Oriented x 3, + facial symmetry, speech is clear.   Assessment and Plan:  DM2- will obtain A1c, cmet, urine microalbumin. Stable sugars per patient. Pt advised to keep upcoming appointment with endocrinology. He has lost a few pounds and I commended him for this.   I advised pt to stop meloxicam and change to tylenol prn. Restart aspirin for cardiac prevention.  Seasonal allergies- stable on current regimen. Continue same.  Insomnia- stable on prn trazodone.   Follow Up Instructions:  Follow up in 3 months.  I discussed the assessment and treatment plan with the patient. The patient was provided an opportunity to ask questions and all were answered. The patient agreed with the plan and demonstrated an understanding of the instructions.   The patient was advised to call back or seek an in-person evaluation if the symptoms worsen or if the condition fails to improve as anticipated.    Nance Pear, NP

## 2018-06-09 NOTE — Telephone Encounter (Signed)
See mychart.  

## 2018-06-12 ENCOUNTER — Other Ambulatory Visit: Payer: Self-pay

## 2018-06-12 ENCOUNTER — Other Ambulatory Visit (INDEPENDENT_AMBULATORY_CARE_PROVIDER_SITE_OTHER): Payer: 59

## 2018-06-12 DIAGNOSIS — E785 Hyperlipidemia, unspecified: Secondary | ICD-10-CM

## 2018-06-12 DIAGNOSIS — E118 Type 2 diabetes mellitus with unspecified complications: Secondary | ICD-10-CM

## 2018-06-12 LAB — COMPREHENSIVE METABOLIC PANEL
ALT: 22 U/L (ref 0–53)
AST: 15 U/L (ref 0–37)
Albumin: 4.3 g/dL (ref 3.5–5.2)
Alkaline Phosphatase: 55 U/L (ref 39–117)
BUN: 15 mg/dL (ref 6–23)
CO2: 25 mEq/L (ref 19–32)
Calcium: 9.4 mg/dL (ref 8.4–10.5)
Chloride: 105 mEq/L (ref 96–112)
Creatinine, Ser: 0.94 mg/dL (ref 0.40–1.50)
GFR: 81.27 mL/min (ref >60.00)
Glucose, Bld: 149 mg/dL — ABNORMAL HIGH (ref 70–99)
Potassium: 4.2 mEq/L (ref 3.5–5.1)
Sodium: 139 mEq/L (ref 135–145)
Total Bilirubin: 0.6 mg/dL (ref 0.2–1.2)
Total Protein: 6.7 g/dL (ref 6.0–8.3)

## 2018-06-12 LAB — HEMOGLOBIN A1C: Hgb A1c MFr Bld: 6.5 % (ref 4.6–6.5)

## 2018-06-12 LAB — LIPID PANEL
Cholesterol: 160 mg/dL (ref 0–200)
HDL: 37.9 mg/dL — ABNORMAL LOW (ref >39.00)
LDL Cholesterol: 99 mg/dL (ref 0–99)
NonHDL: 122.11
Total CHOL/HDL Ratio: 4
Triglycerides: 115 mg/dL (ref 0.0–149.0)
VLDL: 23 mg/dL (ref 0.0–40.0)

## 2018-06-12 LAB — MICROALBUMIN / CREATININE URINE RATIO
Creatinine,U: 73.4 mg/dL
Microalb Creat Ratio: 1.6 mg/g (ref 0.0–30.0)
Microalb, Ur: 1.2 mg/dL (ref 0.0–1.9)

## 2018-06-23 ENCOUNTER — Encounter: Payer: Self-pay | Admitting: Family

## 2018-06-23 DIAGNOSIS — C61 Malignant neoplasm of prostate: Secondary | ICD-10-CM

## 2018-06-23 NOTE — Telephone Encounter (Signed)
Please contact pt to schedule lab visit for PSA.

## 2018-06-23 NOTE — Telephone Encounter (Signed)
lvm for patient to call back for lab appointment to get his PSA check.

## 2018-06-26 ENCOUNTER — Telehealth: Payer: Self-pay | Admitting: Family

## 2018-06-26 NOTE — Telephone Encounter (Signed)
Copied from [place] [id number]. Topic: Appointment Scheduling - Scheduling Inquiry for Clinic >> Jun 26, 2018  3:47 PM Gustavus Messing wrote: Reason for CRM: The patient has been waiting on a call from Dr. Inda Castle to schedule labs for him. Tried the practice 3 times. Please call back at [phone]

## 2018-06-27 NOTE — Telephone Encounter (Signed)
Lab appointment scheduled for tomorrow afternoon.

## 2018-06-28 ENCOUNTER — Other Ambulatory Visit: Payer: Self-pay

## 2018-06-28 ENCOUNTER — Other Ambulatory Visit (INDEPENDENT_AMBULATORY_CARE_PROVIDER_SITE_OTHER): Payer: 59

## 2018-06-28 DIAGNOSIS — C61 Malignant neoplasm of prostate: Secondary | ICD-10-CM | POA: Diagnosis not present

## 2018-06-28 LAB — PSA: PSA: 0.05 ng/mL — ABNORMAL LOW (ref 0.10–4.00)

## 2018-07-03 ENCOUNTER — Other Ambulatory Visit: Payer: Self-pay | Admitting: Cardiology

## 2018-07-03 ENCOUNTER — Encounter: Payer: Self-pay | Admitting: Family

## 2018-07-03 MED FILL — JANUVIA 100 MG TABLET: 100 | 30 days supply | Qty: 30 | Fill #2

## 2018-07-03 MED FILL — INVOKANA 300 MG TABLET: 300 | 30 days supply | Qty: 30 | Fill #2

## 2018-07-04 MED FILL — ASPIRIN EC 325 MG TABLET: 325 | 90 days supply | Qty: 90 | Fill #0

## 2018-07-05 DIAGNOSIS — Z96612 Presence of left artificial shoulder joint: Secondary | ICD-10-CM | POA: Diagnosis not present

## 2018-07-05 DIAGNOSIS — Z471 Aftercare following joint replacement surgery: Secondary | ICD-10-CM | POA: Diagnosis not present

## 2018-07-19 ENCOUNTER — Encounter: Payer: Self-pay | Admitting: Family

## 2018-07-20 ENCOUNTER — Ambulatory Visit: Payer: 59 | Admitting: Internal Medicine

## 2018-07-24 ENCOUNTER — Other Ambulatory Visit: Payer: Self-pay

## 2018-07-27 ENCOUNTER — Other Ambulatory Visit: Payer: Self-pay

## 2018-07-27 ENCOUNTER — Encounter: Payer: Self-pay | Admitting: Internal Medicine

## 2018-07-27 ENCOUNTER — Ambulatory Visit (INDEPENDENT_AMBULATORY_CARE_PROVIDER_SITE_OTHER): Payer: 59 | Admitting: Internal Medicine

## 2018-07-27 VITALS — BP 118/70 | HR 67 | Ht 68.0 in | Wt 187.0 lb

## 2018-07-27 DIAGNOSIS — E1165 Type 2 diabetes mellitus with hyperglycemia: Secondary | ICD-10-CM | POA: Diagnosis not present

## 2018-07-27 DIAGNOSIS — E663 Overweight: Secondary | ICD-10-CM | POA: Diagnosis not present

## 2018-07-27 DIAGNOSIS — E785 Hyperlipidemia, unspecified: Secondary | ICD-10-CM

## 2018-07-27 MED ORDER — CANAGLIFLOZIN 300 MG PO TABS: ORAL_TABLET | ORAL | 11 refills | Status: DC

## 2018-07-27 MED ORDER — METFORMIN HCL ER 500 MG PO TB24: ORAL_TABLET | ORAL | 3 refills | Status: DC

## 2018-07-27 NOTE — Patient Instructions (Addendum)
Please continue: - Metformin ER 1000 mg 2x daily with meals - Invokana 300 mg daily in am  Try to stop Januvia.  Please return in 6 months with your sugar log.

## 2018-07-27 NOTE — Progress Notes (Signed)
Patient ID: Larry Davis, male   DOB: 14-Jan-1956, 63 y.o.   MRN: [id number]   HPI: Larry Davis is a 63 y.o.-year-old male, returning for f/u for DM2, dx in ~2008, non-insulin-dependent, uncontrolled, without long term complications. Last visit  1 year and 3 months ago.  Last hemoglobin A1c was: Lab Results  Component Value Date   HGBA1C 6.5 06/12/2018   HGBA1C 6.4 05/04/2017   HGBA1C 7.1 12/24/2016   Pt is on a regimen of: - Metformin ER 1000 mg 2x daily with meals - Invokana 300 mg daily in am - Januvia 100 mg daily in am He was on Glipizide 5 mg before b'fast and dinner - not on this now.  Pt checks his sugars once a day: - am:  120, 145-150 >> 135-145 >> 110-130 - 2h after b'fast: n/c - before lunch:  145-150 >> 135-145 >> 110-130 - 2h after lunch: n/c >> up to 145-150, 170 (after pizza) - before dinner: n/c >> 150-160s >> 150-160 >> n/c - 2h after dinner: n/c >> 160 >> 130-140 - bedtime: n/c - nighttime: n/c Lowest sugar was 120 >> 101 >> ; he has hypoglycemia awareness at 100. Highest sugar was 185 (mexican foods) >> 190 >> 170.  Glucometer: AccuChek  Pt's meals are: 45-60 g carbs per meal, 50-60% vegetarian: - Breakfast: coffee + oatmeal or bagel, fruit - Lunch: salad + sandwich or leftovers from dinner  - Dinner: chicken and fish + vegetables + starch - Snacks:  He saw nutrition in the past.  No fast food. Diet sodas - not often.   He was walking 3 times a week 3 miles on the treadmill. Not now, during the coronavs pandemic, but stays active at home.  -No CKD, last BUN/creatinine:  Lab Results  Component Value Date   BUN 15 06/12/2018   BUN 11 01/10/2018   CREATININE 0.94 06/12/2018   CREATININE 1.16 01/10/2018   -+ HL; Last set of lipids: Lab Results  Component Value Date   CHOL 160 06/12/2018   HDL 37.90 (L) 06/12/2018   LDLCALC 99 06/12/2018   LDLDIRECT 142.0 11/13/2014   TRIG 115.0 06/12/2018   CHOLHDL 4 06/12/2018  On atorvastatin. - last  eye exam was in 02/2016: No DR ([place])  - no numbness and tingling in his feet.  ROS: Constitutional: no weight gain/+ weight loss, no fatigue, no subjective hyperthermia, no subjective hypothermia Eyes: no blurry vision, no xerophthalmia ENT: no sore throat, no nodules palpated in neck, no dysphagia, no odynophagia, no hoarseness Cardiovascular: no CP/no SOB/no palpitations/no leg swelling Respiratory: no cough/no SOB/no wheezing Gastrointestinal: no N/no V/no D/no C/no acid reflux Musculoskeletal: no muscle aches/+ joint aches Skin: no rashes, no hair loss Neurological: no tremors/no numbness/no tingling/no dizziness  I reviewed pt's medications, allergies, PMH, social hx, family hx, and changes were documented in the history of present illness. Otherwise, unchanged from my initial visit note.  Past Medical History:  Diagnosis Date  . Allergy   . 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 Diabetes mellitus without complication ([place])   . PONV (postoperative nausea and vomiting)   . Prostate cancer ([place]) 03/2014  . Urticaria    Past Surgical History:  Procedure Laterality Date  . ADENOIDECTOMY    . CHOLECYSTECTOMY  2000  . COLONOSCOPY    . LYMPHADENECTOMY Bilateral 06/14/2014   Procedure: PELVIC LYMPH NODE DISSECTION;  Surgeon: Alexis Frock, MD;  Location: [facility];  Service: Urology;  Laterality: Bilateral;  . REVERSE SHOULDER ARTHROPLASTY  Left 01/19/2018  . REVERSE SHOULDER ARTHROPLASTY Left 01/19/2018   Procedure: REVERSE SHOULDER ARTHROPLASTY;  Surgeon: Justice Britain, MD;  Location: [facility];  Service: Orthopedics;  Laterality: Left;  158min  Please move 7:30am Larry Davis to 10am  . ROBOT ASSISTED LAPAROSCOPIC RADICAL PROSTATECTOMY N/A 06/14/2014   Procedure: ROBOTIC ASSISTED LAPAROSCOPIC RADICAL PROSTATECTOMY WITH INDOCYANINE GREEN DYE INJECTION;  Surgeon: Alexis Frock, MD;  [facility];  Service: Urology;  Laterality: N/A;  . SHOULDER SURGERY Right 1990   arthroscopy  .  TONSILLECTOMY     Social History   Social History  . Marital status: Married    Spouse name: N/A   Social History Main Topics  . Smoking status: Former Research scientist (life sciences)  . Smokeless tobacco: Never Used  . Alcohol use No  . Drug use: No  . Sexual activity: Not on file   Other Topics Concern  . 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 aspirin EC 325 MG tablet TAKE 1 TABLET (325 MG TOTAL) BY MOUTH DAILY. 90 tablet 3  . Blood Glucose Monitoring Suppl (FREESTYLE LITE) DEVI Use as directed 1 each 0  . diclofenac sodium (VOLTAREN) 1 % GEL Apply 1 application topically daily.    Marland Kitchen EPINEPHrine 0.3 mg/0.3 mL IJ SOAJ injection Inject 0.3 mLs (0.3 mg total) into the muscle once for 1 dose. 2 Device 1  . glucose blood (FREESTYLE LITE) test strip Use to check blood sugars up to 3 times daily 300 each 6  . INVOKANA 300 MG TABS tablet TAKE 1 TABLET BY MOUTH DAILY BEFORE BREAKFAST. 30 tablet 2  . Lancets (FREESTYLE) lancets Use to check blood sugars up to 3 times daily 100 each 12  . meloxicam (MOBIC) 7.5 MG tablet Take 1 tablet (7.5 mg total) by mouth 2 (two) times daily. 60 tablet 0  . metFORMIN (GLUCOPHAGE-XR) 500 MG 24 hr tablet TAKE TWO TABLETS BY MOUTH 2 TIMES A DAY WITH MEALS 360 tablet 0  . sitaGLIPtin (JANUVIA) 100 MG tablet Take 1 tablet (100 mg total) by mouth daily. 90 tablet 0  . traZODone (DESYREL) 50 MG tablet Take 0.5-1 tablets (25-50 mg total) by mouth at bedtime as needed for sleep. 30 tablet 3   No current facility-administered medications on file prior to visit.    Allergies  Allergen Reactions  . Bee Venom Anaphylaxis and Other (See Comments)    Throat swells  .  Beef-Derived Products Anaphylaxis   Family History  Problem Relation Age of Onset  . Heart disease Mother   . Diabetes Mother   . Arthritis Mother   . Hypertension Mother   . Heart disease Father   . Hypertension Father   . Diabetes Father   . Arthritis Father   . Cancer Maternal Uncle        prostate  . Cancer Maternal Grandmother        cervical   PE: BP 118/70   Pulse 67   Ht 5\' 8"  (1.727 m)   Wt 187 lb (84.8 kg)   SpO2 97%   BMI 28.43 kg/m  Wt Readings from Last 3 Encounters:  07/27/18 187  lb (84.8 kg)  06/09/18 185 lb (83.9 kg)  01/19/18 189 lb (85.7 kg)   Constitutional: overweight, in NAD Eyes: PERRLA, EOMI, no exophthalmos ENT: moist mucous membranes, no thyromegaly, no cervical lymphadenopathy Cardiovascular: RRR, No MRG Respiratory: CTA B Gastrointestinal: abdomen soft, NT, ND, BS+ Musculoskeletal: no deformities, strength intact in all 4 Skin: moist, warm, no rashes Neurological: no tremor with outstretched hands, DTR normal in all 4  ASSESSMENT: 1. DM2, non-insulin-dependent, uncontrolled, without long term complications, but with hyperglycemia  2.  Overweight  3. HL  PLAN:  1. Patient with longstanding, uncontrolled, type 2 diabetes, on oral antidiabetic regimen, returning after longer absence.  At last visit we added back glipizide as his sugars are higher due to steroid injections in shoulders.  He had a recent HbA1c which was at goal, at 6.5%.  He continues normal his diet.  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  He tells me that his sugars remain controlled. -At this visit, sugars are almost all at goal.  He is interested in de-escalating his diabetes regimen and we discussed about stopping Januvia.  I advised him that if he states his sugar started to increase, he will need to add it back.  At this visit, we also discussed about the mechanism of action for each of his medicines and why I  suggested to stop Januvia and not the other 2 medications.  We discussed about other benefits of metformin and Invokana to include cardiovascular risk reduction and also, for Invokana, improvement in weight and kidney function. - I suggested to:  Patient Instructions  Please continue: - Metformin ER 1000 mg 2x daily with meals - Invokana 300 mg daily in am  Try to stop Januvia.  Please return in 6 months with your sugar log.    - continue checking sugars at different times of the day - check 1x a day, rotating checks - advised for yearly eye exams >> he is not UTD >> will have this in July, at the [place] [facility] - Return to clinic in 3 mo with sugar log    2.  Overweight -Continue SGLT 2 inhibitor which should also help -Also continue dietary changes and exercise -He lost approximately 9 pounds since last visit!  3. HL - Reviewed latest lipid panel from 05/2018: LDL at goal, HDL slightly low Lab Results  Component Value Date   CHOL 160 06/12/2018   HDL 37.90 (L) 06/12/2018   LDLCALC 99 06/12/2018   LDLDIRECT 142.0 11/13/2014   TRIG 115.0 06/12/2018   CHOLHDL 4 06/12/2018  - Continues atorvastatin without side effects.  Philemon Kingdom, MD PhD [facility] Endocrinology

## 2018-08-04 MED FILL — INVOKANA 300 MG TABLET: 300 | 30 days supply | Qty: 30 | Fill #0

## 2018-08-04 MED FILL — JANUVIA 100 MG TABLET: 100 | 30 days supply | Qty: 30 | Fill #0

## 2018-08-08 ENCOUNTER — Other Ambulatory Visit: Payer: Self-pay | Admitting: Internal Medicine

## 2018-08-08 MED FILL — FREESTYLE LITE TEST STRIP: 90 days supply | Qty: 300 | Fill #0

## 2018-08-25 ENCOUNTER — Telehealth: Payer: Self-pay | Admitting: Family

## 2018-08-25 NOTE — Telephone Encounter (Signed)
SWP pt regarding billing inquiry from [facility] video visit with Melissa on 06/09/18.  Explained to pt at the time we were not able to perform CPEs for pts with [facility] insurance via telehealth d/t insurance guidelines/regulations.  I explained to the pt Melissa did an OV with labs ordered.  I did let the pt know we could now schedule him a CPE since we are seeing pts in the office now, but he declined to schedule at time of call.  Pt stated he thought he should have been made aware at the time of the visit the appt was an OV instead of thinking all along it was a CPE.

## 2018-08-25 NOTE — Telephone Encounter (Signed)
This was not a wellness check up. It was a follow-up and billed that way. If the patient has a question about the way insurance paid (leaving a copay) he will need to call the insurance company.  ----- Message -----  From: Jiles Prows, CMA  Sent: 07/19/2018  2:43 PM EDT  To: [facility]  Subject: FW: Non-Urgent Medical Question           Co-pay question   ----- Message -----  From: Doylene Canning, CMA  Sent: 07/19/2018  1:46 PM EDT  To: Jiles Prows, CMA  Subject: FW: Non-Urgent Medical Question             ----- Message -----  From: Danella Sensing  Sent: 07/19/2018 12:10 PM EDT  To: [facility]  Subject: Non-Urgent Medical Question             Hello,  My last visit was a wellness check up and on line. Your office charged me $10 as a copay . I do belive that my checkup does not have a copay. Can you check on this for me and get back to [place].   Thanks

## 2018-08-28 DIAGNOSIS — H905 Unspecified sensorineural hearing loss: Secondary | ICD-10-CM | POA: Diagnosis not present

## 2018-09-01 ENCOUNTER — Encounter: Payer: Self-pay | Admitting: Family

## 2018-09-05 ENCOUNTER — Ambulatory Visit (INDEPENDENT_AMBULATORY_CARE_PROVIDER_SITE_OTHER): Payer: 59 | Admitting: Family

## 2018-09-05 ENCOUNTER — Other Ambulatory Visit: Payer: Self-pay

## 2018-09-05 ENCOUNTER — Encounter: Payer: Self-pay | Admitting: Family

## 2018-09-05 VITALS — BP 138/61 | HR 72 | Temp 98.6°F | Resp 16 | Ht 68.0 in | Wt 183.6 lb

## 2018-09-05 DIAGNOSIS — Z0000 Encounter for general adult medical examination without abnormal findings: Secondary | ICD-10-CM

## 2018-09-05 DIAGNOSIS — Z23 Encounter for immunization: Secondary | ICD-10-CM

## 2018-09-05 DIAGNOSIS — E118 Type 2 diabetes mellitus with unspecified complications: Secondary | ICD-10-CM

## 2018-09-05 MED ORDER — TRAZODONE HCL 50 MG PO TABS: ORAL_TABLET | ORAL | 2 refills | Status: DC

## 2018-09-05 MED FILL — traZODone HCL 50 MG TABS: 50 | 30 days supply | Qty: 30 | Fill #0

## 2018-09-05 NOTE — Patient Instructions (Signed)
Please continue to work on healthy diet and regular exercise.

## 2018-09-05 NOTE — Progress Notes (Signed)
Subjective:    Patient ID: Larry Davis, male    DOB: [birth year], 63 y.o.   MRN: [id number]  HPI  Patient is a 63 yr old male who presents today for routine follow up and cpx.  Patient presents today for complete physical.  Immunizations:  Shingrix, Tdap up to date, due for pneumovax booster pneumovax Diet:  healthy Exercise: 10000 steps a day Colonoscopy: 02/16/12- due for 10 year follow up in 2024 PSA:  Lab Results  Component Value Date   PSA 0.05 (L) 06/28/2018    DM2- following with endo. Pneumovax 23 toay.   Lab Results  Component Value Date   HGBA1C 6.5 06/12/2018   HGBA1C 6.4 05/04/2017   HGBA1C 7.1 12/24/2016   Lab Results  Component Value Date   MICROALBUR 1.2 06/12/2018   LDLCALC 99 06/12/2018   CREATININE 0.94 06/12/2018   Declines statin.    Review of Systems  Constitutional: Negative for unexpected weight change.  HENT: Negative for rhinorrhea.        Wears hearing aids  Eyes: Negative for visual disturbance.  Respiratory: Negative for cough.   Cardiovascular: Negative for leg swelling.  Gastrointestinal: Negative for blood in stool, constipation and diarrhea (chronic due to metformin).  Genitourinary: Negative for difficulty urinating, dysuria and frequency.  Musculoskeletal: Negative for arthralgias and myalgias.  Skin: Negative for rash.  Neurological: Negative for headaches.  Hematological: Negative for adenopathy.  Psychiatric/Behavioral:       Denies depression/anxiety    Past Medical History:  Diagnosis Date  . Allergy   . 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 Diabetes mellitus without complication ([place])   . PONV (postoperative nausea and vomiting)   . Prostate cancer ([place]) 03/2014  . Urticaria      Social History   Socioeconomic History  . Marital status: Married    Spouse name: Not on file  . Number of children: Not on file  . Years of education: Not on file  . Highest education level: Not on file  Occupational  History  . Not on file  Social Needs  . Financial resource strain: Not on file  . Food insecurity    Worry: Not on file    Inability: Not on file  . Transportation needs    Medical: Not on file    Non-medical: Not on file  Tobacco Use  . Smoking status: Former Smoker    Packs/day: 0.50    Years: 10.00    Pack years: 5.00    Types: Cigarettes    Quit date: 02/15/1978    Years since quitting: 40.5  . Smokeless tobacco: Never Used  Substance and Sexual Activity  . Alcohol use: No    Alcohol/week: 0.0 standard drinks  . Drug use: No  . Sexual activity: Not on file  Lifestyle  . Physical activity    Days per week: Not on file    Minutes per session: Not on file  . Stress: Not on file  Relationships  . Social Herbalist on phone: Not on file    Gets together: Not on file    Attends religious service: Not on file    Active member of club or organization: Not on file    Attends meetings of clubs or organizations: Not on file    Relationship status: Not on file  . Intimate partner violence    Fear of current or ex partner: Not on file    Emotionally abused: Not on file  Physically abused: Not on file    Forced sexual activity: Not on file  Other Topics Concern  . Not on file  Social History Narrative   2 children Son and daughter 3 biological grandchildren- live in [place]   Wife has 4 children   Married   Semi- retired since 11/18   Has masters degree   Enjoys [place]     Past Surgical History:  Procedure Laterality Date  . ADENOIDECTOMY    . CHOLECYSTECTOMY  2000  . COLONOSCOPY    . LYMPHADENECTOMY Bilateral 06/14/2014   Procedure: PELVIC LYMPH NODE DISSECTION;  Surgeon: Alexis Frock, MD;  [facility];  Service: Urology;  Laterality: Bilateral;  . REVERSE SHOULDER ARTHROPLASTY Left 01/19/2018  . REVERSE SHOULDER ARTHROPLASTY Left 01/19/2018   Procedure: REVERSE SHOULDER ARTHROPLASTY;  Surgeon: Justice Britain, MD;  Location: [facility];   Service: Orthopedics;  Laterality: Left;  173min  Please move 7:30am R[place]ie to 10am  . ROBOT ASSISTED LAPAROSCOPIC RADICAL PROSTATECTOMY N/A 06/14/2014   Procedure: ROBOTIC ASSISTED LAPAROSCOPIC RADICAL PROSTATECTOMY WITH INDOCYANINE GREEN DYE INJECTION;  Surgeon: Alexis Frock, MD;  [facility];  Service: Urology;  Laterality: N/A;  . SHOULDER SURGERY Right 1990   arthroscopy  . TONSILLECTOMY      Family History  Problem Relation Age of Onset  . Heart disease Mother   . Diabetes Mother   . Arthritis Mother   . Hypertension Mother   . Heart disease Father   . Hypertension Father   . Diabetes Father   . Arthritis Father   . Cancer Maternal Uncle        prostate  . Cancer Maternal Grandmother        cervical    Allergies  Allergen Reactions  . Bee Venom Anaphylaxis and Other (See Comments)    Throat swells  . Beef-Derived Products Anaphylaxis    Current Outpatient Medications on File Prior to Visit  Medication Sig Dispense Refill  . aspirin EC 325 MG tablet TAKE 1 TABLET (325 MG TOTAL) BY MOUTH DAILY. 90 tablet 3  . Blood Glucose Monitoring Suppl (FREESTYLE LITE) DEVI Use as directed 1 each 0  . canagliflozin (INVOKANA) 300 MG TABS tablet TAKE 1 TABLET BY MOUTH DAILY BEFORE BREAKFAST. 30 tablet 11  . glucose blood (FREESTYLE LITE) test strip USE AS DIRECTED TO CHECK BLOOD SUGAR UP TO 3 TIMES DAILY 300 strip 6  . Lancets (FREESTYLE) lancets Use to check blood sugars up to 3 times daily 100 each 12  . metFORMIN (GLUCOPHAGE-XR) 500 MG 24 hr tablet TAKE TWO TABLETS BY MOUTH 2 TIMES A DAY WITH MEALS 360 tablet 3  . traZODone (DESYREL) 50 MG tablet trazodone 50 mg tablet    . EPINEPHrine 0.3 mg/0.3 mL IJ SOAJ injection Inject 0.3 mLs (0.3 mg total) into the muscle once for 1 dose. 2 Device 1   No current facility-administered medications on file prior to visit.     BP 138/61 (BP Location: Right Arm, Patient Position: Sitting, Cuff Size: Small)   Pulse 72   Temp 98.6 F  (37 C) (Oral)   Resp 16   Ht 5\' 8"  (1.727 m)   Wt 183 lb 9.6 oz (83.3 kg)   SpO2 98%   BMI 27.92 kg/m       Objective:   Physical Exam  Physical Exam  Constitutional: He is oriented to person, place, and time. He appears well-developed and well-nourished. No distress.  HENT:  Head: Normocephalic and atraumatic.  Right Ear:  Tympanic membrane and ear canal normal.  Left Ear: Tympanic membrane and ear canal normal.  Mouth/Throat: Oropharynx is clear and moist.  Eyes: Pupils are equal, round, and reactive to light. No scleral icterus.  Neck: Normal range of motion. No thyromegaly present.  Cardiovascular: Normal rate and regular rhythm.   No murmur heard. Pulmonary/Chest: Effort normal and breath sounds normal. No respiratory distress. He has no wheezes. He has no rales. He exhibits no tenderness.  Abdominal: Soft. Bowel sounds are normal. He exhibits no distension and no mass. There is no tenderness. There is no rebound and no guarding.  Musculoskeletal: He exhibits no edema.  Lymphadenopathy:    He has no cervical adenopathy.  Neurological: He is alert and oriented to person, place, and time. He has normal patellar reflexes. He exhibits normal muscle tone. Coordination normal.  Skin: Skin is warm and dry.  Psychiatric: He has a normal mood and affect. His behavior is normal. Judgment and thought content normal.           Assessment & Plan:  Preventative care- needs pneumovax booster, flu shot this fall. Lab work up to date. Encouraged pt to continue healthy diet and regular exercise.        Assessment & Plan:

## 2018-09-06 MED FILL — METFORMIN HCL ER 500 MG TB2: 500 | 90 days supply | Qty: 360 | Fill #0

## 2018-09-06 MED FILL — INVOKANA 300 MG TABLET: 300 | 30 days supply | Qty: 30 | Fill #1

## 2018-10-10 MED FILL — INVOKANA 300 MG TABLET: 300 | 30 days supply | Qty: 30 | Fill #2

## 2018-10-30 MED FILL — AMOXICILLIN 500 MG CAPSULE: 500 | 5 days supply | Qty: 20 | Fill #0

## 2018-11-20 DIAGNOSIS — N393 Stress incontinence (female) (male): Secondary | ICD-10-CM | POA: Diagnosis not present

## 2018-11-20 DIAGNOSIS — C61 Malignant neoplasm of prostate: Secondary | ICD-10-CM | POA: Diagnosis not present

## 2018-11-20 DIAGNOSIS — N5201 Erectile dysfunction due to arterial insufficiency: Secondary | ICD-10-CM | POA: Diagnosis not present

## 2018-11-20 DIAGNOSIS — E291 Testicular hypofunction: Secondary | ICD-10-CM | POA: Diagnosis not present

## 2018-11-20 MED FILL — INVOKANA 300 MG TABLET: 300 | 30 days supply | Qty: 30 | Fill #3

## 2018-11-20 MED FILL — traZODone HCL 50 MG TABS: 50 | 30 days supply | Qty: 30 | Fill #1

## 2018-12-18 MED FILL — traMADol HCL 50 MG TABS: 50 | 30 days supply | Qty: 30 | Fill #0

## 2018-12-18 MED FILL — MELOXICAM 15 MG TABLET: 15 | 30 days supply | Qty: 30 | Fill #0

## 2018-12-18 MED FILL — INVOKANA 300 MG TABLET: 300 | 30 days supply | Qty: 30 | Fill #4

## 2018-12-18 MED FILL — metFORMIN HCL ER 500 MG TB2: 500 | 90 days supply | Qty: 360 | Fill #1

## 2019-01-18 MED FILL — INVOKANA 300 MG TABLET: 300 | 30 days supply | Qty: 30 | Fill #5

## 2019-01-26 ENCOUNTER — Ambulatory Visit: Payer: 59 | Admitting: Internal Medicine

## 2019-02-26 MED FILL — INVOKANA 300 MG TABLET: 300 | 30 days supply | Qty: 30 | Fill #6

## 2019-02-26 MED FILL — traZODone HCL 50 MG TABS: 50 | 30 days supply | Qty: 30 | Fill #2

## 2019-03-13 ENCOUNTER — Encounter: Payer: Self-pay | Admitting: Family

## 2019-03-13 ENCOUNTER — Encounter: Payer: Self-pay | Admitting: Internal Medicine

## 2019-03-18 ENCOUNTER — Encounter: Payer: Self-pay | Admitting: Family

## 2019-03-20 ENCOUNTER — Other Ambulatory Visit: Payer: Self-pay

## 2019-03-20 ENCOUNTER — Ambulatory Visit: Payer: 59 | Admitting: Family

## 2019-03-20 VITALS — BP 135/65 | HR 64 | Temp 96.2°F | Resp 16 | Ht 68.0 in | Wt 191.0 lb

## 2019-03-20 DIAGNOSIS — T7800XA Anaphylactic reaction due to unspecified food, initial encounter: Secondary | ICD-10-CM | POA: Diagnosis not present

## 2019-03-20 DIAGNOSIS — F5101 Primary insomnia: Secondary | ICD-10-CM

## 2019-03-20 DIAGNOSIS — C61 Malignant neoplasm of prostate: Secondary | ICD-10-CM | POA: Diagnosis not present

## 2019-03-20 DIAGNOSIS — E1165 Type 2 diabetes mellitus with hyperglycemia: Secondary | ICD-10-CM | POA: Diagnosis not present

## 2019-03-20 DIAGNOSIS — E785 Hyperlipidemia, unspecified: Secondary | ICD-10-CM

## 2019-03-20 MED ORDER — EPINEPHRINE 0.3 MG/0.3ML IJ SOAJ: 0.3000 mg | Freq: Once | INTRAMUSCULAR | 1 refills | Status: DC

## 2019-03-20 MED ORDER — ZOLPIDEM TARTRATE 5 MG PO TABS: 5.0000 mg | ORAL_TABLET | Freq: Every evening | ORAL | 0 refills | Status: DC | PRN

## 2019-03-20 MED FILL — ZOLPIDEM TARTRATE 5 MG TAB: 5 | 30 days supply | Qty: 30 | Fill #0

## 2019-03-20 MED FILL — EPINEPHRINE 0.3 MG AUTO-INJ: 0.3 | 2 days supply | Qty: 2 | Fill #0

## 2019-03-20 NOTE — Patient Instructions (Addendum)
Below are two ways to schedule a Covid-19 Vaccine:  Please visit [email or web address] to register or call [phone]  Or call:  [place] Covid-19 vaccine scheduling at [phone]  

## 2019-03-20 NOTE — Progress Notes (Signed)
Subjective:    Patient ID: Larry Davis, male    DOB: [birth year], 64 y.o.   MRN: [id number]  HPI  Patient is a 64 yr old male who presents today for follow up  DM2- maintained on metformin and invokana. Sugars range 125-140.  Lab Results  Component Value Date   HGBA1C 6.5 06/12/2018   HGBA1C 6.4 05/04/2017   HGBA1C 7.1 12/24/2016   Lab Results  Component Value Date   MICROALBUR 1.2 06/12/2018   Staley 99 06/12/2018   CREATININE 0.94 06/12/2018   Hx of prostate cancer- Followed by Dr. Tresa Moore.   Insomnia- Reports that trazodone is not working and he wants to go back on [place].    Review of Systems See HPI  Past Medical History:  Diagnosis Date  . Allergy   . 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 Diabetes mellitus without complication ([place])   . PONV (postoperative nausea and vomiting)   . Prostate cancer ([place]) 03/2014  . Urticaria      Social History   Socioeconomic History  . Marital status: Married    Spouse name: Not on file  . Number of children: Not on file  . Years of education: Not on file  . Highest education level: Not on file  Occupational History  . Not on file  Tobacco Use  . Smoking status: Former Smoker    Packs/day: 0.50    Years: 10.00    Pack years: 5.00    Types: Cigarettes    Quit date: 02/15/1978    Years since quitting: 41.1  . Smokeless tobacco: Never Used  Substance and Sexual Activity  . Alcohol use: No    Alcohol/week: 0.0 standard drinks  . Drug use: No  . Sexual activity: Not on file  Other Topics Concern  . Not on file  Social History Narrative   2 children Son and daughter 3 biological grandchildren- live in [place]   Wife has 4 children   Married   Futures trader- retired since 11/18   Has masters degree   Enjoys Motorcycling    Social Determinants of Radio broadcast assistant Strain:   . Difficulty of Paying Living Expenses: Not on file  Food Insecurity:   . Worried About Charity fundraiser in the  Last Year: Not on file  . Ran Out of Food in the Last Year: Not on file  Transportation Needs:   . Lack of Transportation (Medical): Not on file  . Lack of Transportation (Non-Medical): Not on file  Physical Activity:   . Days of Exercise per Week: Not on file  . Minutes of Exercise per Session: Not on file  Stress:   . Feeling of Stress : Not on file  Social Connections:   . Frequency of Communication with Friends and Family: Not on file  . Frequency of Social Gatherings with Friends and Family: Not on file  . Attends Religious Services: Not on file  . Active Member of Clubs or Organizations: Not on file  . Attends Archivist Meetings: Not on file  . Marital Status: Not on file  Intimate Partner Violence:   . Fear of Current or Ex-Partner: Not on file  . Emotionally Abused: Not on file  . Physically Abused: Not on file  . Sexually Abused: Not on file    Past Surgical History:  Procedure Laterality Date  . ADENOIDECTOMY    . CHOLECYSTECTOMY  2000  . COLONOSCOPY    . LYMPHADENECTOMY Bilateral 06/14/2014  Procedure: PELVIC LYMPH NODE DISSECTION;  Surgeon: Alexis Frock, MD;  Location: [facility];  Service: Urology;  Laterality: Bilateral;  . REVERSE SHOULDER ARTHROPLASTY Left 01/19/2018  . REVERSE SHOULDER ARTHROPLASTY Left 01/19/2018   Procedure: REVERSE SHOULDER ARTHROPLASTY;  Surgeon: Justice Britain, MD;  Location: [facility];  Service: Orthopedics;  Laterality: Left;  168min  Please move 7:30am [place] to 10am  . ROBOT ASSISTED LAPAROSCOPIC RADICAL PROSTATECTOMY N/A 06/14/2014   Procedure: ROBOTIC ASSISTED LAPAROSCOPIC RADICAL PROSTATECTOMY WITH INDOCYANINE GREEN DYE INJECTION;  Surgeon: Alexis Frock, MD;  Location: [facility];  Service: Urology;  Laterality: N/A;  . SHOULDER SURGERY Right 1990   arthroscopy  . TONSILLECTOMY      Family History  Problem Relation Age of Onset  . Heart disease Mother   . Diabetes Mother   . Arthritis Mother   . Hypertension Mother   .  Heart disease Father   . Hypertension Father   . Diabetes Father   . Arthritis Father   . Cancer Maternal Uncle        prostate  . Cancer Maternal Grandmother        cervical    Allergies  Allergen Reactions  . Bee Venom Anaphylaxis and Other (See Comments)    Throat swells  . Beef-Derived Products Anaphylaxis    Current Outpatient Medications on File Prior to Visit  Medication Sig Dispense Refill  . aspirin EC 325 MG tablet TAKE 1 TABLET (325 MG TOTAL) BY MOUTH DAILY. 90 tablet 3  . Blood Glucose Monitoring Suppl (FREESTYLE LITE) DEVI Use as directed 1 each 0  . canagliflozin (INVOKANA) 300 MG TABS tablet TAKE 1 TABLET BY MOUTH DAILY BEFORE BREAKFAST. 30 tablet 11  . glucose blood (FREESTYLE LITE) test strip USE AS DIRECTED TO CHECK BLOOD SUGAR UP TO 3 TIMES DAILY 300 strip 6  . Lancets (FREESTYLE) lancets Use to check blood sugars up to 3 times daily 100 each 12  . metFORMIN (GLUCOPHAGE-XR) 500 MG 24 hr tablet TAKE TWO TABLETS BY MOUTH 2 TIMES A DAY WITH MEALS 360 tablet 3   No current facility-administered medications on file prior to visit.    BP 135/65 (BP Location: Right Arm, Patient Position: Sitting, Cuff Size: Small)   Pulse 64   Temp (!) 96.2 F (35.7 C) (Temporal)   Resp 16   Ht 5\' 8"  (1.727 m)   Wt 191 lb (86.6 kg)   SpO2 98%   BMI 29.04 kg/m       Objective:   Physical Exam Constitutional:      General: He is not in acute distress.    Appearance: He is well-developed.  HENT:     Head: Normocephalic and atraumatic.  Cardiovascular:     Rate and Rhythm: Normal rate and regular rhythm.     Heart sounds: No murmur.  Pulmonary:     Effort: Pulmonary effort is normal. No respiratory distress.     Breath sounds: Normal breath sounds. No wheezing or rales.  Skin:    General: Skin is warm and dry.  Neurological:     Mental Status: He is alert and oriented to person, place, and time.  Psychiatric:        Behavior: Behavior normal.        Thought  Content: Thought content normal.           Assessment & Plan:  Alpha Gal- pt requesting follow up testing.  Testing ordered.  DM2- clinically stable on current medications.  Management per Endo. Eye  exam due- advised pt to schedule follow up exam.   Insomnia- uncontrolled. D/c trazodone, rx ambien.  Controlled substance contract is signed and a UDS is performed.  Hyperlipidemia- declines statin. LDL nearly at goal.  Lab Results  Component Value Date   CHOL 164 03/20/2019   HDL 36.40 (L) 03/20/2019   LDLCALC 99 06/12/2018   LDLDIRECT 102.0 03/20/2019   TRIG 236.0 (H) 03/20/2019   CHOLHDL 5 03/20/2019     Hx of Prostate CA- followed by Urology, Dr. Tresa Moore. Pt requesting follow up psa today. Will forward PSA to urology.  Lab Results  Component Value Date   PSA 0.13 03/20/2019   PSA 0.05 (L) 06/28/2018   40 minutes spent on today's visit.

## 2019-03-21 LAB — LIPID PANEL
Cholesterol: 164 mg/dL (ref 0–200)
HDL: 36.4 mg/dL — ABNORMAL LOW (ref >39.00)
NonHDL: 127.6
Total CHOL/HDL Ratio: 5
Triglycerides: 236 mg/dL — ABNORMAL HIGH (ref 0.0–149.0)
VLDL: 47.2 mg/dL — ABNORMAL HIGH (ref 0.0–40.0)

## 2019-03-21 LAB — COMPREHENSIVE METABOLIC PANEL
ALT: 20 U/L (ref 0–53)
AST: 15 U/L (ref 0–37)
Albumin: 4.1 g/dL (ref 3.5–5.2)
Alkaline Phosphatase: 57 U/L (ref 39–117)
BUN: 12 mg/dL (ref 6–23)
CO2: 27 mEq/L (ref 19–32)
Calcium: 9.2 mg/dL (ref 8.4–10.5)
Chloride: 103 mEq/L (ref 96–112)
Creatinine, Ser: 1.02 mg/dL (ref 0.40–1.50)
GFR: 73.77 mL/min (ref >60.00)
Glucose, Bld: 198 mg/dL — ABNORMAL HIGH (ref 70–99)
Potassium: 3.7 mEq/L (ref 3.5–5.1)
Sodium: 139 mEq/L (ref 135–145)
Total Bilirubin: 0.4 mg/dL (ref 0.2–1.2)
Total Protein: 6.4 g/dL (ref 6.0–8.3)

## 2019-03-21 LAB — PAIN MGMT, PROFILE 8 W/CONF, U
6 Acetylmorphine: NEGATIVE ng/mL
Alcohol Metabolites: NEGATIVE ng/mL (ref <500)
Amphetamines: NEGATIVE ng/mL
Benzodiazepines: NEGATIVE ng/mL
Buprenorphine, Urine: NEGATIVE ng/mL
Cocaine Metabolite: NEGATIVE ng/mL
Creatinine: 53.3 mg/dL
MDMA: NEGATIVE ng/mL
Marijuana Metabolite: NEGATIVE ng/mL
Opiates: NEGATIVE ng/mL
Oxidant: NEGATIVE ug/mL
Oxycodone: NEGATIVE ng/mL
pH: 6.6 (ref 4.5–9.0)

## 2019-03-21 LAB — LDL CHOLESTEROL, DIRECT: Direct LDL: 102 mg/dL

## 2019-03-21 LAB — HEMOGLOBIN A1C: Hgb A1c MFr Bld: 7.2 % — ABNORMAL HIGH (ref 4.6–6.5)

## 2019-03-21 LAB — PSA: PSA: 0.13 ng/mL (ref 0.10–4.00)

## 2019-03-22 ENCOUNTER — Encounter: Payer: Self-pay | Admitting: Family

## 2019-03-23 LAB — ALPHA-GAL PANEL
Beef IgE: 3.38 kU/L — ABNORMAL HIGH (ref <0.35)
Class: 2
Class: 2
Galactose-alpha-1,3-galactose IgE: 11.2 kU/L — ABNORMAL HIGH (ref <0.10)
LAMB/MUTTON IGE: 0.26 kU/L (ref <0.35)
Pork IgE: 1.02 kU/L — ABNORMAL HIGH (ref <0.35)

## 2019-03-26 ENCOUNTER — Telehealth: Payer: Self-pay | Admitting: Family

## 2019-03-26 NOTE — Telephone Encounter (Signed)
Please advise pt that his PSA is up slightly. Since he has had previous prostatectomy, I would like him to schedule follow up with his Urologist for this.  In addition, sugar and triglycerides are up- please work on diabetic diet.    Alpha-Gal testing shows that he is still allergic to mammalian meats.

## 2019-03-27 MED FILL — metFORMIN HCL ER 500 MG TB2: 500 | 90 days supply | Qty: 360 | Fill #2

## 2019-03-27 MED FILL — INVOKANA 300 MG TABLET: 300 | 30 days supply | Qty: 30 | Fill #7

## 2019-03-27 NOTE — Telephone Encounter (Signed)
Results and provider's advise given to patient yesterday, he verbalized understanding. Will call his urologist Dr Tresa Moore for appointment. PSA results faxed to [facility].

## 2019-04-24 ENCOUNTER — Encounter: Payer: Self-pay | Admitting: Internal Medicine

## 2019-04-24 ENCOUNTER — Other Ambulatory Visit: Payer: Self-pay

## 2019-04-24 ENCOUNTER — Ambulatory Visit (INDEPENDENT_AMBULATORY_CARE_PROVIDER_SITE_OTHER): Payer: 59 | Admitting: Internal Medicine

## 2019-04-24 ENCOUNTER — Other Ambulatory Visit: Payer: Self-pay | Admitting: Internal Medicine

## 2019-04-24 DIAGNOSIS — E1165 Type 2 diabetes mellitus with hyperglycemia: Secondary | ICD-10-CM | POA: Diagnosis not present

## 2019-04-24 DIAGNOSIS — E663 Overweight: Secondary | ICD-10-CM

## 2019-04-24 DIAGNOSIS — E785 Hyperlipidemia, unspecified: Secondary | ICD-10-CM

## 2019-04-24 MED ORDER — OZEMPIC (0.25 OR 0.5 MG/DOSE) 2 MG/1.5ML ~~LOC~~ SOPN: 0.5000 mg | PEN_INJECTOR | SUBCUTANEOUS | 5 refills | Status: DC

## 2019-04-24 MED ORDER — METFORMIN HCL ER 500 MG PO TB24: ORAL_TABLET | ORAL | 3 refills | Status: DC

## 2019-04-24 MED FILL — OZEMPIC 0.25 OR 0.5 MG/DOSE: 2 | 56 days supply | Qty: 3 | Fill #0

## 2019-04-24 NOTE — Patient Instructions (Addendum)
Please continue: - Invokana 300 mg daily in am  Please decrease: - Metformin ER 500 mg 2x daily with meals  Please start: - Ozempic 0.25 mg weekly in a.m. (for example on Sunday morning) x 4 weeks, then increase to 0.5 mg weekly in a.m. if no nausea or hypoglycemia.  Please return in 4 months with your sugar log.

## 2019-04-24 NOTE — Progress Notes (Signed)
Patient ID: BLEASE ZEMAITIS, male   DOB: [birth year], 64 y.o.   MRN: [id number]   Patient location: Home My location: Office Persons participating in the virtual visit: patient, provider  Referring Provider: Debbrah Alar, NP  I connected with the patient on 04/24/19 at  2:20 PM EST by a video enabled telemedicine application and verified that I am speaking with the correct person.   I discussed the limitations of evaluation and management by telemedicine and the availability of in person appointments. The patient expressed understanding and agreed to proceed.   Details of the encounter are shown below.  HPI: JEANNETTE PALLADINO is a 64 y.o.-year-old male, returning for f/u for DM2, dx in ~2008, non-insulin-dependent, uncontrolled, without long term complications. Last visit 9 months ago.  Reviewed his HbA1c levels: Lab Results  Component Value Date   HGBA1C 7.2 (H) 03/20/2019   HGBA1C 6.5 06/12/2018   HGBA1C 6.4 05/04/2017   Pt is on a regimen of: - Metformin ER 1000 mg 2x daily with meals >> still diarrhea - Invokana 300 mg daily in am We stopped Januvia 07/2018. He was on Glipizide 5 mg before b'fast and dinner - not on this now.  Pt checks his sugars once a day: - am:  120, 145-150 >> 135-145 >> 110-130 >> 120-135 - 2h after b'fast: n/c - before lunch:  145-150 >> 135-145 >> 110-130 >> 120-135 - 2h after lunch: n/c >> up to 145-150, 170 (after pizza) >> 160-180 - before dinner: n/c >> 150-160s >> 150-160 >> n/c - 2h after dinner: n/c >> 160 >> 130-140 >> 150-170 - bedtime: n/c - nighttime: n/c Lowest sugar was 120 >> 101 >> 112; he has hypoglycemia awareness at 100. Highest sugar was 185 (mexican foods) >> 190 >> 170 >> 180.  Glucometer: AccuChek  Pt's meals are: 45-60 g carbs per meal, 50-60% vegetarian: - Breakfast: coffee + oatmeal or bagel, fruit - Lunch: salad + sandwich or leftovers from dinner  - Dinner: chicken and fish + vegetables + starch - Snacks:  He  saw nutrition in the past.  No fast food. Diet sodas - not often.  He has alpha Gal mutation.  He was walking 3 times a week 3 miles on the treadmill.  During the coronavirus pandemic he was not going to the gym >> less exercise now. He plans to return to the gym. Now walking.  -No CKD, last BUN/creatinine:  Lab Results  Component Value Date   BUN 12 03/20/2019   BUN 15 06/12/2018   CREATININE 1.02 03/20/2019   CREATININE 0.94 06/12/2018   -+ HL; Last set of lipids: Lab Results  Component Value Date   CHOL 164 03/20/2019   HDL 36.40 (L) 03/20/2019   LDLCALC 99 06/12/2018   LDLDIRECT 102.0 03/20/2019   TRIG 236.0 (H) 03/20/2019   CHOLHDL 5 03/20/2019  On atorvastatin. - last eye exam was in 2019: [place] ([facility]) -No numbness and tingling in his feet.  No h/o pancreatitis or FH of MTC.  ROS: Constitutional: + weight gain/no weight loss, no fatigue, no subjective hyperthermia, no subjective hypothermia Eyes: no blurry vision, no xerophthalmia ENT: no sore throat, no nodules palpated in neck, no dysphagia, no odynophagia, no hoarseness Cardiovascular: no CP/no SOB/no palpitations/no leg swelling Respiratory: no cough/no SOB/no wheezing Gastrointestinal: no N/no V/no D/no C/no acid reflux Musculoskeletal: no muscle aches/no joint aches Skin: no rashes, no hair loss Neurological: no tremors/no numbness/no tingling/no dizziness  I reviewed pt's medications, allergies, PMH, social  hx, family hx, and changes were documented in the history of present illness. Otherwise, unchanged from my initial visit note.  Past Medical History:  Diagnosis Date  . Allergy   . 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 Diabetes mellitus without complication ([place])   . PONV (postoperative nausea and vomiting)   . Prostate cancer ([place]) 03/2014  . Urticaria    Past Surgical History:  Procedure Laterality Date  . ADENOIDECTOMY    . CHOLECYSTECTOMY  2000  . COLONOSCOPY    . LYMPHADENECTOMY  Bilateral 06/14/2014   Procedure: PELVIC LYMPH NODE DISSECTION;  Surgeon: Alexis Frock, MD;  [facility];  Service: Urology;  Laterality: Bilateral;  . REVERSE SHOULDER ARTHROPLASTY Left 01/19/2018  . REVERSE SHOULDER ARTHROPLASTY Left 01/19/2018   Procedure: REVERSE SHOULDER ARTHROPLASTY;  Surgeon: Justice Britain, MD;  Location: [facility];  Service: Orthopedics;  Laterality: Left;  149min  Please move 7:30am [place] to 10am  . ROBOT ASSISTED LAPAROSCOPIC RADICAL PROSTATECTOMY N/A 06/14/2014   Procedure: ROBOTIC ASSISTED LAPAROSCOPIC RADICAL PROSTATECTOMY WITH INDOCYANINE GREEN DYE INJECTION;  Surgeon: Alexis Frock, MD;  [facility];  Service: Urology;  Laterality: N/A;  . SHOULDER SURGERY Right 1990   arthroscopy  . TONSILLECTOMY     Social History   Social History  . Marital status: Married    Spouse name: N/A   Social History Main Topics  . Smoking status: Former Research scientist (life sciences)  . Smokeless tobacco: Never Used  . Alcohol use No  . Drug use: No  . Sexual activity: Not on file   Other Topics Concern  . 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 aspirin EC 325 MG tablet TAKE 1 TABLET (325 MG TOTAL) BY MOUTH DAILY. 90 tablet 3  . Blood Glucose Monitoring Suppl (FREESTYLE LITE) DEVI Use as directed 1 each 0  . canagliflozin (INVOKANA) 300 MG TABS tablet TAKE 1 TABLET BY MOUTH DAILY BEFORE BREAKFAST. 30 tablet 11  . EPINEPHrine 0.3 mg/0.3 mL IJ SOAJ injection Inject 0.3 mLs (0.3 mg total) into the muscle once for 1 dose. 0.6 mL 1  . glucose blood (FREESTYLE LITE) test strip USE AS DIRECTED TO CHECK BLOOD SUGAR UP TO 3 TIMES DAILY 300 strip 6  . Lancets (FREESTYLE)  lancets Use to check blood sugars up to 3 times daily 100 each 12  . metFORMIN (GLUCOPHAGE-XR) 500 MG 24 hr tablet TAKE TWO TABLETS BY MOUTH 2 TIMES A DAY WITH MEALS 360 tablet 3  . zolpidem (AMBIEN) 5 MG tablet Take 1 tablet (5 mg total) by mouth at bedtime as needed for sleep. 30 tablet 0   No current facility-administered medications on file prior to visit.   Allergies  Allergen Reactions  . Bee Venom Anaphylaxis and Other (See Comments)    Throat swells  . Beef-Derived Products Anaphylaxis   Family History  Problem Relation Age of Onset  . Heart disease Mother   . Diabetes Mother   . Arthritis Mother   . Hypertension Mother   . Heart disease Father   . Hypertension Father   . Diabetes Father   . Arthritis Father   . Cancer  Maternal Uncle        prostate  . Cancer Maternal Grandmother        cervical   PE: There were no vitals taken for this visit. Wt Readings from Last 3 Encounters:  03/20/19 191 lb (86.6 kg)  09/05/18 183 lb 9.6 oz (83.3 kg)  07/27/18 187 lb (84.8 kg)   Constitutional:  in NAD  The physical exam was not performed (virtual visit).  ASSESSMENT: 1. DM2, non-insulin-dependent, uncontrolled, without long term complications, but with hyperglycemia  2.  Overweight  3. HL  PLAN:  1. Patient with longstanding, uncontrolled, type 2 diabetes, on oral antidiabetic regimen returning after another long absence.  Last visit 9 months ago.  At last visit, sugars were mostly at goal and his HbA1c was 6.5%.  We stopped Januvia and continue Metformin and Invokana.  Since then, he was lost to follow-up for me with an HbA1c last month returned higher, at 7.2%.  He contacted me since last visit as he wanted to switch to a GLP-1 receptor agonist.  I advised him that I cannot prescribe this without an appointment. -At this visit, his sugars are approximately the same as before, with only occasional mild hypoglycemic spikes.  He feels that his sugars could be better if he  were exercising.  He plans to start going to the gym after his second coronavirus vaccine, which is planned soon. -For now, he also tells me that he develops diarrhea with Metformin ER so I advised him to decrease the dose to only 500 mg twice daily -In the meantime, we will try a GLP-1 receptor agonist, Ozempic, at the low dose and increase as tolerated.  We discussed about benefits and possible side effects. - I suggested to:  Patient Instructions  Please continue: - Invokana 300 mg daily in am  Please decrease: - Metformin ER 500 mg 2x daily with meals  Please start: - Ozempic 0.25 mg weekly in a.m. (for example on Sunday morning) x 4 weeks, then increase to 0.5 mg weekly in a.m. if no nausea or hypoglycemia.  Please return in 4 months with your sugar log.    - we will check his HbA1c when he returns to the clinic - advised to check sugars at different times of the day - 1-2x a day, rotating check times - advised for yearly eye exams >> he is not UTD - return to clinic in 4 months  2.  Overweight -Continue SGLT 2 inhibitor which should also help with weight loss -Also continue dietary changes and exercise -She lost approximately 9 pounds before last visit, but gained approximately 8 pounds since last visit, now gained 5 lbs since last OV  3. HL -Reviewed latest lipid panel from 03/2019: LDL slightly above goal, triglycerides high, HDL low Lab Results  Component Value Date   CHOL 164 03/20/2019   HDL 36.40 (L) 03/20/2019   LDLCALC 99 06/12/2018   LDLDIRECT 102.0 03/20/2019   TRIG 236.0 (H) 03/20/2019   CHOLHDL 5 03/20/2019  -Continues atorvastatin without side effects.  Philemon Kingdom, MD PhD [facility] Endocrinology

## 2019-04-27 DIAGNOSIS — Z23 Encounter for immunization: Secondary | ICD-10-CM | POA: Diagnosis not present

## 2019-05-01 MED FILL — INVOKANA 300 MG TABLET: 300 | 30 days supply | Qty: 30 | Fill #8

## 2019-05-01 MED FILL — ASPIRIN EC 325 MG TABLET: 325 | 90 days supply | Qty: 90 | Fill #1

## 2019-05-24 ENCOUNTER — Encounter: Payer: Self-pay | Admitting: Internal Medicine

## 2019-05-28 MED FILL — INVOKANA 300 MG TABLET: 300 | 30 days supply | Qty: 30 | Fill #9

## 2019-06-14 ENCOUNTER — Encounter: Payer: Self-pay | Admitting: Internal Medicine

## 2019-06-18 ENCOUNTER — Encounter: Payer: Self-pay | Admitting: Internal Medicine

## 2019-06-18 MED ORDER — BLOOD GLUCOSE METER KIT: PACK | 0 refills | Status: DC

## 2019-06-18 MED ORDER — FREESTYLE LITE DEVI: 0 refills | Status: DC

## 2019-06-20 ENCOUNTER — Other Ambulatory Visit: Payer: Self-pay | Admitting: Internal Medicine

## 2019-06-20 MED FILL — FREESTYLE LITE TEST STRIP: 90 days supply | Qty: 300 | Fill #1

## 2019-06-20 MED FILL — FREESTYLE LITE METER: 30 days supply | Qty: 1 | Fill #0

## 2019-06-21 MED FILL — FREESTYLE LANCETS: 33 days supply | Qty: 100 | Fill #0

## 2019-06-27 DIAGNOSIS — M79641 Pain in right hand: Secondary | ICD-10-CM | POA: Diagnosis not present

## 2019-06-27 DIAGNOSIS — M65321 Trigger finger, right index finger: Secondary | ICD-10-CM | POA: Diagnosis not present

## 2019-06-27 DIAGNOSIS — G5601 Carpal tunnel syndrome, right upper limb: Secondary | ICD-10-CM | POA: Diagnosis not present

## 2019-06-27 DIAGNOSIS — M65331 Trigger finger, right middle finger: Secondary | ICD-10-CM | POA: Diagnosis not present

## 2019-07-02 MED FILL — OZEMPIC 0.25 OR 0.5 MG/DOSE: 2 | 56 days supply | Qty: 3 | Fill #1

## 2019-07-02 MED FILL — INVOKANA 300 MG TABLET: 300 | 30 days supply | Qty: 30 | Fill #10

## 2019-07-04 DIAGNOSIS — E119 Type 2 diabetes mellitus without complications: Secondary | ICD-10-CM | POA: Diagnosis not present

## 2019-07-04 DIAGNOSIS — Z8546 Personal history of malignant neoplasm of prostate: Secondary | ICD-10-CM | POA: Diagnosis not present

## 2019-07-04 DIAGNOSIS — G4700 Insomnia, unspecified: Secondary | ICD-10-CM | POA: Diagnosis not present

## 2019-07-04 DIAGNOSIS — N5231 Erectile dysfunction following radical prostatectomy: Secondary | ICD-10-CM | POA: Diagnosis not present

## 2019-07-17 DIAGNOSIS — H524 Presbyopia: Secondary | ICD-10-CM | POA: Diagnosis not present

## 2019-07-17 DIAGNOSIS — E1136 Type 2 diabetes mellitus with diabetic cataract: Secondary | ICD-10-CM | POA: Diagnosis not present

## 2019-07-17 DIAGNOSIS — H2513 Age-related nuclear cataract, bilateral: Secondary | ICD-10-CM | POA: Diagnosis not present

## 2019-08-05 ENCOUNTER — Encounter: Payer: Self-pay | Admitting: Family

## 2019-08-06 ENCOUNTER — Other Ambulatory Visit: Payer: Self-pay | Admitting: Internal Medicine

## 2019-08-06 ENCOUNTER — Other Ambulatory Visit: Payer: Self-pay | Admitting: Family

## 2019-08-06 MED ORDER — ZOLPIDEM TARTRATE 10 MG PO TABS: 10.0000 mg | ORAL_TABLET | Freq: Every evening | ORAL | 0 refills | Status: DC | PRN

## 2019-08-06 MED FILL — INVOKANA 300 MG TABLET: 300 | 30 days supply | Qty: 30 | Fill #0

## 2019-08-07 MED FILL — ZOLPIDEM TARTRATE 10 MG TAB: 10 | 30 days supply | Qty: 30 | Fill #0

## 2019-08-07 MED FILL — ASPIRIN EC 325 MG TABLET: 325 | 90 days supply | Qty: 90 | Fill #0

## 2019-09-03 MED FILL — OZEMPIC 0.25 OR 0.5 MG/DOSE: 2 | 84 days supply | Qty: 5 | Fill #2

## 2019-09-04 ENCOUNTER — Other Ambulatory Visit: Payer: Self-pay | Admitting: Internal Medicine

## 2019-09-04 DIAGNOSIS — N5201 Erectile dysfunction due to arterial insufficiency: Secondary | ICD-10-CM | POA: Diagnosis not present

## 2019-09-04 DIAGNOSIS — E291 Testicular hypofunction: Secondary | ICD-10-CM | POA: Diagnosis not present

## 2019-09-04 DIAGNOSIS — N393 Stress incontinence (female) (male): Secondary | ICD-10-CM | POA: Diagnosis not present

## 2019-09-04 DIAGNOSIS — C61 Malignant neoplasm of prostate: Secondary | ICD-10-CM | POA: Diagnosis not present

## 2019-09-04 MED FILL — metFORMIN HCL ER 500 MG TB2: 500 | 90 days supply | Qty: 360 | Fill #0

## 2019-09-04 MED FILL — INVOKANA 300 MG TABLET: 300 | 30 days supply | Qty: 30 | Fill #1

## 2019-10-11 MED FILL — INVOKANA 300 MG TABLET: 300 | 30 days supply | Qty: 30 | Fill #2

## 2019-11-06 ENCOUNTER — Other Ambulatory Visit: Payer: Self-pay | Admitting: Family

## 2019-11-07 ENCOUNTER — Other Ambulatory Visit: Payer: Self-pay | Admitting: Internal Medicine

## 2019-11-07 ENCOUNTER — Telehealth: Payer: Self-pay | Admitting: Family

## 2019-11-07 MED ORDER — EPINEPHRINE 0.3 MG/0.3ML IJ SOAJ: 0.3000 mg | Freq: Once | INTRAMUSCULAR | 2 refills | Status: AC | PRN

## 2019-11-07 MED ORDER — ZOLPIDEM TARTRATE 10 MG PO TABS: 10.0000 mg | ORAL_TABLET | Freq: Every evening | ORAL | 0 refills | Status: DC | PRN

## 2019-11-07 MED FILL — ZOLPIDEM TARTRATE 10 MG TAB: 10 | 30 days supply | Qty: 30 | Fill #0

## 2019-11-07 MED FILL — EPINEPHRINE 0.3 MG AUTO-INJ: 0.3 | 2 days supply | Qty: 2 | Fill #0

## 2019-11-07 NOTE — Telephone Encounter (Signed)
Patient is due for follow up. Please contact pt to schedule.

## 2019-11-08 ENCOUNTER — Other Ambulatory Visit: Payer: Self-pay | Admitting: Internal Medicine

## 2019-11-08 MED FILL — INVOKANA 300 MG TABLET: 300 | 30 days supply | Qty: 30 | Fill #0

## 2019-11-09 MED FILL — OZEMPIC 0.25 OR 0.5 MG/DOSE: 2 | 84 days supply | Qty: 5 | Fill #3

## 2019-11-09 NOTE — Telephone Encounter (Signed)
lvm to schedule appt.  

## 2019-12-12 MED FILL — ASPIRIN EC 325 MG TABLET: 325 | 90 days supply | Qty: 90 | Fill #1

## 2019-12-14 ENCOUNTER — Ambulatory Visit: Payer: 59 | Admitting: Family

## 2019-12-14 ENCOUNTER — Other Ambulatory Visit: Payer: Self-pay

## 2019-12-14 VITALS — BP 149/76 | HR 74 | Temp 98.4°F | Resp 16 | Ht 68.0 in | Wt 186.0 lb

## 2019-12-14 DIAGNOSIS — E1165 Type 2 diabetes mellitus with hyperglycemia: Secondary | ICD-10-CM

## 2019-12-14 DIAGNOSIS — J3089 Other allergic rhinitis: Secondary | ICD-10-CM

## 2019-12-14 DIAGNOSIS — G4700 Insomnia, unspecified: Secondary | ICD-10-CM

## 2019-12-14 DIAGNOSIS — E785 Hyperlipidemia, unspecified: Secondary | ICD-10-CM | POA: Diagnosis not present

## 2019-12-14 DIAGNOSIS — C61 Malignant neoplasm of prostate: Secondary | ICD-10-CM | POA: Diagnosis not present

## 2019-12-14 NOTE — Progress Notes (Signed)
Subjective:    Patient ID: Larry Davis, male    DOB: Nov 05, 1955, 64 y.o.   MRN: [id number]  HPI  Patient is a 64 yr old male who presents today for follow up.  DM2- Reports sugars 100-115, evenings 130. Reports ozempic has really helps his blood sugar.  Lab Results  Component Value Date   HGBA1C 7.2 (H) 03/20/2019   HGBA1C 6.5 06/12/2018   HGBA1C 6.4 05/04/2017   Lab Results  Component Value Date   MICROALBUR 1.2 06/12/2018   LDLCALC 99 06/12/2018   CREATININE 1.02 03/20/2019   Hyperlipidemia-has declined statin.  Lab Results  Component Value Date   CHOL 164 03/20/2019   HDL 36.40 (L) 03/20/2019   LDLCALC 99 06/12/2018   LDLDIRECT 102.0 03/20/2019   TRIG 236.0 (H) 03/20/2019   CHOLHDL 5 03/20/2019   Nasal congestion and cough- states that this is his regular AM allergy symptoms.   Reports that he uses ambien prn.    Review of Systems See HPI  Past Medical History:  Diagnosis Date  . Allergy   . 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 Diabetes mellitus without complication ([place])   . PONV (postoperative nausea and vomiting)   . Prostate cancer ([facility]) 03/2014  . Urticaria      Social History   Socioeconomic History  . Marital status: Married    Spouse name: Not on file  . Number of children: Not on file  . Years of education: Not on file  . Highest education level: Not on file  Occupational History  . Not on file  Tobacco Use  . Smoking status: Former Smoker    Packs/day: 0.50    Years: 10.00    Pack years: 5.00    Types: Cigarettes    Quit date: 02/15/1978    Years since quitting: 41.8  . Smokeless tobacco: Never Used  Vaping Use  . Vaping Use: Never used  Substance and Sexual Activity  . Alcohol use: No    Alcohol/week: 0.0 standard drinks  . Drug use: No  . Sexual activity: Not on file  Other Topics Concern  . Not on file  Social History Narrative   2 children Son and daughter 3 biological grandchildren- live in [place]    Wife has 4 children   Married   Futures trader- retired since 11/18   Has masters degree   Enjoys Motorcycling    Social Determinants of Radio broadcast assistant Strain:   . Difficulty of Paying Living Expenses: Not on file  Food Insecurity:   . Worried About Charity fundraiser in the Last Year: Not on file  . Ran Out of Food in the Last Year: Not on file  Transportation Needs:   . Lack of Transportation (Medical): Not on file  . Lack of Transportation (Non-Medical): Not on file  Physical Activity:   . Days of Exercise per Week: Not on file  . Minutes of Exercise per Session: Not on file  Stress:   . Feeling of Stress : Not on file  Social Connections:   . Frequency of Communication with Friends and Family: Not on file  . Frequency of Social Gatherings with Friends and Family: Not on file  . Attends Religious Services: Not on file  . Active Member of Clubs or Organizations: Not on file  . Attends Archivist Meetings: Not on file  . Marital Status: Not on file  Intimate Partner Violence:   . Fear of Current or  Ex-Partner: Not on file  . Emotionally Abused: Not on file  . Physically Abused: Not on file  . Sexually Abused: Not on file    Past Surgical History:  Procedure Laterality Date  . ADENOIDECTOMY    . CHOLECYSTECTOMY  2000  . COLONOSCOPY    . LYMPHADENECTOMY Bilateral 06/14/2014   Procedure: PELVIC LYMPH NODE DISSECTION;  Surgeon: Alexis Frock, MD;  Location: [facility];  Service: Urology;  Laterality: Bilateral;  . REVERSE SHOULDER ARTHROPLASTY Left 01/19/2018  . REVERSE SHOULDER ARTHROPLASTY Left 01/19/2018   Procedure: REVERSE SHOULDER ARTHROPLASTY;  Surgeon: Justice Britain, MD;  Location: [place];  Service: Orthopedics;  Laterality: Left;  157mn  Please move 7:30am [place] to 10am  . ROBOT ASSISTED LAPAROSCOPIC RADICAL PROSTATECTOMY N/A 06/14/2014   Procedure: ROBOTIC ASSISTED LAPAROSCOPIC RADICAL PROSTATECTOMY WITH INDOCYANINE GREEN DYE INJECTION;  Surgeon:  TAlexis Frock MD;  Location: [facility];  Service: Urology;  Laterality: N/A;  . SHOULDER SURGERY Right 1990   arthroscopy  . TONSILLECTOMY      Family History  Problem Relation Age of Onset  . Heart disease Mother   . Diabetes Mother   . Arthritis Mother   . Hypertension Mother   . Heart disease Father   . Hypertension Father   . Diabetes Father   . Arthritis Father   . Cancer Maternal Uncle        prostate  . Cancer Maternal Grandmother        cervical    Allergies  Allergen Reactions  . Bee Venom Anaphylaxis and Other (See Comments)    Throat swells  . Beef-Derived Products Anaphylaxis    Current Outpatient Medications on File Prior to Visit  Medication Sig Dispense Refill  . aspirin EC 325 MG tablet TAKE 1 TABLET (325 MG TOTAL) BY MOUTH DAILY. 90 tablet 3  . blood glucose meter kit and supplies Dispense based on patient and insurance preference. Use 2 times daily as directed. 1 each 0  . Blood Glucose Monitoring Suppl (FREESTYLE LITE) DEVI Use as directed 1 each 0  . EPINEPHrine 0.3 mg/0.3 mL IJ SOAJ injection Inject 0.3 mg into the muscle once as needed for up to 1 dose for anaphylaxis. 2 each 2  . glucose blood (FREESTYLE LITE) test strip USE AS DIRECTED TO CHECK BLOOD SUGAR UP TO 3 TIMES DAILY 300 strip 6  . INVOKANA 300 MG TABS tablet TAKE 1 TABLET BY MOUTH DAILY BEFORE BREAKFAST. 30 tablet 2  . Lancets (FREESTYLE) lancets USE TO CHECK BLOOD SUGARS UP TO 3 TIMES DAILY 100 each 12  . metFORMIN (GLUCOPHAGE-XR) 500 MG 24 hr tablet TAKE TWO TABLETS BY MOUTH 2 TIMES A DAY WITH MEALS 360 tablet 3  . Semaglutide,0.25 or 0.5MG/DOS, (OZEMPIC, 0.25 OR 0.5 MG/DOSE,) 2 MG/1.5ML SOPN Inject 0.5 mg into the skin once a week. 2 pen 5  . zolpidem (AMBIEN) 10 MG tablet Take 1 tablet (10 mg total) by mouth at bedtime as needed for sleep. 30 tablet 0   No current facility-administered medications on file prior to visit.    BP (!) 149/76 (BP Location: Right Arm, Patient Position:  Sitting, Cuff Size: Small)   Pulse 74   Temp 98.4 F (36.9 C) (Temporal)   Resp 16   Ht '5\' 8"'  (1.727 m)   Wt 186 lb (84.4 kg)   SpO2 99%   BMI 28.28 kg/m       Objective:   Physical Exam Constitutional:      General: He is not in  acute distress.    Appearance: He is well-developed.  HENT:     Head: Normocephalic and atraumatic.  Cardiovascular:     Rate and Rhythm: Normal rate and regular rhythm.     Heart sounds: No murmur heard.   Pulmonary:     Effort: Pulmonary effort is normal. No respiratory distress.     Breath sounds: Normal breath sounds. No wheezing or rales.  Skin:    General: Skin is warm and dry.  Neurological:     Mental Status: He is alert and oriented to person, place, and time.  Psychiatric:        Behavior: Behavior normal.        Thought Content: Thought content normal.           Assessment & Plan:  History of prostate CA- pt requesting PSA.  States he had covid vaccine- but does not have his immunization record with him.  DM2- clinically stable. Management per endocrinology.  Hyperlipidemia- declines statin therapy.  Allergic rhinitis- at baseline. Monitor.  Insomnia- stable with prn use of ambien 87m.   Flu shot today.  This visit occurred during the SARS-CoV-2 public health emergency.  Safety protocols were in place, including screening questions prior to the visit, additional usage of staff PPE, and extensive cleaning of exam room while observing appropriate contact time as indicated for disinfecting solutions.

## 2019-12-15 LAB — PSA: PSA: 0.32 ng/mL (ref <=4.0)

## 2019-12-15 LAB — COMPREHENSIVE METABOLIC PANEL
AG Ratio: 1.8 (calc) (ref 1.0–2.5)
ALT: 22 U/L (ref 9–46)
AST: 17 U/L (ref 10–35)
Albumin: 4.6 g/dL (ref 3.6–5.1)
Alkaline phosphatase (APISO): 69 U/L (ref 35–144)
BUN: 16 mg/dL (ref 7–25)
CO2: 23 mmol/L (ref 20–32)
Calcium: 10 mg/dL (ref 8.6–10.3)
Chloride: 103 mmol/L (ref 98–110)
Creat: 1.02 mg/dL (ref 0.70–1.25)
Globulin: 2.5 g/dL (calc) (ref 1.9–3.7)
Glucose, Bld: 140 mg/dL — ABNORMAL HIGH (ref 65–99)
Potassium: 4.6 mmol/L (ref 3.5–5.3)
Sodium: 139 mmol/L (ref 135–146)
Total Bilirubin: 0.7 mg/dL (ref 0.2–1.2)
Total Protein: 7.1 g/dL (ref 6.1–8.1)

## 2019-12-15 LAB — HEMOGLOBIN A1C
Hgb A1c MFr Bld: 6.1 % of total Hgb — ABNORMAL HIGH (ref <5.7)
Mean Plasma Glucose: 128 (calc)
eAG (mmol/L): 7.1 (calc)

## 2019-12-19 DIAGNOSIS — M79641 Pain in right hand: Secondary | ICD-10-CM | POA: Diagnosis not present

## 2019-12-19 DIAGNOSIS — G5601 Carpal tunnel syndrome, right upper limb: Secondary | ICD-10-CM | POA: Diagnosis not present

## 2019-12-19 DIAGNOSIS — M65321 Trigger finger, right index finger: Secondary | ICD-10-CM | POA: Diagnosis not present

## 2019-12-19 DIAGNOSIS — M65331 Trigger finger, right middle finger: Secondary | ICD-10-CM | POA: Diagnosis not present

## 2019-12-24 MED FILL — INVOKANA 300 MG TABLET: 300 | 30 days supply | Qty: 30 | Fill #1

## 2020-01-05 ENCOUNTER — Encounter: Payer: Self-pay | Admitting: Family

## 2020-01-29 ENCOUNTER — Telehealth: Payer: Self-pay | Admitting: Family

## 2020-01-29 DIAGNOSIS — E1165 Type 2 diabetes mellitus with hyperglycemia: Secondary | ICD-10-CM

## 2020-01-29 MED FILL — INVOKANA 300 MG TABLET: 300 | 30 days supply | Qty: 30 | Fill #2

## 2020-01-29 NOTE — Telephone Encounter (Signed)
Please advise pt that referral has been placed.

## 2020-01-29 NOTE — Telephone Encounter (Signed)
Pt came in office stating that he needed a referral for endocronologist, pt is wanting to see Dr Derenda Fennel. If possible please put in a referral ASAP.  Pt would like to be call when referral in at [phone]. {;ease advise.

## 2020-01-30 NOTE — Telephone Encounter (Signed)
Patient advised referral was entered

## 2020-01-31 DIAGNOSIS — Z20822 Contact with and (suspected) exposure to covid-19: Secondary | ICD-10-CM | POA: Diagnosis not present

## 2020-02-20 ENCOUNTER — Other Ambulatory Visit: Payer: Self-pay | Admitting: Family

## 2020-02-20 MED FILL — OZEMPIC 0.25 OR 0.5 MG/DOSE: 2 | 56 days supply | Qty: 3 | Fill #4

## 2020-02-20 NOTE — Telephone Encounter (Signed)
Requesting: Ambien 10mg  Contract: 03/20/19 UDS: 03/20/19 Last Visit: 03/20/19  Next Visit: None Last Refill: 11/07/19 #30 and 0RF  Please Advise

## 2020-02-21 ENCOUNTER — Other Ambulatory Visit: Payer: Self-pay | Admitting: Family

## 2020-02-21 MED FILL — ZOLPIDEM TARTRATE 10 MG TAB: 10 | 30 days supply | Qty: 30 | Fill #0

## 2020-03-04 DIAGNOSIS — C61 Malignant neoplasm of prostate: Secondary | ICD-10-CM | POA: Diagnosis not present

## 2020-03-17 ENCOUNTER — Other Ambulatory Visit: Payer: Self-pay | Admitting: Internal Medicine

## 2020-03-17 MED FILL — INVOKANA 300 MG TABLET: 300 | 30 days supply | Qty: 30 | Fill #0

## 2020-03-17 MED FILL — metFORMIN HCL ER 500 MG TB2: 500 | 90 days supply | Qty: 360 | Fill #1

## 2020-03-18 DIAGNOSIS — N393 Stress incontinence (female) (male): Secondary | ICD-10-CM | POA: Diagnosis not present

## 2020-03-18 DIAGNOSIS — N5201 Erectile dysfunction due to arterial insufficiency: Secondary | ICD-10-CM | POA: Diagnosis not present

## 2020-03-18 DIAGNOSIS — C61 Malignant neoplasm of prostate: Secondary | ICD-10-CM | POA: Diagnosis not present

## 2020-04-18 ENCOUNTER — Other Ambulatory Visit: Payer: Self-pay | Admitting: Internal Medicine

## 2020-04-18 MED FILL — ASPIRIN EC 325 MG TABLET: 325 | 100 days supply | Qty: 100 | Fill #2

## 2020-04-22 ENCOUNTER — Other Ambulatory Visit: Payer: Self-pay

## 2020-04-22 ENCOUNTER — Other Ambulatory Visit: Payer: Self-pay | Admitting: Internal Medicine

## 2020-04-22 ENCOUNTER — Ambulatory Visit: Payer: 59 | Admitting: Internal Medicine

## 2020-04-22 ENCOUNTER — Encounter: Payer: Self-pay | Admitting: Internal Medicine

## 2020-04-22 VITALS — BP 142/72 | HR 80 | Ht 68.0 in | Wt 189.2 lb

## 2020-04-22 DIAGNOSIS — E785 Hyperlipidemia, unspecified: Secondary | ICD-10-CM | POA: Diagnosis not present

## 2020-04-22 DIAGNOSIS — E1165 Type 2 diabetes mellitus with hyperglycemia: Secondary | ICD-10-CM | POA: Diagnosis not present

## 2020-04-22 LAB — LIPID PANEL
Cholesterol: 150 mg/dL (ref 0–200)
HDL: 43.5 mg/dL
LDL Cholesterol: 86 mg/dL (ref 0–99)
NonHDL: 106.3
Total CHOL/HDL Ratio: 3
Triglycerides: 103 mg/dL (ref 0.0–149.0)
VLDL: 20.6 mg/dL (ref 0.0–40.0)

## 2020-04-22 LAB — MICROALBUMIN / CREATININE URINE RATIO
Creatinine,U: 55.3 mg/dL
Microalb Creat Ratio: 1.3 mg/g (ref 0.0–30.0)
Microalb, Ur: <0.7 mg/dL (ref 0.0–1.9)

## 2020-04-22 LAB — POCT GLYCOSYLATED HEMOGLOBIN (HGB A1C): Hemoglobin A1C: 6.3 % — AB (ref 4.0–5.6)

## 2020-04-22 LAB — POCT GLUCOSE (DEVICE FOR HOME USE): POC Glucose: 144 mg/dl — AB (ref 70–99)

## 2020-04-22 MED ORDER — CANAGLIFLOZIN 300 MG PO TABS: 300.0000 mg | ORAL_TABLET | Freq: Every day | ORAL | 3 refills | Status: DC

## 2020-04-22 MED ORDER — OZEMPIC (0.25 OR 0.5 MG/DOSE) 2 MG/1.5ML ~~LOC~~ SOPN: 0.5000 mg | PEN_INJECTOR | SUBCUTANEOUS | 3 refills | Status: DC

## 2020-04-22 MED ORDER — ATORVASTATIN CALCIUM 10 MG PO TABS: 10.0000 mg | ORAL_TABLET | Freq: Every day | ORAL | 3 refills | Status: DC

## 2020-04-22 MED FILL — ATORVASTATIN CALCIUM 10 MG: 10 | 90 days supply | Qty: 90 | Fill #0

## 2020-04-22 MED FILL — INVOKANA 300 MG TABLET: 300 | 90 days supply | Qty: 90 | Fill #0

## 2020-04-22 MED FILL — OZEMPIC 0.25 OR 0.5 MG/DOSE: 2 | 84 days supply | Qty: 5 | Fill #0

## 2020-04-22 NOTE — Patient Instructions (Signed)
-   Decrease Metformin 500 mg, 1 tablet daily  - Continue Invokana 300 mg daily  - Continue Ozempic 0.5 mg weekly  - Start Atorvastatin 10 mg daily ( for cholesterol)      HOW TO TREAT LOW BLOOD SUGARS (Blood sugar LESS THAN 70 MG/DL)  Please follow the RULE OF 15 for the treatment of hypoglycemia treatment (when your (blood sugars are less than 70 mg/dL)    STEP 1: Take 15 grams of carbohydrates when your blood sugar is low, which includes:   3-4 GLUCOSE TABS  OR  3-4 OZ OF JUICE OR REGULAR SODA OR  ONE TUBE OF GLUCOSE GEL     STEP 2: RECHECK blood sugar in 15 MINUTES STEP 3: If your blood sugar is still low at the 15 minute recheck --> then, go back to STEP 1 and treat AGAIN with another 15 grams of carbohydrates.

## 2020-04-22 NOTE — Progress Notes (Signed)
Name: Larry Davis  Age/ Sex: 65 y.o., male   MRN/ DOB: [id number], 02/25/1955     PCP: Debbrah Alar, NP   Reason for Endocrinology Evaluation: Type 2 Diabetes Mellitus  Initial Endocrine Consultative Visit: 06/07/2016    PATIENT IDENTIFIER: Larry Davis is a 65 y.o. male with a past medical history of T2Dm, Dyslipidemia and Hx of prostate ca . The patient has followed with Endocrinology clinic since 06/07/2016 for consultative assistance with management of his diabetes.  DIABETIC HISTORY:  Larry Davis was diagnosed with DM in 2008, he was on Januvia in the past ( stopped 6/20201, as well as Glipizide.intolerant to higher doses of Metformin. Ozempic started 04/2019.  His hemoglobin A1c has ranged from 6.1% in 2021, peaking at 8.7% in 2016.    No personal history of pancreatitis    Switched from Dr. Cruzita Lederer to me by 04/2020  SUBJECTIVE:   During the last visit (04/24/2019): A1c 7.2%   Metformin reduced by 50%, continued Invokana and started Ozempic  Today (04/22/2020): Larry Davis is here to establish care with me.  He checks his blood sugars 1-2 times daily, preprandial to breakfast. The patient has not had hypoglycemic episodes since the last clinic visit.   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,3-galactose    Diabetes mellitus without complication ([facility])    PONV (postoperative nausea and vomiting)    Prostate cancer ([place]) 03/2014   Urticaria     Past Surgical History:  Past Surgical History:  Procedure Laterality Date   ADENOIDECTOMY     CHOLECYSTECTOMY  2000   COLONOSCOPY     LYMPHADENECTOMY Bilateral 06/14/2014   Procedure: PELVIC LYMPH NODE DISSECTION;  Surgeon: Alexis Frock, MD;  [facility];  Service: Urology;  Laterality: Bilateral;   REVERSE SHOULDER ARTHROPLASTY Left 01/19/2018   REVERSE SHOULDER ARTHROPLASTY Left 01/19/2018   Procedure: REVERSE SHOULDER ARTHROPLASTY;  Surgeon: Justice Britain, MD;  Location: [facility];  Service: Orthopedics;  Laterality: Left;  182mn  Please move 7:30am [place] to 10am   ROBOT ASSISTED LAPAROSCOPIC RADICAL PROSTATECTOMY N/A 06/14/2014   Procedure: ROBOTIC ASSISTED LAPAROSCOPIC RADICAL PROSTATECTOMY WITH INDOCYANINE GREEN DYE INJECTION;  Surgeon: TAlexis Frock MD;  [facility];  Service: Urology;  Laterality: N/A;   SHOULDER SURGERY Right 1990   arthroscopy   TONSILLECTOMY      Social History:  reports that he quit smoking about 42 years ago. His smoking use included cigarettes. He has a 5.00 pack-year smoking history. He has never used smokeless tobacco. He reports that he does not drink alcohol and does not use drugs. Family History:  Family History  Problem Relation Age of Onset   Heart disease Mother    Diabetes Mother    Arthritis Mother    Hypertension Mother    Heart disease Father    Hypertension Father    Diabetes Father    Arthritis Father    Cancer Maternal Uncle  prostate   Cancer Maternal Grandmother        cervical     HOME MEDICATIONS: Allergies as of 04/22/2020      Reactions   Bee Venom Anaphylaxis, Other (See Comments)   Throat swells   Beef-derived Products Anaphylaxis      Medication List       Accurate as of April 22, 2020 10:09 AM. If you have any questions, ask your nurse or doctor.        aspirin EC 325 MG tablet TAKE 1 TABLET (325 MG TOTAL) BY MOUTH DAILY.   blood glucose meter kit and supplies Dispense  based on patient and insurance preference. Use 2 times daily as directed.   EPINEPHrine 0.3 mg/0.3 mL Soaj injection Commonly known as: EPI-PEN Inject 0.3 mg into the muscle once as needed for up to 1 dose for anaphylaxis.   freestyle lancets USE TO CHECK BLOOD SUGARS UP TO 3 TIMES DAILY   FreeStyle Lite Devi Use as directed   FREESTYLE LITE test strip Generic drug: glucose blood USE AS DIRECTED TO CHECK BLOOD SUGAR UP TO 3 TIMES DAILY   Invokana 300 MG Tabs tablet Generic drug: canagliflozin TAKE 1 TABLET BY MOUTH DAILY BEFORE BREAKFAST.   metFORMIN 500 MG 24 hr tablet Commonly known as: GLUCOPHAGE-XR TAKE TWO TABLETS BY MOUTH 2 TIMES A DAY WITH MEALS   Ozempic (0.25 or 0.5 MG/DOSE) 2 MG/1.5ML Sopn Generic drug: Semaglutide(0.25 or 0.5MG/DOS) Inject 0.5 mg into the skin once a week.   zolpidem 10 MG tablet Commonly known as: AMBIEN TAKE 1 TABLET (10 MG TOTAL) BY MOUTH AT BEDTIME AS NEEDED FOR SLEEP.        OBJECTIVE:   Vital Signs: BP (!) 142/72    Pulse 80    Ht _0  (1.727 m)    Wt 189 lb 4 oz (85.8 kg)    SpO2 92%    BMI 28.78 kg/m   Wt Readings from Last 3 Encounters:  04/22/20 189 lb 4 oz (85.8 kg)  12/14/19 186 lb (84.4 kg)  03/20/19 191 lb (86.6 kg)     Exam: General: Pt appears well and is in NAD  Neck: General: Supple without adenopathy. Thyroid: Thyroid size normal.  No goiter or nodules appreciated.  Lungs: Clear with good BS bilat with no rales, rhonchi, or wheezes  Heart: RRR   Abdomen: Normoactive bowel sounds, soft, nontender, without masses or organomegaly palpable  Extremities: No pretibial edema.   Neuro: MS is good with appropriate affect, pt is alert and Ox3    DM foot exam:  04/22/2020    The skin of the feet is intact without sores or ulcerations. The pedal pulses are 2+ on right and 2+ on left. The sensation is intact to a screening 5.07, 10 gram monofilament bilaterally    DATA REVIEWED:  Lab Results  Component Value Date    HGBA1C 6.3 (A) 04/22/2020   HGBA1C 6.1 (H) 12/14/2019   HGBA1C 7.2 (H) 03/20/2019           Results for ROLONDO, PIERRE (MRN [id number]) as of 04/23/2020 11:04  Ref. Range 04/22/2020 10:23  Total CHOL/HDL Ratio Unknown 3  Cholesterol Latest Ref Range: 0 - 200 mg/dL 150  HDL Cholesterol Latest Ref Range: >39.00 mg/dL 43.50  LDL (calc) Latest Ref Range: 0 - 99 mg/dL 86  MICROALB/CREAT RATIO Latest Ref Range: 0.0 - 30.0 mg/g 1.3  NonHDL Unknown 106.30  Triglycerides Latest Ref Range: 0.0 - 149.0 mg/dL 103.0  VLDL Latest Ref Range: 0.0 - 40.0 mg/dL 20.6  Creatinine,U Latest Units: mg/dL 55.3  Microalb, Ur Latest Ref Range: 0.0 - 1.9 mg/dL <0.7     ASSESSMENT / PLAN / RECOMMENDATIONS:   1) Type 2 Diabetes Mellitus, Optimally controlled, Without complications - Most recent A1c of 6.3 %. Goal A1c < 7.0 %.   - He is intolerant to higher doses of metformin due to diarrhea - He is asking if he could stop the Invokana, no side effects but he would like to take less meds, we discussed the benefits of Invokana and I would like for him to stay on it but would prefer to reduce Metformin , which he agreed to.  - BMP normal    MEDICATIONS: - Decrease Metformin 500 mg, 1 tablet daily  - Continue Invokana 300 mg daily  - Continue Ozempic 0.5 mg weekly    EDUCATION / INSTRUCTIONS:  BG monitoring instructions: Patient is instructed to check his blood sugars 1 times a day, fasting .  Call [facility] if: BG persistently < 70   I reviewed the Rule of 15 for the treatment of hypoglycemia in detail with the patient. Literature supplied.   2) Diabetic complications:   Eye: Does not have known diabetic retinopathy.   Neuro/ Feet: Does not have known diabetic peripheral neuropathy .   Renal: Patient does not have known baseline CKD. He   is not on an ACEI/ARB at present. Up to date on MA/Cr ratio   3) Dyslipidemia:   - LDL was above goal as well as Tg in the past , we  initially discussed starting  Statins due to cardiovascular benefits. But his LDL is trending down and we opted to hold off.     F/U in 6 months   Signed electronically by: Mack Guise, MD  [facility] Endocrinology  [facility] [address] [place], [place] [address] Phone: [phone] FAX: [phone]   CC: Debbrah Alar, [address] [place] [address] Phone: [phone]  Fax: [phone]  Return to Endocrinology clinic as below: No future appointments.

## 2020-04-23 ENCOUNTER — Encounter: Payer: Self-pay | Admitting: Internal Medicine

## 2020-05-21 ENCOUNTER — Other Ambulatory Visit: Payer: Self-pay | Admitting: Family

## 2020-05-21 ENCOUNTER — Other Ambulatory Visit (HOSPITAL_BASED_OUTPATIENT_CLINIC_OR_DEPARTMENT_OTHER): Payer: Self-pay

## 2020-05-23 ENCOUNTER — Other Ambulatory Visit: Payer: Self-pay | Admitting: Family

## 2020-05-23 ENCOUNTER — Other Ambulatory Visit (HOSPITAL_BASED_OUTPATIENT_CLINIC_OR_DEPARTMENT_OTHER): Payer: Self-pay

## 2020-05-26 ENCOUNTER — Encounter: Payer: Self-pay | Admitting: Family

## 2020-05-26 ENCOUNTER — Other Ambulatory Visit: Payer: Self-pay

## 2020-05-26 NOTE — Telephone Encounter (Signed)
Patient requesting refill of Larry Davis, patient advised he is past due for annual physical (was last seen Oct 2021 for prostate issues).  He was scheduled for physical 06-20-2020.   Last RX:02-21-2020 Last OV:Oct 2021 Next OV:06-20-2020 UDS:03-20-19 CSC:02-25-2017

## 2020-05-26 NOTE — Telephone Encounter (Signed)
Lvm for patient to call back

## 2020-05-27 ENCOUNTER — Other Ambulatory Visit (HOSPITAL_BASED_OUTPATIENT_CLINIC_OR_DEPARTMENT_OTHER): Payer: Self-pay

## 2020-05-27 MED ORDER — ZOLPIDEM TARTRATE 10 MG PO TABS
ORAL_TABLET | Freq: Every evening | ORAL | 0 refills | Status: DC | PRN
  Filled 2020-05-27: qty 30, 30d supply, fill #0

## 2020-05-27 NOTE — Telephone Encounter (Signed)
See mychart.  

## 2020-06-20 ENCOUNTER — Ambulatory Visit (INDEPENDENT_AMBULATORY_CARE_PROVIDER_SITE_OTHER): Payer: 59 | Admitting: Family

## 2020-06-20 ENCOUNTER — Other Ambulatory Visit: Payer: Self-pay

## 2020-06-20 ENCOUNTER — Encounter: Payer: Self-pay | Admitting: Family

## 2020-06-20 VITALS — BP 144/71 | HR 77 | Temp 98.1°F | Resp 16 | Ht 68.0 in | Wt 179.0 lb

## 2020-06-20 DIAGNOSIS — Z0000 Encounter for general adult medical examination without abnormal findings: Secondary | ICD-10-CM | POA: Diagnosis not present

## 2020-06-20 DIAGNOSIS — G4700 Insomnia, unspecified: Secondary | ICD-10-CM | POA: Diagnosis not present

## 2020-06-20 NOTE — Patient Instructions (Signed)
Please complete lab work prior to leaving.   

## 2020-06-20 NOTE — Progress Notes (Signed)
Subjective:   By signing my name below, I, Larry Davis, attest that this documentation has been prepared under the direction and in the presence of Debbrah Alar, NP. 06/20/2020      Patient ID: Larry Davis, male    DOB: 09-18-1955, 65 y.o.   MRN: [id number]  No chief complaint on file.   HPI Patient is in today for a comprehensive physical exam. He is doing well at this time. He denies having any cough, cold symptoms, constipation, diarrhea, dysuria, headaches, swollen glands, muscle pain, joint pain, anxiety, depression. He has not had any surgeries this past year. He does not drink alcohol, smoke cigarette, or use drugs. He has been retired for 3 years.  Sleep- He is managing his sleep issues well by taking melatonin and 10 mg Ambien daily PO. Diabetes- His diabetes is well managed at this time. Lab Results  Component Value Date   HGBA1C 6.3 (A) 04/22/2020   Immunizations: He is UTD on the shingles vaccinations. He is UTD on pneumonia vaccinations. He is UTD on tetanus vaccinations. He has taken the Flu vaccination recently. He has the Covid 19 vaccination and the first booster vaccinations and is willing to get the second booster as well. He is not interested in the HIV/Hepatitis screening at this time. Diet: His diet is well managed at this time. Exercise: He regularly participates in exercise by walking 10,000 steps daily. Colonoscopy: Last completed on 2014. Results normal. Repeat in 10 years. Dexa: Not yet completed. PSA: Last completed 12/14/2019. Results showed PSA slightly higher since last measurement. Dental: He is UTD on dental exams. Vision: He is due for a vision exam.   Past Medical History:  Diagnosis Date  . Allergy   . 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 Diabetes mellitus without complication ([place])   . PONV (postoperative nausea and vomiting)   . Prostate cancer ([facility] [facility]) 03/2014  . Urticaria     Past Surgical History:  Procedure  Laterality Date  . ADENOIDECTOMY    . CHOLECYSTECTOMY  2000  . COLONOSCOPY    . LYMPHADENECTOMY Bilateral 06/14/2014   Procedure: PELVIC LYMPH NODE DISSECTION;  Surgeon: Alexis Frock, MD;  Location: [facility];  Service: Urology;  Laterality: Bilateral;  . REVERSE SHOULDER ARTHROPLASTY Left 01/19/2018  . REVERSE SHOULDER ARTHROPLASTY Left 01/19/2018   Procedure: REVERSE SHOULDER ARTHROPLASTY;  Surgeon: Justice Britain, MD;  Location: [facility];  Service: Orthopedics;  Laterality: Left;  113mn  Please move 7:30am [place] to 10am  . ROBOT ASSISTED LAPAROSCOPIC RADICAL PROSTATECTOMY N/A 06/14/2014   Procedure: ROBOTIC ASSISTED LAPAROSCOPIC RADICAL PROSTATECTOMY WITH INDOCYANINE GREEN DYE INJECTION;  Surgeon: TAlexis Frock MD;  Location: [facility];  Service: Urology;  Laterality: N/A;  . SHOULDER SURGERY Right 1990   arthroscopy  . TONSILLECTOMY      Family History  Problem Relation Age of Onset  . Heart disease Mother   . Diabetes Mother   . Arthritis Mother   . Hypertension Mother   . Heart disease Father   . Hypertension Father   . Diabetes Father   . Arthritis Father   . Cancer Maternal Uncle        prostate  . Cancer Maternal Grandmother        cervical    Social History   Socioeconomic History  . Marital status: Married    Spouse name: Not on file  . Number of children: Not on file  . Years of education: Not on file  . Highest education level: Not  on file  Occupational History  . Not on file  Tobacco Use  . Smoking status: Former Smoker    Packs/day: 0.50    Years: 10.00    Pack years: 5.00    Types: Cigarettes    Quit date: 02/15/1978    Years since quitting: 42.3  . Smokeless tobacco: Never Used  Vaping Use  . Vaping Use: Never used  Substance and Sexual Activity  . Alcohol use: No    Alcohol/week: 0.0 standard drinks  . Drug use: No  . Sexual activity: Not on file  Other Topics Concern  . Not on file  Social History Narrative   2 children Son and daughter 3  biological grandchildren- live in [place]   Wife has 4 children   Married   Futures trader- retired since 11/18   Has masters degree   Enjoys Museum/gallery exhibitions officer    Social Determinants of Radio broadcast assistant Strain: Not on [employer] Insecurity: Not on file  Transportation Needs: Not on file  Physical Activity: Not on file  Stress: Not on file  Social Connections: Not on file  Intimate Partner Violence: Not on file    Outpatient Medications Prior to Visit  Medication Sig Dispense Refill  . aspirin EC 325 MG tablet TAKE 1 TABLET (325 MG TOTAL) BY MOUTH DAILY. 90 tablet 3  . blood glucose meter kit and supplies Dispense based on patient and insurance preference. Use 2 times daily as directed. 1 each 0  . Blood Glucose Monitoring Suppl (FREESTYLE LITE) DEVI Use as directed 1 each 0  . canagliflozin (INVOKANA) 300 MG TABS tablet TAKE 1 TABLET (300 MG TOTAL) BY MOUTH DAILY BEFORE BREAKFAST. 90 tablet 3  . EPINEPHrine 0.3 mg/0.3 mL IJ SOAJ injection Inject 0.3 mg into the muscle once as needed for up to 1 dose for anaphylaxis. (Patient not taking: Reported on 04/22/2020) 2 each 2  . glucose blood (FREESTYLE LITE) test strip USE AS DIRECTED TO CHECK BLOOD SUGAR UP TO 3 TIMES DAILY 300 strip 6  . Lancets (FREESTYLE) lancets USE TO CHECK BLOOD SUGARS UP TO 3 TIMES DAILY 100 each 12  . metFORMIN (GLUCOPHAGE-XR) 500 MG 24 hr tablet TAKE TWO TABLETS BY MOUTH 2 TIMES A DAY WITH MEALS 360 tablet 3  . Semaglutide,0.25 or 0.5MG/DOS, 2 MG/1.5ML SOPN INJECT 0.5 MG INTO THE SKIN ONCE A WEEK. 1.5 mL 3  . zolpidem (AMBIEN) 10 MG tablet TAKE 1 TABLET (10 MG TOTAL) BY MOUTH AT BEDTIME AS NEEDED FOR SLEEP. 30 tablet 0   No facility-administered medications prior to visit.    Allergies  Allergen Reactions  . Bee Venom Anaphylaxis and Other (See Comments)    Throat swells  . Beef-Derived Products Anaphylaxis    Review of Systems  HENT: Negative for congestion.   Respiratory: Negative for cough.    Gastrointestinal: Negative for constipation and diarrhea.  Genitourinary: Negative for dysuria.  Musculoskeletal: Negative for joint pain and myalgias.  Neurological: Negative for headaches.  Psychiatric/Behavioral: Negative for depression. The patient is not nervous/anxious.        Objective:    Physical Exam Constitutional:      Appearance: Normal appearance.  HENT:     Head: Normocephalic and atraumatic.     Right Ear: Tympanic membrane and external ear normal.     Left Ear: Tympanic membrane and external ear normal.  Eyes:     Extraocular Movements: Extraocular movements intact.     Pupils: Pupils are equal, round, and  reactive to light.     Comments: No nystagmus.  Cardiovascular:     Rate and Rhythm: Normal rate and regular rhythm.     Pulses: Normal pulses.     Heart sounds: Normal heart sounds.  Pulmonary:     Effort: Pulmonary effort is normal. No respiratory distress.     Breath sounds: Normal breath sounds. No stridor. No wheezing, rhonchi or rales.  Abdominal:     General: Bowel sounds are normal. There is no distension.     Palpations: Abdomen is soft. There is no mass.     Tenderness: There is no abdominal tenderness. There is no guarding or rebound.     Hernia: No hernia is present.  Musculoskeletal:     Comments: 5/5 strength in both upper and lower extremities.  Lymphadenopathy:     Cervical: No cervical adenopathy.  Skin:    General: Skin is warm and dry.  Neurological:     Mental Status: He is alert and oriented to person, place, and time.     Comments: Normal patellar reflexes.  Psychiatric:        Behavior: Behavior normal.     There were no vitals taken for this visit. Wt Readings from Last 3 Encounters:  04/22/20 189 lb 4 oz (85.8 kg)  12/14/19 186 lb (84.4 kg)  03/20/19 191 lb (86.6 kg)    Diabetic Foot Exam - Simple   No data filed    Lab Results  Component Value Date   WBC 9.2 01/10/2018   HGB 15.6 01/10/2018   HCT 46.9  01/10/2018   PLT 193 01/10/2018   GLUCOSE 140 (H) 12/14/2019   CHOL 150 04/22/2020   TRIG 103.0 04/22/2020   HDL 43.50 04/22/2020   LDLDIRECT 102.0 03/20/2019   LDLCALC 86 04/22/2020   ALT 22 12/14/2019   AST 17 12/14/2019   NA 139 12/14/2019   K 4.6 12/14/2019   CL 103 12/14/2019   CREATININE 1.02 12/14/2019   BUN 16 12/14/2019   CO2 23 12/14/2019   TSH 0.81 02/23/2017   PSA 0.32 12/14/2019   HGBA1C 6.3 (A) 04/22/2020   MICROALBUR <0.7 04/22/2020    Lab Results  Component Value Date   TSH 0.81 02/23/2017   Lab Results  Component Value Date   WBC 9.2 01/10/2018   HGB 15.6 01/10/2018   HCT 46.9 01/10/2018   MCV 89.0 01/10/2018   PLT 193 01/10/2018   Lab Results  Component Value Date   NA 139 12/14/2019   K 4.6 12/14/2019   CO2 23 12/14/2019   GLUCOSE 140 (H) 12/14/2019   BUN 16 12/14/2019   CREATININE 1.02 12/14/2019   BILITOT 0.7 12/14/2019   ALKPHOS 57 03/20/2019   AST 17 12/14/2019   ALT 22 12/14/2019   PROT 7.1 12/14/2019   ALBUMIN 4.1 03/20/2019   CALCIUM 10.0 12/14/2019   ANIONGAP 10 01/10/2018   GFR 73.77 03/20/2019   Lab Results  Component Value Date   CHOL 150 04/22/2020   Lab Results  Component Value Date   HDL 43.50 04/22/2020   Lab Results  Component Value Date   LDLCALC 86 04/22/2020   Lab Results  Component Value Date   TRIG 103.0 04/22/2020   Lab Results  Component Value Date   CHOLHDL 3 04/22/2020   Lab Results  Component Value Date   HGBA1C 6.3 (A) 04/22/2020       Assessment & Plan:   Problem List Items Addressed This Visit   None  No orders of the defined types were placed in this encounter.   I, Debbrah Alar, NP, personally preformed the services described in this documentation.  All medical record entries made by the scribe were at my direction and in my presence.  I have reviewed the chart and discharge instructions (if applicable) and agree that the record reflects my personal performance and is  accurate and complete. 06/20/2020   I,Larry Davis, acting as a Education administrator for Nance Pear, NP.,have documented all relevant documentation on the behalf of Nance Pear, NP,as directed by  Nance Pear, NP while in the presence of Nance Pear, NP.   Larry [employer]

## 2020-06-20 NOTE — Assessment & Plan Note (Addendum)
Encouraged pt to continue his work on [employer], exercise and weight loss efforts. Colo up to date. He plans to get the 2nd Covid booster today. He continues to follow with urology due to hx of prostate cancer. His SBP is above goal given his hx of diabetes. We discussed addition of ACE but he declines at this time. Prefers to continue to work on lifestyle modifications.

## 2020-06-20 NOTE — Assessment & Plan Note (Signed)
Stable with prn use of ambien 10mg . Continue same.  Controlled substance contract is updated and his UDS will be obtained.

## 2020-06-21 LAB — DRUG MONITORING, PANEL 8 WITH CONFIRMATION, URINE
6 Acetylmorphine: NEGATIVE ng/mL (ref <10)
Alcohol Metabolites: NEGATIVE ng/mL
Amphetamines: NEGATIVE ng/mL (ref <500)
Benzodiazepines: NEGATIVE ng/mL (ref <100)
Buprenorphine, Urine: NEGATIVE ng/mL (ref <5)
Cocaine Metabolite: NEGATIVE ng/mL (ref <150)
Creatinine: 78.6 mg/dL
MDMA: NEGATIVE ng/mL (ref <500)
Marijuana Metabolite: NEGATIVE ng/mL (ref <20)
Opiates: NEGATIVE ng/mL (ref <100)
Oxidant: NEGATIVE ug/mL
Oxycodone: NEGATIVE ng/mL (ref <100)
pH: 7.1 (ref 4.5–9.0)

## 2020-06-21 LAB — DM TEMPLATE

## 2020-06-26 DIAGNOSIS — N529 Male erectile dysfunction, unspecified: Secondary | ICD-10-CM | POA: Diagnosis not present

## 2020-06-26 DIAGNOSIS — Z8546 Personal history of malignant neoplasm of prostate: Secondary | ICD-10-CM | POA: Diagnosis not present

## 2020-06-26 DIAGNOSIS — Z0189 Encounter for other specified special examinations: Secondary | ICD-10-CM | POA: Diagnosis not present

## 2020-06-26 DIAGNOSIS — E119 Type 2 diabetes mellitus without complications: Secondary | ICD-10-CM | POA: Diagnosis not present

## 2020-06-26 DIAGNOSIS — G4700 Insomnia, unspecified: Secondary | ICD-10-CM | POA: Diagnosis not present

## 2020-07-02 ENCOUNTER — Encounter: Payer: Self-pay | Admitting: Family

## 2020-07-02 DIAGNOSIS — Z8546 Personal history of malignant neoplasm of prostate: Secondary | ICD-10-CM

## 2020-07-03 ENCOUNTER — Other Ambulatory Visit: Payer: Self-pay

## 2020-07-03 ENCOUNTER — Other Ambulatory Visit (INDEPENDENT_AMBULATORY_CARE_PROVIDER_SITE_OTHER): Payer: 59

## 2020-07-03 DIAGNOSIS — Z8546 Personal history of malignant neoplasm of prostate: Secondary | ICD-10-CM

## 2020-07-03 LAB — PSA: PSA: 3.18 ng/mL (ref 0.10–4.00)

## 2020-07-04 ENCOUNTER — Telehealth: Payer: Self-pay | Admitting: Family

## 2020-07-04 NOTE — Telephone Encounter (Signed)
Spoke to wife- pt was not home. She states that pt has seen his elevated PSA and is scheduled to see his Urologist- Dr. Tresa Moore, next week.

## 2020-07-25 ENCOUNTER — Other Ambulatory Visit (HOSPITAL_BASED_OUTPATIENT_CLINIC_OR_DEPARTMENT_OTHER): Payer: Self-pay

## 2020-07-25 MED FILL — Canagliflozin Tab 300 MG: ORAL | 90 days supply | Qty: 90 | Fill #0 | Status: AC

## 2020-07-25 MED FILL — Semaglutide Soln Pen-inj 0.25 or 0.5 MG/DOSE (2 MG/1.5ML): SUBCUTANEOUS | 28 days supply | Qty: 1.5 | Fill #0 | Status: AC

## 2020-07-27 ENCOUNTER — Encounter: Payer: Self-pay | Admitting: Internal Medicine

## 2020-07-28 ENCOUNTER — Other Ambulatory Visit (HOSPITAL_BASED_OUTPATIENT_CLINIC_OR_DEPARTMENT_OTHER): Payer: Self-pay

## 2020-07-28 MED ORDER — EMPAGLIFLOZIN 25 MG PO TABS
ORAL_TABLET | ORAL | 3 refills | Status: DC
  Filled 2020-07-28: qty 90, 90d supply, fill #0

## 2020-07-29 ENCOUNTER — Other Ambulatory Visit (HOSPITAL_BASED_OUTPATIENT_CLINIC_OR_DEPARTMENT_OTHER): Payer: Self-pay

## 2020-07-29 ENCOUNTER — Other Ambulatory Visit: Payer: Self-pay | Admitting: Internal Medicine

## 2020-07-29 MED ORDER — SEMAGLUTIDE(0.25 OR 0.5MG/DOS) 2 MG/1.5ML ~~LOC~~ SOPN
0.5000 mg | PEN_INJECTOR | SUBCUTANEOUS | 3 refills | Status: AC
  Filled 2020-07-29 – 2020-08-19 (×2): qty 4.5, 84d supply, fill #0
  Filled 2020-11-10: qty 4.5, 84d supply, fill #1
  Filled 2021-02-05: qty 4.5, 84d supply, fill #2
  Filled 2021-03-06: qty 4.5, 84d supply, fill #3

## 2020-08-01 DIAGNOSIS — C61 Malignant neoplasm of prostate: Secondary | ICD-10-CM | POA: Diagnosis not present

## 2020-08-04 ENCOUNTER — Encounter: Payer: Self-pay | Admitting: Family

## 2020-08-04 ENCOUNTER — Other Ambulatory Visit: Payer: Self-pay | Admitting: Internal Medicine

## 2020-08-05 NOTE — Addendum Note (Signed)
Addended by: Debbrah Alar on: 08/05/2020 12:44 PM   Modules accepted: Level of Service

## 2020-08-11 DIAGNOSIS — N393 Stress incontinence (female) (male): Secondary | ICD-10-CM | POA: Diagnosis not present

## 2020-08-11 DIAGNOSIS — N5201 Erectile dysfunction due to arterial insufficiency: Secondary | ICD-10-CM | POA: Diagnosis not present

## 2020-08-11 DIAGNOSIS — C61 Malignant neoplasm of prostate: Secondary | ICD-10-CM | POA: Diagnosis not present

## 2020-08-12 ENCOUNTER — Other Ambulatory Visit: Payer: Self-pay | Admitting: Urology

## 2020-08-12 ENCOUNTER — Other Ambulatory Visit (HOSPITAL_COMMUNITY): Payer: Self-pay | Admitting: Urology

## 2020-08-12 DIAGNOSIS — C61 Malignant neoplasm of prostate: Secondary | ICD-10-CM

## 2020-08-14 ENCOUNTER — Encounter: Payer: Self-pay | Admitting: Internal Medicine

## 2020-08-15 ENCOUNTER — Other Ambulatory Visit: Payer: Self-pay

## 2020-08-15 DIAGNOSIS — E1165 Type 2 diabetes mellitus with hyperglycemia: Secondary | ICD-10-CM

## 2020-08-15 MED ORDER — FREESTYLE LITE W/DEVICE KIT
PACK | 0 refills | Status: DC
  Filled 2020-08-15: qty 1, 30d supply, fill #0

## 2020-08-19 ENCOUNTER — Other Ambulatory Visit (HOSPITAL_BASED_OUTPATIENT_CLINIC_OR_DEPARTMENT_OTHER): Payer: Self-pay

## 2020-08-20 DIAGNOSIS — R9721 Rising PSA following treatment for malignant neoplasm of prostate: Secondary | ICD-10-CM | POA: Diagnosis not present

## 2020-08-20 DIAGNOSIS — N529 Male erectile dysfunction, unspecified: Secondary | ICD-10-CM | POA: Diagnosis not present

## 2020-08-20 DIAGNOSIS — Z8546 Personal history of malignant neoplasm of prostate: Secondary | ICD-10-CM | POA: Diagnosis not present

## 2020-08-20 DIAGNOSIS — Z9079 Acquired absence of other genital organ(s): Secondary | ICD-10-CM | POA: Diagnosis not present

## 2020-08-22 ENCOUNTER — Other Ambulatory Visit (HOSPITAL_COMMUNITY): Payer: Self-pay

## 2020-09-01 ENCOUNTER — Encounter (HOSPITAL_COMMUNITY)
Admission: RE | Admit: 2020-09-01 | Discharge: 2020-09-01 | Disposition: A | Discharge status: Home / Self Care | Payer: 59 | Source: Ambulatory Visit | Attending: Urology | Admitting: Urology

## 2020-09-01 ENCOUNTER — Other Ambulatory Visit: Payer: Self-pay

## 2020-09-01 ENCOUNTER — Ambulatory Visit (HOSPITAL_COMMUNITY)
Admission: RE | Admit: 2020-09-01 | Discharge: 2020-09-01 | Disposition: A | Discharge status: Home / Self Care | Payer: 59 | Source: Ambulatory Visit | Attending: Urology | Admitting: Urology

## 2020-09-01 DIAGNOSIS — Z96642 Presence of left artificial hip joint: Secondary | ICD-10-CM | POA: Diagnosis not present

## 2020-09-01 DIAGNOSIS — K5730 Diverticulosis of large intestine without perforation or abscess without bleeding: Secondary | ICD-10-CM | POA: Diagnosis not present

## 2020-09-01 DIAGNOSIS — N281 Cyst of kidney, acquired: Secondary | ICD-10-CM | POA: Diagnosis not present

## 2020-09-01 DIAGNOSIS — C61 Malignant neoplasm of prostate: Secondary | ICD-10-CM | POA: Diagnosis not present

## 2020-09-01 DIAGNOSIS — Z8546 Personal history of malignant neoplasm of prostate: Secondary | ICD-10-CM | POA: Diagnosis not present

## 2020-09-01 DIAGNOSIS — M160 Bilateral primary osteoarthritis of hip: Secondary | ICD-10-CM | POA: Diagnosis not present

## 2020-09-01 IMAGING — NM NM BONE WHOLE BODY
2 series · 2 of 2 positions shown · non-contrast
Comparison: CT abdomen and pelvis 09/01/2020

CLINICAL DATA: 65-year-old with prostate cancer.

EXAM:
NUCLEAR MEDICINE WHOLE BODY BONE SCAN
TECHNIQUE: Whole body anterior and posterior images were obtained approximately
3 hours after intravenous injection of radiopharmaceutical.
RADIOPHARMACEUTICALS:  21.6 mCi Hechnetium-DDm MDP IV

[Series 1: whole body · 2.66mm/px · 1 of 1 slices shown (1 of 2)]
[im 1/1]
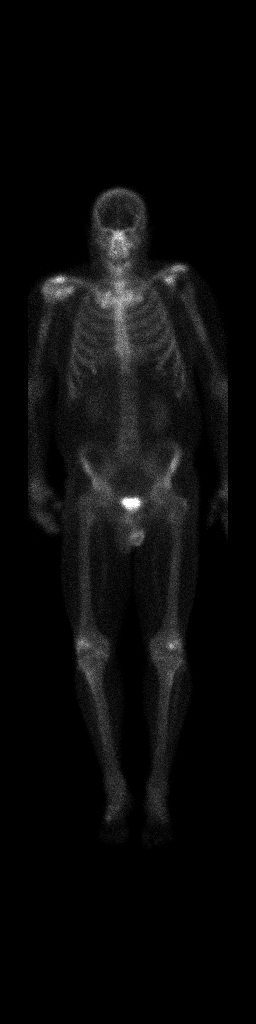

[Series 1: whole body · 2.66mm/px · 1 of 1 slices shown (2 of 2)]
[im 1/1]
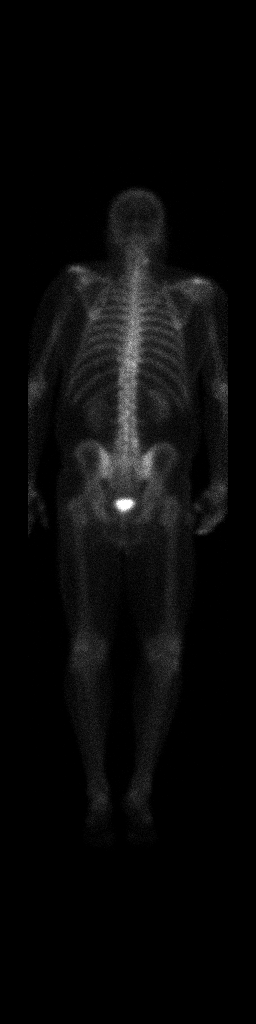

[2 of 2 positions shown; findings below may reference images not displayed]

FINDINGS: Expected changes from a left shoulder arthroplasty. Expected uptake
in the urinary bladder. No suspicious uptake involving the ribs,
axial skeleton or appendicular skeleton. Small areas of uptake in
both knees are suggestive for degenerative changes. Uptake in the
right foot is suggestive for degenerative changes.
IMPRESSION: No evidence for metastatic bone disease.

## 2020-09-01 MED ORDER — TECHNETIUM TC 99M MEDRONATE IV KIT
20.0000 | PACK | Freq: Once | INTRAVENOUS | Status: AC | PRN
  Administered 2020-09-01: 21.6 via INTRAVENOUS

## 2020-09-15 ENCOUNTER — Other Ambulatory Visit (HOSPITAL_BASED_OUTPATIENT_CLINIC_OR_DEPARTMENT_OTHER): Payer: Self-pay

## 2020-09-15 DIAGNOSIS — R9721 Rising PSA following treatment for malignant neoplasm of prostate: Secondary | ICD-10-CM | POA: Diagnosis not present

## 2020-09-15 DIAGNOSIS — N5201 Erectile dysfunction due to arterial insufficiency: Secondary | ICD-10-CM | POA: Diagnosis not present

## 2020-09-15 DIAGNOSIS — C61 Malignant neoplasm of prostate: Secondary | ICD-10-CM | POA: Diagnosis not present

## 2020-09-15 DIAGNOSIS — N393 Stress incontinence (female) (male): Secondary | ICD-10-CM | POA: Diagnosis not present

## 2020-09-15 MED ORDER — BICALUTAMIDE 50 MG PO TABS
ORAL_TABLET | ORAL | 0 refills | Status: DC
  Filled 2020-09-15: qty 30, 30d supply, fill #0

## 2020-09-16 ENCOUNTER — Other Ambulatory Visit (HOSPITAL_BASED_OUTPATIENT_CLINIC_OR_DEPARTMENT_OTHER): Payer: Self-pay

## 2020-09-19 DIAGNOSIS — H524 Presbyopia: Secondary | ICD-10-CM | POA: Diagnosis not present

## 2020-09-19 DIAGNOSIS — H2513 Age-related nuclear cataract, bilateral: Secondary | ICD-10-CM | POA: Diagnosis not present

## 2020-09-19 DIAGNOSIS — E1136 Type 2 diabetes mellitus with diabetic cataract: Secondary | ICD-10-CM | POA: Diagnosis not present

## 2020-09-20 ENCOUNTER — Encounter: Payer: Self-pay | Admitting: Internal Medicine

## 2020-09-22 ENCOUNTER — Other Ambulatory Visit: Payer: Self-pay | Admitting: Family

## 2020-09-22 ENCOUNTER — Other Ambulatory Visit (HOSPITAL_BASED_OUTPATIENT_CLINIC_OR_DEPARTMENT_OTHER): Payer: Self-pay

## 2020-09-22 DIAGNOSIS — E1165 Type 2 diabetes mellitus with hyperglycemia: Secondary | ICD-10-CM

## 2020-09-23 ENCOUNTER — Other Ambulatory Visit (HOSPITAL_BASED_OUTPATIENT_CLINIC_OR_DEPARTMENT_OTHER): Payer: Self-pay

## 2020-09-23 ENCOUNTER — Other Ambulatory Visit: Payer: Self-pay

## 2020-09-23 MED ORDER — FREESTYLE LITE TEST VI STRP
ORAL_STRIP | 6 refills | Status: AC
  Filled 2020-09-23: qty 300, 90d supply, fill #0

## 2020-09-23 MED ORDER — BLOOD GLUCOSE METER KIT: PACK | 0 refills | Status: AC

## 2020-09-23 MED ORDER — FREESTYLE LITE W/DEVICE KIT
PACK | 0 refills | Status: DC
  Filled 2020-09-23: qty 1, fill #0

## 2020-09-23 NOTE — Telephone Encounter (Signed)
New message     Patient at front desk asking can this be sent over today   1. Which medications need to be refilled? (please list name of each medication and dose if known) glucose blood (FREESTYLE LITE) test strip  2. Which pharmacy/location (including street and city if local pharmacy) is medication to be sent to?[facility]  3. Do they need a 30 day or 90 day supply? 30 day supply

## 2020-09-30 DIAGNOSIS — Z5111 Encounter for antineoplastic chemotherapy: Secondary | ICD-10-CM | POA: Diagnosis not present

## 2020-09-30 DIAGNOSIS — C61 Malignant neoplasm of prostate: Secondary | ICD-10-CM | POA: Diagnosis not present

## 2020-10-08 DIAGNOSIS — H53149 Visual discomfort, unspecified: Secondary | ICD-10-CM | POA: Diagnosis not present

## 2020-10-08 DIAGNOSIS — H524 Presbyopia: Secondary | ICD-10-CM | POA: Diagnosis not present

## 2020-11-10 ENCOUNTER — Other Ambulatory Visit (HOSPITAL_BASED_OUTPATIENT_CLINIC_OR_DEPARTMENT_OTHER): Payer: Self-pay

## 2020-11-10 ENCOUNTER — Other Ambulatory Visit: Payer: Self-pay | Admitting: Internal Medicine

## 2020-11-10 ENCOUNTER — Other Ambulatory Visit: Payer: Self-pay | Admitting: Family

## 2020-11-10 MED ORDER — ZOLPIDEM TARTRATE 10 MG PO TABS
ORAL_TABLET | Freq: Every evening | ORAL | 0 refills | Status: DC | PRN
  Filled 2020-11-10: qty 30, 30d supply, fill #0

## 2020-11-10 MED FILL — Canagliflozin Tab 300 MG: ORAL | 90 days supply | Qty: 90 | Fill #1 | Status: AC

## 2020-11-10 NOTE — Telephone Encounter (Signed)
Requesting: zolpidem 10mg  Contract: 03/20/2019 UDS: 06/20/2020 Last Visit: 06/20/2020 Next Visit: None Last Refill: 05/27/2020 #30 and 0RF  Please Advise

## 2020-11-12 ENCOUNTER — Encounter: Payer: Self-pay | Admitting: Internal Medicine

## 2020-11-12 ENCOUNTER — Other Ambulatory Visit: Payer: Self-pay

## 2020-11-12 ENCOUNTER — Other Ambulatory Visit: Payer: Self-pay | Admitting: Internal Medicine

## 2020-11-12 ENCOUNTER — Other Ambulatory Visit (HOSPITAL_BASED_OUTPATIENT_CLINIC_OR_DEPARTMENT_OTHER): Payer: Self-pay

## 2020-11-12 MED ORDER — METFORMIN HCL ER 500 MG PO TB24
500.0000 mg | ORAL_TABLET | Freq: Two times a day (BID) | ORAL | 0 refills | Status: DC
  Filled 2020-11-12: qty 180, 90d supply, fill #0

## 2020-11-12 NOTE — Telephone Encounter (Signed)
Vm and mychart message sent to patient to contact office and schedule appt

## 2020-11-13 ENCOUNTER — Other Ambulatory Visit (HOSPITAL_BASED_OUTPATIENT_CLINIC_OR_DEPARTMENT_OTHER): Payer: Self-pay

## 2020-11-13 MED ORDER — JARDIANCE 25 MG PO TABS
ORAL_TABLET | ORAL | 3 refills | Status: DC
  Filled 2020-11-13: qty 90, 90d supply, fill #0

## 2020-11-18 ENCOUNTER — Telehealth: Payer: Self-pay | Admitting: Family

## 2020-11-18 DIAGNOSIS — M65332 Trigger finger, left middle finger: Secondary | ICD-10-CM | POA: Diagnosis not present

## 2020-11-18 DIAGNOSIS — M65342 Trigger finger, left ring finger: Secondary | ICD-10-CM | POA: Diagnosis not present

## 2020-11-18 NOTE — Telephone Encounter (Signed)
Patient would like to transfer care to Dr. Nani Ravens since he is going through things that he would feel more comfortable with discussing with a male provider. Please advice.

## 2020-11-18 NOTE — Telephone Encounter (Signed)
I am happy to see him for this male-related issue if he wants, but am started to wind down on accepting new patients so will decline the transfer. Ty.

## 2020-11-19 ENCOUNTER — Other Ambulatory Visit (HOSPITAL_BASED_OUTPATIENT_CLINIC_OR_DEPARTMENT_OTHER): Payer: Self-pay

## 2020-11-19 NOTE — Telephone Encounter (Signed)
LVM for patient to call the office back. 

## 2020-11-19 NOTE — Telephone Encounter (Signed)
Patient called back and he informed that Dr. Nani Ravens is not taking new patient. He states he understood and will stay with Melissa.

## 2020-11-20 DIAGNOSIS — H53149 Visual discomfort, unspecified: Secondary | ICD-10-CM | POA: Diagnosis not present

## 2020-11-20 DIAGNOSIS — H524 Presbyopia: Secondary | ICD-10-CM | POA: Diagnosis not present

## 2020-11-24 ENCOUNTER — Other Ambulatory Visit (HOSPITAL_BASED_OUTPATIENT_CLINIC_OR_DEPARTMENT_OTHER): Payer: Self-pay

## 2020-12-03 DIAGNOSIS — M65332 Trigger finger, left middle finger: Secondary | ICD-10-CM | POA: Diagnosis not present

## 2020-12-03 DIAGNOSIS — M65342 Trigger finger, left ring finger: Secondary | ICD-10-CM | POA: Diagnosis not present

## 2020-12-03 DIAGNOSIS — M65331 Trigger finger, right middle finger: Secondary | ICD-10-CM | POA: Diagnosis not present

## 2020-12-07 ENCOUNTER — Encounter: Payer: Self-pay | Admitting: Internal Medicine

## 2020-12-09 NOTE — Telephone Encounter (Signed)
Pt scheduled appointment 11/7 12:45

## 2020-12-22 ENCOUNTER — Ambulatory Visit: Payer: 59 | Admitting: Family Medicine

## 2020-12-22 DIAGNOSIS — E119 Type 2 diabetes mellitus without complications: Secondary | ICD-10-CM | POA: Diagnosis not present

## 2020-12-22 DIAGNOSIS — Z23 Encounter for immunization: Secondary | ICD-10-CM | POA: Diagnosis not present

## 2020-12-22 DIAGNOSIS — C61 Malignant neoplasm of prostate: Secondary | ICD-10-CM | POA: Diagnosis not present

## 2020-12-22 DIAGNOSIS — G4700 Insomnia, unspecified: Secondary | ICD-10-CM | POA: Diagnosis not present

## 2020-12-23 DIAGNOSIS — C61 Malignant neoplasm of prostate: Secondary | ICD-10-CM | POA: Diagnosis not present

## 2021-01-01 DIAGNOSIS — N393 Stress incontinence (female) (male): Secondary | ICD-10-CM | POA: Diagnosis not present

## 2021-01-01 DIAGNOSIS — C61 Malignant neoplasm of prostate: Secondary | ICD-10-CM | POA: Diagnosis not present

## 2021-01-01 DIAGNOSIS — N5201 Erectile dysfunction due to arterial insufficiency: Secondary | ICD-10-CM | POA: Diagnosis not present

## 2021-01-13 ENCOUNTER — Other Ambulatory Visit: Payer: Self-pay | Admitting: Family

## 2021-01-13 DIAGNOSIS — Z760 Encounter for issue of repeat prescription: Secondary | ICD-10-CM | POA: Diagnosis not present

## 2021-01-14 ENCOUNTER — Other Ambulatory Visit: Payer: Self-pay | Admitting: Urology

## 2021-01-14 DIAGNOSIS — M859 Disorder of bone density and structure, unspecified: Secondary | ICD-10-CM

## 2021-01-15 ENCOUNTER — Other Ambulatory Visit: Payer: 59

## 2021-01-15 ENCOUNTER — Other Ambulatory Visit (HOSPITAL_BASED_OUTPATIENT_CLINIC_OR_DEPARTMENT_OTHER): Payer: Self-pay

## 2021-01-15 MED ORDER — ZOLPIDEM TARTRATE 10 MG PO TABS
ORAL_TABLET | Freq: Every evening | ORAL | 0 refills | Status: DC | PRN
  Filled 2021-01-15: qty 30, 30d supply, fill #0

## 2021-01-16 ENCOUNTER — Ambulatory Visit: Payer: 59 | Admitting: Family Medicine

## 2021-01-20 ENCOUNTER — Other Ambulatory Visit: Payer: Self-pay

## 2021-01-20 ENCOUNTER — Ambulatory Visit
Admission: RE | Admit: 2021-01-20 | Discharge: 2021-01-20 | Disposition: A | Discharge status: Home / Self Care | Payer: 59 | Source: Ambulatory Visit | Attending: Urology | Admitting: Urology

## 2021-01-20 DIAGNOSIS — M859 Disorder of bone density and structure, unspecified: Secondary | ICD-10-CM

## 2021-01-20 DIAGNOSIS — M85832 Other specified disorders of bone density and structure, left forearm: Secondary | ICD-10-CM | POA: Diagnosis not present

## 2021-02-05 ENCOUNTER — Other Ambulatory Visit: Payer: Self-pay | Admitting: Internal Medicine

## 2021-02-06 ENCOUNTER — Other Ambulatory Visit (HOSPITAL_BASED_OUTPATIENT_CLINIC_OR_DEPARTMENT_OTHER): Payer: Self-pay

## 2021-02-06 MED ORDER — METFORMIN HCL ER 500 MG PO TB24
500.0000 mg | ORAL_TABLET | Freq: Two times a day (BID) | ORAL | 0 refills | Status: DC
Start: 2021-02-06 — End: 2023-09-22
  Filled 2021-02-06 – 2021-03-27 (×5): qty 60, 30d supply, fill #0

## 2021-02-12 ENCOUNTER — Other Ambulatory Visit (HOSPITAL_BASED_OUTPATIENT_CLINIC_OR_DEPARTMENT_OTHER): Payer: Self-pay

## 2021-02-12 MED FILL — Canagliflozin Tab 300 MG: ORAL | 90 days supply | Qty: 90 | Fill #2 | Status: AC

## 2021-03-06 ENCOUNTER — Other Ambulatory Visit (HOSPITAL_BASED_OUTPATIENT_CLINIC_OR_DEPARTMENT_OTHER): Payer: Self-pay

## 2021-03-06 ENCOUNTER — Other Ambulatory Visit: Payer: Self-pay | Admitting: Internal Medicine

## 2021-03-06 ENCOUNTER — Other Ambulatory Visit: Payer: Self-pay | Admitting: Family

## 2021-03-06 NOTE — Telephone Encounter (Signed)
Requesting: zolpidem Contract: 06/20/20 UDS: 06/20/20 Last Visit: 06/20/20 Next Visit: none Last Refill: 01/15/21  Please Advise

## 2021-03-19 ENCOUNTER — Ambulatory Visit: Payer: 59 | Admitting: Internal Medicine

## 2021-03-27 ENCOUNTER — Other Ambulatory Visit (HOSPITAL_BASED_OUTPATIENT_CLINIC_OR_DEPARTMENT_OTHER): Payer: Self-pay

## 2021-03-27 MED ORDER — CARESTART COVID-19 HOME TEST VI KIT
PACK | 0 refills | Status: DC
  Filled 2021-03-27: qty 4, 4d supply, fill #0

## 2021-04-02 DIAGNOSIS — C61 Malignant neoplasm of prostate: Secondary | ICD-10-CM | POA: Diagnosis not present

## 2021-04-09 DIAGNOSIS — M859 Disorder of bone density and structure, unspecified: Secondary | ICD-10-CM | POA: Diagnosis not present

## 2021-04-09 DIAGNOSIS — Z5111 Encounter for antineoplastic chemotherapy: Secondary | ICD-10-CM | POA: Diagnosis not present

## 2021-04-09 DIAGNOSIS — N393 Stress incontinence (female) (male): Secondary | ICD-10-CM | POA: Diagnosis not present

## 2021-04-09 DIAGNOSIS — C61 Malignant neoplasm of prostate: Secondary | ICD-10-CM | POA: Diagnosis not present

## 2021-04-09 DIAGNOSIS — N5201 Erectile dysfunction due to arterial insufficiency: Secondary | ICD-10-CM | POA: Diagnosis not present

## 2021-05-05 DIAGNOSIS — M65331 Trigger finger, right middle finger: Secondary | ICD-10-CM | POA: Diagnosis not present

## 2021-05-05 DIAGNOSIS — M65342 Trigger finger, left ring finger: Secondary | ICD-10-CM | POA: Diagnosis not present

## 2021-05-05 DIAGNOSIS — M65332 Trigger finger, left middle finger: Secondary | ICD-10-CM | POA: Diagnosis not present

## 2021-05-14 ENCOUNTER — Other Ambulatory Visit (HOSPITAL_BASED_OUTPATIENT_CLINIC_OR_DEPARTMENT_OTHER): Payer: Self-pay

## 2021-05-14 DIAGNOSIS — G8918 Other acute postprocedural pain: Secondary | ICD-10-CM | POA: Diagnosis not present

## 2021-05-14 DIAGNOSIS — M65332 Trigger finger, left middle finger: Secondary | ICD-10-CM | POA: Diagnosis not present

## 2021-05-14 MED ORDER — OXYCODONE-ACETAMINOPHEN 5-325 MG PO TABS
ORAL_TABLET | ORAL | 0 refills | Status: DC
  Filled 2021-05-14: qty 5, 1d supply, fill #0

## 2021-05-20 DIAGNOSIS — Z91014 Allergy to mammalian meats: Secondary | ICD-10-CM | POA: Diagnosis not present

## 2021-05-20 DIAGNOSIS — G4700 Insomnia, unspecified: Secondary | ICD-10-CM | POA: Diagnosis not present

## 2021-05-20 DIAGNOSIS — I10 Essential (primary) hypertension: Secondary | ICD-10-CM | POA: Diagnosis not present

## 2021-05-20 DIAGNOSIS — E119 Type 2 diabetes mellitus without complications: Secondary | ICD-10-CM | POA: Diagnosis not present

## 2021-06-01 ENCOUNTER — Other Ambulatory Visit (HOSPITAL_BASED_OUTPATIENT_CLINIC_OR_DEPARTMENT_OTHER): Payer: Self-pay

## 2021-06-01 MED ORDER — TRAMADOL HCL 50 MG PO TABS
ORAL_TABLET | ORAL | 0 refills | Status: DC
  Filled 2021-06-01: qty 6, 1d supply, fill #0

## 2021-06-01 MED ORDER — ONDANSETRON 4 MG PO TBDP
ORAL_TABLET | ORAL | 0 refills | Status: DC
  Filled 2021-06-01: qty 6, 2d supply, fill #0

## 2021-06-01 MED ORDER — AMOXICILLIN 250 MG PO CAPS
ORAL_CAPSULE | ORAL | 0 refills | Status: DC
  Filled 2021-06-01: qty 15, 5d supply, fill #0

## 2021-08-28 ENCOUNTER — Other Ambulatory Visit (HOSPITAL_COMMUNITY): Payer: Self-pay

## 2021-09-18 LAB — HM DIABETES EYE EXAM

## 2021-10-30 ENCOUNTER — Other Ambulatory Visit: Payer: Self-pay | Admitting: Urology

## 2021-11-06 ENCOUNTER — Telehealth: Payer: Self-pay | Admitting: Family

## 2021-11-06 NOTE — Telephone Encounter (Signed)
Called back and left voice mail for Zona to be aware Mr. Fonhill has not been seen by pcp since June 2022 and I left him a voice mail to call and schedule appointment.

## 2021-11-06 NOTE — Telephone Encounter (Signed)
Larry Davis from [facility] calling to follow up medical clearance that was faxed on 10/30/2021 at 11:07 am. Please call Larry Davis and advise status of form. [phone] ext [phone]

## 2021-11-11 ENCOUNTER — Encounter: Payer: Self-pay | Admitting: Family

## 2021-11-13 NOTE — Telephone Encounter (Signed)
Appointment for surgical clearance 11-17-21

## 2021-11-17 ENCOUNTER — Ambulatory Visit (HOSPITAL_BASED_OUTPATIENT_CLINIC_OR_DEPARTMENT_OTHER)
Admission: RE | Admit: 2021-11-17 | Discharge: 2021-11-17 | Disposition: A | Discharge status: Home / Self Care | Payer: No Typology Code available for payment source | Source: Ambulatory Visit | Attending: Family | Admitting: Family

## 2021-11-17 ENCOUNTER — Ambulatory Visit (INDEPENDENT_AMBULATORY_CARE_PROVIDER_SITE_OTHER): Payer: No Typology Code available for payment source | Admitting: Family

## 2021-11-17 ENCOUNTER — Encounter: Payer: Self-pay | Admitting: *Deleted

## 2021-11-17 ENCOUNTER — Telehealth: Payer: Self-pay | Admitting: Family

## 2021-11-17 VITALS — BP 129/64 | HR 72 | Temp 97.7°F | Resp 18 | Ht 68.0 in | Wt 184.4 lb

## 2021-11-17 DIAGNOSIS — Z01818 Encounter for other preprocedural examination: Secondary | ICD-10-CM | POA: Insufficient documentation

## 2021-11-17 DIAGNOSIS — G4700 Insomnia, unspecified: Secondary | ICD-10-CM

## 2021-11-17 DIAGNOSIS — Z91038 Other insect allergy status: Secondary | ICD-10-CM | POA: Diagnosis not present

## 2021-11-17 DIAGNOSIS — T7800XA Anaphylactic reaction due to unspecified food, initial encounter: Secondary | ICD-10-CM

## 2021-11-17 DIAGNOSIS — E785 Hyperlipidemia, unspecified: Secondary | ICD-10-CM

## 2021-11-17 DIAGNOSIS — E1165 Type 2 diabetes mellitus with hyperglycemia: Secondary | ICD-10-CM

## 2021-11-17 DIAGNOSIS — R059 Cough, unspecified: Secondary | ICD-10-CM

## 2021-11-17 DIAGNOSIS — C61 Malignant neoplasm of prostate: Secondary | ICD-10-CM

## 2021-11-17 LAB — HEMOGLOBIN A1C: Hemoglobin A1C: 6

## 2021-11-17 MED ORDER — ALBUTEROL SULFATE HFA 108 (90 BASE) MCG/ACT IN AERS: 2.0000 | INHALATION_SPRAY | Freq: Four times a day (QID) | RESPIRATORY_TRACT | 0 refills | Status: AC | PRN

## 2021-11-17 NOTE — Assessment & Plan Note (Signed)
Patient is following with Dr. Tresa Moore, reports that his PSA has risen slightly and Recently started new treatment with darolutamide which is being covered by the [facility]

## 2021-11-17 NOTE — Assessment & Plan Note (Signed)
Feels well, suspect that this is due to allergies/mild asthma.  Check CXR, rx given for prn albuterol.

## 2021-11-17 NOTE — Progress Notes (Addendum)
Subjective:   By signing my name below, I, Carylon Perches, attest that this documentation has been prepared under the direction and in the presence of Karie Chimera, NP 11/17/2021   Patient ID: Larry Davis, male    DOB: 18-Sep-1955, 66 y.o.   MRN: [id number]  Chief Complaint  Patient presents with   surgical clearance    Concerns/ questions: none Flu shot today: declines     HPI Patient is in today for an office visit  Cough: He reports of a cough that appeared about two weeks ago. He does not believe that his breathing is tight. He denies of a cold or fever.   Cholesterol: His cholesterol levels are normal Lab Results  Component Value Date   CHOL 150 04/22/2020   HDL 43.50 04/22/2020   LDLCALC 86 04/22/2020   LDLDIRECT 102.0 03/20/2019   TRIG 103.0 04/22/2020   CHOLHDL 3 04/22/2020   A1C: His A1C levels are controlled. He is going to the [place] for regular follow up of his sugars  Lab Results  Component Value Date   HGBA1C 6.3 (A) 04/22/2020   Prostate CA: His PSA levels are elevating. He is following with Urology.   Liver Function: His liver functions are normal   Kidney Function: His kidney functions are normal  Immunizations: He is not interested in an influenza vaccine during today's visit  Vision: He is UTD on vision exams. He reports that his last vision exam was about six months ago.  Colonoscopy: He reports that he is scheduled for a Colonoscopy in 01/2022  Pre-operative evaluation- pt is scheduled for an upcoming inflatable penile prosthesis.     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,3-galactose    Diabetes mellitus without complication (HCC)    PONV (postoperative nausea and vomiting)    Prostate cancer ([place]) 03/2014   Urticaria     Past Surgical History:  Procedure Laterality Date   ADENOIDECTOMY     CHOLECYSTECTOMY  2000   COLONOSCOPY     LYMPHADENECTOMY Bilateral 06/14/2014   Procedure: PELVIC LYMPH NODE DISSECTION;  Surgeon: Alexis Frock, MD;  [facility];  Service: Urology;  Laterality: Bilateral;   REVERSE SHOULDER ARTHROPLASTY Left 01/19/2018   REVERSE SHOULDER ARTHROPLASTY Left 01/19/2018   Procedure: REVERSE SHOULDER ARTHROPLASTY;  Surgeon: Justice Britain, MD;  Location: [facility];  Service: Orthopedics;  Laterality: Left;  128mn  Please move 7:30am [place] to 10am   ROBOT ASSISTED LAPAROSCOPIC RADICAL PROSTATECTOMY N/A 06/14/2014   Procedure: ROBOTIC ASSISTED LAPAROSCOPIC RADICAL PROSTATECTOMY WITH INDOCYANINE GREEN DYE INJECTION;  Surgeon: TAlexis Frock MD;  [facility];  Service: Urology;  Laterality: N/A;   SHOULDER SURGERY Right 1990   arthroscopy   TONSILLECTOMY      Family History  Problem Relation Age of Onset   Heart disease Mother    Diabetes Mother    Arthritis Mother    Hypertension Mother    Heart disease Father    Hypertension Father    Diabetes Father  Arthritis Father    Cancer Maternal Uncle        prostate   Cancer Maternal Grandmother        cervical    Social History   Socioeconomic History   Marital status: Married    Spouse name: Not on file   Number of children: Not on file   Years of education: Not on file   Highest education level: Not on file  Occupational History   Not on file  Tobacco Use   Smoking status: Former    Packs/day: 0.50    Years: 10.00    Total pack years: 5.00    Types: Cigarettes    Quit date: 02/15/1978    Years since quitting: 43.7   Smokeless tobacco: Never  Vaping Use   Vaping Use: Never used  Substance  and Sexual Activity   Alcohol use: No    Alcohol/week: 0.0 standard drinks of alcohol   Drug use: No   Sexual activity: Not on file  Other Topics Concern   Not on file  Social History Narrative   2 children Son and daughter 3 biological grandchildren- live in [place]   Wife has 4 children   Married   Futures trader- retired since 11/18   Has masters degree   Enjoys Museum/gallery exhibitions officer    Social Determinants of Radio broadcast assistant Strain: Not on file  Food Insecurity: Not on file  Transportation Needs: Not on file  Physical Activity: Not on file  Stress: Not on file  Social Connections: Not on file  Intimate Partner Violence: Not on file    Outpatient Medications Prior to Visit  Medication Sig Dispense Refill   blood glucose meter kit and supplies Dispense based on patient and insurance preference. Use 2 times daily as directed. 1 each 0   EPINEPHrine 0.3 mg/0.3 mL IJ SOAJ injection Inject 0.3 mg into the muscle once as needed for up to 1 dose for anaphylaxis. 2 each 2   glucose blood (FREESTYLE LITE) test strip USE AS DIRECTED TO CHECK BLOOD SUGAR UP TO 3 TIMES DAILY 300 strip 6   metFORMIN (GLUCOPHAGE-XR) 500 MG 24 hr tablet Take 1 tablet (500 mg total) by mouth in the morning and at bedtime. 60 tablet 0   Semaglutide,0.25 or 0.5MG/DOS, 2 MG/1.5ML SOPN Inject 0.5 mg into the skin once a week. 4.5 mL 3   traMADol (ULTRAM) 50 MG tablet Take 1 tablet by mouth every 4-6 hours as needed for pain 6 tablet 0   amoxicillin (AMOXIL) 250 MG capsule Take 1 capsule by mouth 3 times daily 15 capsule 0   bicalutamide (CASODEX) 50 MG tablet Take 1 tablet by mouth everyday to prevent "androgen flair" 30 tablet 0   Blood Glucose Monitoring Suppl (FREESTYLE LITE) DEVI Use as directed 1 each 0   Blood Glucose Monitoring Suppl (FREESTYLE LITE) w/Device KIT Use to check blood sugars daily 1 kit 0   COVID-19 At Home Antigen Test (CARESTART COVID-19 HOME TEST) KIT Use as directed per package  instructions 4 each 0   empagliflozin (JARDIANCE) 25 MG TABS tablet Take 1 tablet by mouth everyday 90 tablet 3   empagliflozin (JARDIANCE) 25 MG TABS tablet Take 1 tablet by mouth daily 90 tablet 3   Lancets (FREESTYLE) lancets USE TO CHECK BLOOD SUGARS UP TO 3 TIMES DAILY 100 each 12   ondansetron (ZOFRAN-ODT) 4 MG disintegrating tablet Dissolve 1 tablet on the top of the tongue every 8 hours as needed for nausea  or vomiting 6 tablet 0   oxyCODONE-acetaminophen (PERCOCET) 5-325 MG tablet Take 1 tablet by mouth once every 6 hours 5 tablet 0   zolpidem (AMBIEN) 10 MG tablet TAKE 1 TABLET (10 MG TOTAL) BY MOUTH AT BEDTIME AS NEEDED FOR SLEEP. 30 tablet 0   No facility-administered medications prior to visit.    Allergies  Allergen Reactions   Bee Venom Anaphylaxis and Other (See Comments)    Throat swells   Beef-Derived Products Anaphylaxis    Review of Systems  Constitutional:  Negative for fever.       (-) Cold       Objective:    Physical Exam Constitutional:      General: He is not in acute distress.    Appearance: Normal appearance. He is not ill-appearing.  HENT:     Head: Normocephalic and atraumatic.     Right Ear: External ear normal.     Left Ear: External ear normal.  Eyes:     Extraocular Movements: Extraocular movements intact.     Pupils: Pupils are equal, round, and reactive to light.  Cardiovascular:     Rate and Rhythm: Normal rate and regular rhythm.     Pulses:          Dorsalis pedis pulses are 2+ on the right side and 2+ on the left side.       Posterior tibial pulses are 2+ on the right side and 2+ on the left side.     Heart sounds: Normal heart sounds. No murmur heard.    No gallop.  Pulmonary:     Effort: Pulmonary effort is normal. No respiratory distress.     Breath sounds: Examination of the right-lower field reveals wheezing. Examination of the left-lower field reveals wheezing. Wheezing (Left Inspiratory) present. No rales.  Skin:     General: Skin is warm and dry.  Neurological:     Mental Status: He is alert and oriented to person, place, and time.  Psychiatric:        Mood and Affect: Mood normal.        Behavior: Behavior normal.        Judgment: Judgment normal.     BP 129/64 (BP Location: Left Arm, Patient Position: Sitting, Cuff Size: Normal)   Pulse 72   Temp 97.7 F (36.5 C) (Temporal)   Resp 18   Ht _0  (1.727 m)   Wt 184 lb 6.4 oz (83.6 kg)   SpO2 99%   BMI 28.04 kg/m  Wt Readings from Last 3 Encounters:  11/17/21 184 lb 6.4 oz (83.6 kg)  06/20/20 179 lb (81.2 kg)  04/22/20 189 lb 4 oz (85.8 kg)   Diabetic Foot Exam - Simple   No data filed        Assessment & Plan:   Problem List Items Addressed This Visit       Unprioritized   Type 2 diabetes mellitus with hyperglycemia, without long-term current use of insulin (HCC) (Chronic)    A[facility]C is at goal. [facility]       [facility]state [facility])    Patient is following with Dr. Tresa Moore, reports that his PSA has risen slightly and Recently started new treatment with darolutamide which is being covered by the [facility].       Pre-op evaluation - Primary    Reviewed CMET, CBC, Lipid panel, A1C which were drawn at the [facility] this morning. All stable.  Will obtain CXR. EKG tracing  is personally reviewed.  EKG notes NSR.  No acute changes.        Relevant Orders   DG Chest 2 View   EKG 12-Lead (Completed)   Insomnia    Stable. He is being treated with Trazodone. No longer using ambien.       Hyperlipidemia    LDL is 79 per [facility] labs this AM. Patient is not currently on statin.       Cough    Feels well, suspect that this is due to allergies/mild asthma.  Check CXR, rx given for prn albuterol.       Allergy with anaphylaxis due to food    Has updated epipen.       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       Meds ordered this encounter  Medications   albuterol (VENTOLIN HFA) 108 (90 Base) MCG/ACT inhaler     Sig: Inhale 2 puffs into the lungs every 6 (six) hours as needed for wheezing or shortness of breath.    Dispense:  8 g    Refill:  0    Order Specific Question:   Supervising Provider    Answer:   Penni Homans A [[id number]]    I, Nance Pear, NP, personally preformed the services described in this documentation.  All medical record entries made by the scribe were at my direction and in my presence.  I have reviewed the chart and discharge instructions (if applicable) and agree that the record reflects my personal performance and is accurate and complete. 11/17/2021   I,Amber Collins,acting as a scribe for Nance Pear, NP.,have documented all relevant documentation on the behalf of Nance Pear, NP,as directed by  Nance Pear, NP while in the presence of Nance Pear, NP.    Nance Pear, NP   Addendum:  reviewed CXR/Lab work. No medical indication to planned urological surgery.

## 2021-11-17 NOTE — Assessment & Plan Note (Signed)
Reviewed CMET, CBC, Lipid panel, A1C which were drawn at the [facility] this morning. All stable.  Will obtain CXR. EKG tracing is personally reviewed.  EKG notes NSR.  No acute changes.

## 2021-11-17 NOTE — Telephone Encounter (Signed)
Lab entered

## 2021-11-17 NOTE — Assessment & Plan Note (Signed)
Stable. He is being treated with Trazodone. No longer using ambien.

## 2021-11-17 NOTE — Assessment & Plan Note (Signed)
Has updated epipen.   

## 2021-11-17 NOTE — Telephone Encounter (Signed)
Can you please abstract his A1C from 10/3 at the [facility]= 6.0.

## 2021-11-17 NOTE — Assessment & Plan Note (Signed)
A1C is at goal. [facility] is [facility].

## 2021-11-17 NOTE — Assessment & Plan Note (Signed)
LDL is 79 per [facility] labs this AM. Patient is not currently on statin.

## 2021-11-20 NOTE — Telephone Encounter (Signed)
Form given to me by provider. Forms completed and faxed. Forms placed on Ruth's desk

## 2021-11-22 ENCOUNTER — Other Ambulatory Visit: Payer: Self-pay

## 2021-11-22 ENCOUNTER — Ambulatory Visit
Admission: RE | Admit: 2021-11-22 | Discharge: 2021-11-22 | Disposition: A | Discharge status: Home / Self Care | Payer: Medicare Other | Source: Ambulatory Visit | Attending: Family Medicine | Admitting: Family Medicine

## 2021-11-22 VITALS — BP 114/68 | HR 72 | Temp 99.4°F | Resp 19

## 2021-11-22 DIAGNOSIS — R059 Cough, unspecified: Secondary | ICD-10-CM

## 2021-11-22 DIAGNOSIS — J309 Allergic rhinitis, unspecified: Secondary | ICD-10-CM

## 2021-11-22 MED ORDER — FEXOFENADINE HCL 180 MG PO TABS: 180.0000 mg | ORAL_TABLET | Freq: Every day | ORAL | 0 refills | Status: AC

## 2021-11-22 MED ORDER — PREDNISONE 10 MG (21) PO TBPK: ORAL_TABLET | Freq: Every day | ORAL | 0 refills | Status: DC

## 2021-11-22 MED ORDER — DOXYCYCLINE HYCLATE 100 MG PO CAPS: 100.0000 mg | ORAL_CAPSULE | Freq: Two times a day (BID) | ORAL | 0 refills | Status: AC

## 2021-11-22 MED ORDER — HYDROCOD POLI-CHLORPHE POLI ER 10-8 MG/5ML PO SUER: 5.0000 mL | Freq: Three times a day (TID) | ORAL | 0 refills | Status: AC | PRN

## 2021-11-22 NOTE — ED Provider Notes (Signed)
[facility]    CSN: [id number] Arrival date & time: 11/22/21  1418      History   Chief Complaint Chief Complaint  Patient presents with   Cough    HPI [name] is a 66 y.o. male.   HPI Pleasant 56-YEAR-OLD MALE PRESENTS WITH COUGH AND COLD FOR 3 WEEKS.  REPORTS WAS EVALUATED BY HIS PCP LAST WEEK FOR COUGH.  PMH significant for Z9DJ without complication, prostate cancer and cough.  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   Rotator cuff tear arthropathy 05/30/2017   Abnormal electrocardiogram (ECG) (EKG) 03/14/2017   Overweight (BMI 25.0-29.9) 09/23/2016   Allergy with anaphylaxis due to food 02/25/2016   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 02/25/2016   Shoulder joint pain 08/15/2015   Hyperlipidemia 03/18/2015   Prostate cancer ([place]) 05/12/2014   Perennial allergic rhinitis 05/12/2014   Insomnia 05/12/2014   Type 2 diabetes mellitus with hyperglycemia, without long-term current use of insulin ([place]) 05/11/2014    Past Surgical History:  Procedure Laterality Date   ADENOIDECTOMY     CHOLECYSTECTOMY  2000   COLONOSCOPY     LYMPHADENECTOMY Bilateral 06/14/2014   Procedure: PELVIC LYMPH NODE DISSECTION;  Surgeon: Alexis Frock, MD;  [facility];  Service: Urology;  Laterality: Bilateral;   REVERSE SHOULDER ARTHROPLASTY Left 01/19/2018   REVERSE SHOULDER ARTHROPLASTY Left 01/19/2018   Procedure: REVERSE SHOULDER ARTHROPLASTY;  Surgeon: Justice Britain, MD;  Location: [facility];  Service: Orthopedics;  Laterality: Left;  186mn  Please move 7:30am [place] to 10am   ROBOT  ASSISTED LAPAROSCOPIC RADICAL PROSTATECTOMY N/A 06/14/2014   Procedure: ROBOTIC ASSISTED LAPAROSCOPIC RADICAL PROSTATECTOMY WITH INDOCYANINE GREEN DYE INJECTION;  Surgeon: TAlexis Frock MD;  [facility];  Service: Urology;  Laterality: N/A;   SHOULDER SURGERY Right 1990   arthroscopy   TONSILLECTOMY         Home Medications    Prior to Admission medications   Medication Sig Start Date End Date Taking? Authorizing Provider  albuterol (VENTOLIN HFA) 108 (90 Base) MCG/ACT inhaler Inhale 2 puffs into the lungs every 6 (six) hours as needed for wheezing or shortness of breath. 11/17/21  Yes ODebbrah Alar NP  blood glucose meter kit and supplies Dispense based on patient and insurance preference. Use 2 times daily as directed. 09/23/20  Yes Shamleffer, IMelanie Crazier MD  EPINEPHrine 0.3 mg/0.3 mL IJ SOAJ injection Inject 0.3 mg into the muscle once as needed for up to 1 dose for anaphylaxis. 11/07/19  Yes ODebbrah Alar NP  glucose blood (FREESTYLE LITE) test strip USE AS DIRECTED TO CHECK BLOOD SUGAR UP TO 3 TIMES DAILY 09/23/20  Yes Shamleffer, IMelanie Crazier MD  metFORMIN (GLUCOPHAGE-XR) 500 MG 24 hr tablet Take 1 tablet (500 mg total) by mouth in the morning and at bedtime. 02/06/21 02/06/22 Yes Shamleffer, IMelanie Crazier MD  Semaglutide,0.25 or 0.5MG/DOS, 2 MG/1.5ML SOPN Inject 0.5 mg into the skin once a week. 07/29/20  Yes Shamleffer, IMelanie Crazier MD  traMADol (ULTRAM) 50 MG tablet Take 1 tablet by mouth every 4-6 hours as needed for pain 06/01/21  Yes   chlorpheniramine-HYDROcodone (TUSSIONEX) 10-8 MG/5ML Take 5 mLs by mouth every 8 (eight) hours as needed for up to 7 days for cough. 11/22/21 11/29/21 Yes Eliezer Lofts, FNP  doxycycline (VIBRAMYCIN) 100 MG capsule Take 1 capsule (100 mg total) by mouth 2 (two) times daily for 10 days. 11/22/21 12/02/21 Yes Eliezer Lofts, FNP  fexofenadine [facility] ALLERGY) 180 MG tablet Take 1 tablet (180 mg total) by mouth daily for 15  days. 11/22/21 12/07/21 Yes Eliezer Lofts, FNP  predniSONE (STERAPRED UNI-PAK 21 TAB) 10 MG (21) TBPK tablet Take by mouth daily. Take 6 tabs by mouth daily  for 2 days, then 5 tabs for 2 days, then 4 tabs for 2 days, then 3 tabs for 2 days, 2 tabs for 2 days, then 1 tab by mouth daily for 2 days 11/22/21  Yes Eliezer Lofts, FNP    Family History Family History  Problem Relation Age of Onset   Heart disease Mother    Diabetes Mother    Arthritis Mother    Hypertension Mother    Heart disease Father    Hypertension Father    Diabetes Father    Arthritis Father    Cancer Maternal Uncle        prostate   Cancer Maternal Grandmother        cervical    Social History Social History   Tobacco Use   Smoking status: Former    Packs/day: 0.50    Years: 10.00    Total pack years: 5.00    Types: Cigarettes    Quit date: 02/15/1978    Years since quitting: 43.7   Smokeless tobacco: Never  Vaping Use   Vaping Use: Never used  Substance Use Topics   Alcohol use: No    Alcohol/week: 0.0 standard drinks of alcohol   Drug use: No     Allergies   Bee venom and Beef-derived products   Review of Systems Review of Systems  Respiratory:  Positive for cough.   All other systems reviewed and are negative.    Physical Exam Triage Vital Signs ED Triage Vitals  Enc Vitals Group     BP      Pulse      Resp      Temp      Temp src      SpO2      Weight      Height      Head Circumference      Peak Flow      Pain Score      Pain Loc      Pain Edu?      Excl. in [place]?    No data found.  Updated Vital Signs BP 114/68 (BP Location: Left Arm)   Pulse 72   Temp 99.4 F (37.4 C) (Oral)   Resp 19   SpO2 96%      Physical Exam Vitals and nursing note reviewed.  Constitutional:      General: He is not in acute distress.    Appearance: Normal appearance. He is normal weight. He is ill-appearing.  HENT:     Head: Normocephalic and atraumatic.     Right Ear: Tympanic  membrane, ear canal and external ear normal.     Left Ear: Tympanic membrane, ear canal and external ear normal.     Mouth/Throat:     Mouth: Mucous membranes are moist.     Pharynx: Oropharynx is clear.     Comments: Significant amount  of clear drainage of posterior oropharynx noted Eyes:     Extraocular Movements: Extraocular movements intact.     Conjunctiva/sclera: Conjunctivae normal.     Pupils: Pupils are equal, round, and reactive to light.  Cardiovascular:     Rate and Rhythm: Normal rate and regular rhythm.     Pulses: Normal pulses.     Heart sounds: Normal heart sounds.  Pulmonary:     Effort: Pulmonary effort is normal.     Breath sounds: Normal breath sounds. No stridor. No wheezing, rhonchi or rales.     Comments: Frequent nonproductive cough noted on exam Chest:     Chest wall: No tenderness.  Musculoskeletal:        General: Normal range of motion.     Cervical back: Normal range of motion and neck supple.  Skin:    General: Skin is warm and dry.  Neurological:     General: No focal deficit present.     Mental Status: He is alert and oriented to person, place, and time.      UC Treatments / Results  Labs (all labs ordered are listed, but only abnormal results are displayed) Labs Reviewed - No data to display  EKG   Radiology No results found.  Procedures Procedures (including critical care time)  Medications Ordered in UC Medications - No data to display  Initial Impression / Assessment and Plan / UC Course  I have reviewed the triage vital signs and the nursing notes.  Pertinent labs & imaging results that were available during my care of the patient were reviewed by me and considered in my medical decision making (see chart for details).     MDM: 1.  Cough-Rx Doxycycline, Tussionex; 2.  Allergic rhinitis-Rx'd Allegra. Advised patient to take medication as directed with food to completion.  Advised patient to take Sterapred Unipak and Allegra  with first dose of Doxycycline for the next 10 days.  Advised patient may use Tussionex for cough prior to sleep due to sedative effects.  Encouraged patient to increase daily water intake to 64 ounces per day while taking these medications.  Advised if symptoms worsen and/or unresolved please follow-up PCP or here for further evaluation.  Patient discharged home, hemodynamically stable. Final Clinical Impressions(s) / UC Diagnoses   Final diagnoses:  Cough, unspecified type  Allergic rhinitis, unspecified seasonality, unspecified trigger     Discharge Instructions      Advised patient to take medication as directed with food to completion.  Advised patient to take Sterapred Unipak and Allegra with first dose of Doxycycline for the next 10 days.  Advised patient may use Tussionex for cough prior to sleep due to sedative effects.  Encouraged patient to increase daily water intake to 64 ounces per day while taking these medications.  Advised if symptoms worsen and/or unresolved please follow-up PCP or here for further evaluation.     ED Prescriptions     Medication Sig Dispense Auth. Provider   doxycycline (VIBRAMYCIN) 100 MG capsule Take 1 capsule (100 mg total) by mouth 2 (two) times daily for 10 days. 20 capsule Eliezer Lofts, FNP   fexofenadine [facility] ALLERGY) 180 MG tablet Take 1 tablet (180 mg total) by mouth daily for 15 days. 15 tablet Eliezer Lofts, FNP   predniSONE (STERAPRED UNI-PAK 21 TAB) 10 MG (21) TBPK tablet Take by mouth daily. Take 6 tabs by mouth daily  for 2 days, then 5 tabs for 2 days, then 4 tabs for 2 days, then  3 tabs for 2 days, 2 tabs for 2 days, then 1 tab by mouth daily for 2 days 42 tablet Eliezer Lofts, FNP   chlorpheniramine-HYDROcodone (TUSSIONEX) 10-8 MG/5ML Take 5 mLs by mouth every 8 (eight) hours as needed for up to 7 days for cough. 100 mL Eliezer Lofts, FNP      I have reviewed the PDMP during this encounter.   Eliezer Lofts, [place] 11/22/21  1530

## 2021-11-22 NOTE — Discharge Instructions (Addendum)
Advised patient to take medication as directed with food to completion.  Advised patient to take Sterapred Unipak and Allegra with first dose of Doxycycline for the next 10 days.  Advised patient may use Tussionex for cough prior to sleep due to sedative effects.  Encouraged patient to increase daily water intake to 64 ounces per day while taking these medications.  Advised if symptoms worsen and/or unresolved please follow-up PCP or here for further evaluation.

## 2021-11-22 NOTE — ED Triage Notes (Signed)
Patient presents to Urgent Care with complaints of persistent cough since 3 weeks ago. Patient reports having a chest xray and was normal. [facility] gave Tessalon Perles didn't help. He states the cough is getting worst. Talking increases coughing spells. Nonproductive cough.

## 2021-11-23 ENCOUNTER — Telehealth: Payer: Self-pay | Admitting: Emergency Medicine

## 2021-11-23 NOTE — Telephone Encounter (Signed)
Spoke with patient states he's about the same.  Patient will continue current regimen and follow up as needed.

## 2021-12-03 ENCOUNTER — Other Ambulatory Visit (HOSPITAL_BASED_OUTPATIENT_CLINIC_OR_DEPARTMENT_OTHER): Payer: Self-pay

## 2021-12-03 MED ORDER — NYSTATIN 100000 UNIT/ML MT SUSP
OROMUCOSAL | 0 refills | Status: DC
  Filled 2021-12-03: qty 473, 10d supply, fill #0

## 2021-12-20 NOTE — Progress Notes (Signed)
COVID Vaccine received:  _0  No _1  Yes Date of any COVID positive Test in last 90 days:  PCP - Debbrah Alar, NP Cardiologist - none  Chest x-ray - 11-18-21 Epic EKG -  11-17-21 Epic Stress Test - n/a ECHO - n/a Cardiac Cath -n/a   PCR screen: _2  Ordered & Completed                      _3   No Order but Needs PROFEND                      _4   N/A for this surgery  Surgery Plan:  _5  Ambulatory                            _6  Outpatient in bed                            _7  Admit  Anesthesia:    _8  General  _9  Spinal                           _10   Choice _11   MAC  Bowel Prep - _12  No  _13   Yes _____________  Pacemaker / ICD device _14  No _15  Yes        Device order form faxed _16  No    _17   Yes      Faxed to:  Spinal Cord Stimulator:_18  No _19  Yes      (Remind patient to bring remote DOS) Other Implants:   History of Sleep Apnea? _20  No _21  Yes   CPAP used?- _22  No _23  Yes    Does the patient monitor blood sugar? _24  No _25  Yes  _26  N/A Does patient have a [employer] or Dexacom? _27  No _28  Yes   Fasting Blood Sugar Ranges-  Checks Blood Sugar _____ times a day  Last dose of GLP1 agonist- Semaglutide q Sunday GLP1 instructions: No injection on Sunday 01-10-22 Last dose of SGLT-2 inhibitors- Jardiance: SGLT-2 instructions: Hold Monday 01-11-22 and DOS 01-12-22  Metformin- Take usual dose on Monday 01-11-22 but do not take on DOS   Blood Thinner / Instructions:none Aspirin Instructions:  ERAS Protocol Ordered: _29  No  _30  Yes PRE-SURGERY _31  ENSURE  _32  G2  _33  No Drink Ordered  Patient is to be NPO after: Midnight prior  Comments:   Activity level: Patient can / can not climb a flight of stairs without difficulty; _34  No CP  _35  No SOB, but would have ______   Patient can / can not perform ADLs without assistance.   Anesthesia review: DMS, PONV, PTSD, Also sees [place] in [place]  Patient denies shortness of breath, fever, cough and chest pain at PAT  appointment.  Patient verbalized understanding and agreement to the Pre-Surgical Instructions that were given to them at this PAT appointment. Patient was also educated of the need to review these PAT instructions again prior to his/her surgery.I reviewed the appropriate phone numbers to call if they have any and questions or concerns.

## 2021-12-20 NOTE — Patient Instructions (Addendum)
SURGICAL WAITING ROOM VISITATION Patients having surgery or a procedure may have no more than 2 support people in the waiting area - these visitors may rotate in the visitor waiting room.   Children under the age of 27 must have an adult with them who is not the patient. If the patient needs to stay at the hospital during part of their recovery, the visitor guidelines for inpatient rooms apply.  PRE-OP VISITATION  Pre-op nurse will coordinate an appropriate time for 1 support person to accompany the patient in pre-op.  This support person may not rotate.  This visitor will be contacted when the time is appropriate for the visitor to come back in the pre-op area.  Please refer to the [facility] website for the visitor guidelines for Inpatients (after your surgery is over and you are in a regular room).  You are not required to quarantine at this time prior to your surgery. However, you must do this: Hand Hygiene often Do NOT share personal items Notify your provider if you are in close contact with someone who has COVID or you develop fever 100.4 or greater, new onset of sneezing, cough, sore throat, shortness of breath or body aches.  If you test positive for Covid or have been in contact with anyone that has tested positive in the last 10 days please notify you surgeon.    Your procedure is scheduled on:  Tuesday   January 12, 2022  Report to [facility] Main Entrance: [place] entrance where the [employer] is available.   Report to admitting at: 11:45     AM  +++++Call this number if you have any questions or problems the morning of surgery [phone]  Do not eat or Drink anything after Midnight the night prior to your surgery/procedure.  FOLLOW BOWEL PREP AND ANY ADDITIONAL PRE OP INSTRUCTIONS YOU RECEIVED FROM YOUR SURGEON'S OFFICE!!!   Oral Hygiene is also important to reduce your risk of infection.        Remember - BRUSH YOUR TEETH THE MORNING OF SURGERY WITH YOUR  REGULAR TOOTHPASTE  DIABETIC MEDICINE:        [place] (Semaglutide) Do NOT inject this on Sunday 01-10-22, resume the Sunday AFTER your surgery      JARDIANCE (empagliflozin) Do NOT take on Monday 01-11-22 and Surgery Day 01-12-22      METFORMIN: Take usual dose on Monday 01-11-22 but do NOT take on day of surgery, 01-12-22.  Take ONLY these medicines the morning of surgery with A SIP OF WATER: darolutamide (Nubequa), Wellbutrin   You may not have any metal on your body including jewelry, and body piercing  Do not wear lotions, powders,  cologne, or deodorant  Men may shave face and neck.  Contacts, Hearing Aids, dentures or bridgework may not be worn into surgery.   You may bring a small overnight bag with you on the day of surgery, only pack items that are not valuable .[facility] IS NOT RESPONSIBLE   FOR VALUABLES THAT ARE LOST OR STOLEN.    Do not bring your home medications to the hospital. The Pharmacy will dispense medications listed on your medication list to you during your admission in the Hospital.  Special Instructions: Bring a copy of your healthcare power of attorney and living will documents the day of surgery, if you wish to have them scanned into your [place] Medical Records- EPIC  Please read over the following fact sheets you were given: IF YOU HAVE QUESTIONS  ABOUT YOUR PRE-OP INSTRUCTIONS, PLEASE CALL [phone]  (KAY)   [facility] - Preparing for Surgery Before surgery, you can play an important role.  Because skin is not sterile, your skin needs to be as free of germs as possible.  You can reduce the number of germs on your skin by washing with CHG (chlorahexidine gluconate) soap before surgery.  CHG is an antiseptic cleaner which kills germs and bonds with the skin to continue killing germs even after washing. Please DO NOT use if you have an allergy to CHG or antibacterial soaps.  If your skin becomes reddened/irritated stop using the CHG and inform your  nurse when you arrive at Short Stay. Do not shave (including legs and underarms) for at least 48 hours prior to the first CHG shower.  You may shave your face/neck.  Please follow these instructions carefully:  1.  Shower with CHG Soap the night before surgery and the  morning of surgery.  2.  If you choose to wash your hair, wash your hair first as usual with your normal  shampoo.  3.  After you shampoo, rinse your hair and body thoroughly to remove the shampoo.                             4.  Use CHG as you would any other liquid soap.  You can apply chg directly to the skin and wash.  Gently with a scrungie or clean washcloth.  5.  Apply the CHG Soap to your body ONLY FROM THE NECK DOWN.   Do not use on face/ open                           Wound or open sores. Avoid contact with eyes, ears mouth and genitals (private parts).                       Wash face,  Genitals (private parts) with your normal soap.             6.  Wash thoroughly, paying special attention to the area where your  surgery  will be performed.  7.  Thoroughly rinse your body with warm water from the neck down.  8.  DO NOT shower/wash with your normal soap after using and rinsing off the CHG Soap.            9.  Pat yourself dry with a clean towel.            10.  Wear clean pajamas.            11.  Place clean sheets on your bed the night of your first shower and do not  sleep with pets.  ON THE DAY OF SURGERY : Do not apply any lotions/deodorants the morning of surgery.  Please wear clean clothes to the hospital/surgery center.    FAILURE TO FOLLOW THESE INSTRUCTIONS MAY RESULT IN THE CANCELLATION OF YOUR SURGERY  PATIENT SIGNATURE_________________________________  NURSE SIGNATURE__________________________________  ________________________________________________________________________

## 2021-12-21 ENCOUNTER — Encounter (HOSPITAL_COMMUNITY)
Admission: RE | Admit: 2021-12-21 | Discharge: 2021-12-21 | Disposition: A | Discharge status: Home / Self Care | Payer: No Typology Code available for payment source | Source: Ambulatory Visit | Attending: Urology | Admitting: Urology

## 2021-12-21 ENCOUNTER — Other Ambulatory Visit: Payer: Self-pay

## 2021-12-21 ENCOUNTER — Encounter (HOSPITAL_COMMUNITY): Payer: Self-pay

## 2021-12-21 VITALS — BP 144/74 | HR 71 | Temp 98.1°F | Resp 18 | Ht 68.0 in | Wt 185.4 lb

## 2021-12-21 DIAGNOSIS — Z01812 Encounter for preprocedural laboratory examination: Secondary | ICD-10-CM | POA: Diagnosis present

## 2021-12-21 DIAGNOSIS — I2510 Atherosclerotic heart disease of native coronary artery without angina pectoris: Secondary | ICD-10-CM | POA: Insufficient documentation

## 2021-12-21 DIAGNOSIS — E119 Type 2 diabetes mellitus without complications: Secondary | ICD-10-CM | POA: Insufficient documentation

## 2021-12-21 HISTORY — DX: Unspecified osteoarthritis, unspecified site: M19.90

## 2021-12-21 HISTORY — DX: Anxiety disorder, unspecified: F41.9

## 2021-12-21 HISTORY — DX: Personal history of urinary calculi: Z87.442

## 2021-12-21 LAB — CBC
HCT: 43.7 % (ref 39.0–52.0)
Hemoglobin: 14.7 g/dL (ref 13.0–17.0)
MCH: 30.8 pg (ref 26.0–34.0)
MCHC: 33.6 g/dL (ref 30.0–36.0)
MCV: 91.4 fL (ref 80.0–100.0)
Platelets: 178 10*3/uL (ref 150–400)
RBC: 4.78 MIL/uL (ref 4.22–5.81)
RDW: 12.8 % (ref 11.5–15.5)
WBC: 6.6 10*3/uL (ref 4.0–10.5)
nRBC: 0 % (ref 0.0–0.2)

## 2021-12-21 LAB — BASIC METABOLIC PANEL
Anion gap: 7 (ref 5–15)
BUN: 18 mg/dL (ref 8–23)
CO2: 25 mmol/L (ref 22–32)
Calcium: 9.1 mg/dL (ref 8.9–10.3)
Chloride: 106 mmol/L (ref 98–111)
Creatinine, Ser: 0.93 mg/dL (ref 0.61–1.24)
GFR, Estimated: >60 mL/min (ref >60)
Glucose, Bld: 141 mg/dL — ABNORMAL HIGH (ref 70–99)
Potassium: 4 mmol/L (ref 3.5–5.1)
Sodium: 138 mmol/L (ref 135–145)

## 2021-12-21 LAB — GLUCOSE, CAPILLARY: Glucose-Capillary: 142 mg/dL — ABNORMAL HIGH (ref 70–99)

## 2021-12-22 LAB — URINE CULTURE: Culture: NO GROWTH

## 2022-01-12 ENCOUNTER — Other Ambulatory Visit: Payer: Self-pay

## 2022-01-12 ENCOUNTER — Ambulatory Visit (HOSPITAL_COMMUNITY): Payer: No Typology Code available for payment source | Admitting: Anesthesiology

## 2022-01-12 ENCOUNTER — Encounter (HOSPITAL_COMMUNITY)
Admission: RE | Disposition: A | Discharge status: Home / Self Care | Payer: Self-pay | Source: Home / Self Care | Attending: Urology

## 2022-01-12 ENCOUNTER — Observation Stay (HOSPITAL_COMMUNITY)
Admission: RE | Admit: 2022-01-12 | Discharge: 2022-01-13 | Disposition: A | Discharge status: Home / Self Care | Payer: No Typology Code available for payment source | Attending: Urology | Admitting: Urology

## 2022-01-12 ENCOUNTER — Ambulatory Visit (HOSPITAL_BASED_OUTPATIENT_CLINIC_OR_DEPARTMENT_OTHER): Payer: No Typology Code available for payment source | Admitting: Anesthesiology

## 2022-01-12 ENCOUNTER — Encounter (HOSPITAL_COMMUNITY): Payer: Self-pay | Admitting: Urology

## 2022-01-12 DIAGNOSIS — Z7984 Long term (current) use of oral hypoglycemic drugs: Secondary | ICD-10-CM | POA: Insufficient documentation

## 2022-01-12 DIAGNOSIS — N5234 Erectile dysfunction following simple prostatectomy: Secondary | ICD-10-CM | POA: Diagnosis not present

## 2022-01-12 DIAGNOSIS — Z96612 Presence of left artificial shoulder joint: Secondary | ICD-10-CM | POA: Diagnosis not present

## 2022-01-12 DIAGNOSIS — Z79899 Other long term (current) drug therapy: Secondary | ICD-10-CM | POA: Diagnosis not present

## 2022-01-12 DIAGNOSIS — N529 Male erectile dysfunction, unspecified: Principal | ICD-10-CM | POA: Insufficient documentation

## 2022-01-12 DIAGNOSIS — Z8546 Personal history of malignant neoplasm of prostate: Secondary | ICD-10-CM | POA: Diagnosis not present

## 2022-01-12 DIAGNOSIS — Z87891 Personal history of nicotine dependence: Secondary | ICD-10-CM | POA: Insufficient documentation

## 2022-01-12 DIAGNOSIS — E119 Type 2 diabetes mellitus without complications: Secondary | ICD-10-CM | POA: Insufficient documentation

## 2022-01-12 DIAGNOSIS — Z960 Presence of urogenital implants: Secondary | ICD-10-CM

## 2022-01-12 HISTORY — PX: PENILE PROSTHESIS IMPLANT: SHX240

## 2022-01-12 LAB — HEMOGLOBIN A1C
Hgb A1c MFr Bld: 6.4 % — ABNORMAL HIGH (ref 4.8–5.6)
Mean Plasma Glucose: 137 mg/dL

## 2022-01-12 LAB — GLUCOSE, CAPILLARY
Glucose-Capillary: 134 mg/dL — ABNORMAL HIGH (ref 70–99)
Glucose-Capillary: 249 mg/dL — ABNORMAL HIGH (ref 70–99)

## 2022-01-12 SURGERY — INSERTION, PENILE PROSTHESIS, INFLATABLE
Anesthesia: General

## 2022-01-12 MED ORDER — DIPHENHYDRAMINE HCL 12.5 MG/5ML PO ELIX: 12.5000 mg | ORAL_SOLUTION | Freq: Four times a day (QID) | ORAL | Status: DC | PRN

## 2022-01-12 MED ORDER — PROPOFOL 10 MG/ML IV BOLUS
INTRAVENOUS | Status: AC
  Filled 2022-01-12: qty 20

## 2022-01-12 MED ORDER — ACETAMINOPHEN 10 MG/ML IV SOLN
1000.0000 mg | Freq: Once | INTRAVENOUS | Status: AC
  Administered 2022-01-12: 1000 mg via INTRAVENOUS

## 2022-01-12 MED ORDER — OXYCODONE HCL 5 MG PO TABS
5.0000 mg | ORAL_TABLET | ORAL | Status: DC | PRN
  Administered 2022-01-13 (×2): 5 mg via ORAL
  Filled 2022-01-12 (×2): qty 1

## 2022-01-12 MED ORDER — BUPIVACAINE HCL (PF) 0.5 % IJ SOLN
INTRAMUSCULAR | Status: AC
  Filled 2022-01-12: qty 30

## 2022-01-12 MED ORDER — LIDOCAINE HCL 1 % IJ SOLN
INTRAMUSCULAR | Status: DC | PRN
  Administered 2022-01-12: 30 mL

## 2022-01-12 MED ORDER — ACETAMINOPHEN 500 MG PO TABS
1000.0000 mg | ORAL_TABLET | Freq: Four times a day (QID) | ORAL | Status: DC
  Administered 2022-01-13 (×2): 1000 mg via ORAL
  Filled 2022-01-12 (×2): qty 2

## 2022-01-12 MED ORDER — LIDOCAINE HCL (PF) 2 % IJ SOLN
INTRAMUSCULAR | Status: AC
  Filled 2022-01-12: qty 5

## 2022-01-12 MED ORDER — FENTANYL CITRATE (PF) 100 MCG/2ML IJ SOLN
INTRAMUSCULAR | Status: AC
  Filled 2022-01-12: qty 2

## 2022-01-12 MED ORDER — LACTATED RINGERS IV SOLN: INTRAVENOUS | Status: DC

## 2022-01-12 MED ORDER — HYDROMORPHONE HCL 1 MG/ML IJ SOLN: 0.5000 mg | INTRAMUSCULAR | Status: DC | PRN

## 2022-01-12 MED ORDER — HYDROMORPHONE HCL 1 MG/ML IJ SOLN
0.2500 mg | INTRAMUSCULAR | Status: DC | PRN
  Administered 2022-01-12 (×3): 0.5 mg via INTRAVENOUS

## 2022-01-12 MED ORDER — DEXAMETHASONE SODIUM PHOSPHATE 10 MG/ML IJ SOLN
INTRAMUSCULAR | Status: DC | PRN
  Administered 2022-01-12: 4 mg via INTRAVENOUS

## 2022-01-12 MED ORDER — INSULIN ASPART 100 UNIT/ML IJ SOLN
0.0000 [IU] | Freq: Three times a day (TID) | INTRAMUSCULAR | Status: DC
  Administered 2022-01-13: 3 [IU] via SUBCUTANEOUS

## 2022-01-12 MED ORDER — BUPIVACAINE HCL (PF) 0.5 % IJ SOLN
INTRAMUSCULAR | Status: DC | PRN
  Administered 2022-01-12: 30 mL

## 2022-01-12 MED ORDER — ALBUTEROL SULFATE (2.5 MG/3ML) 0.083% IN NEBU: 2.5000 mg | INHALATION_SOLUTION | Freq: Four times a day (QID) | RESPIRATORY_TRACT | Status: DC | PRN

## 2022-01-12 MED ORDER — HYDROMORPHONE HCL 1 MG/ML IJ SOLN
INTRAMUSCULAR | Status: AC
  Administered 2022-01-12: 0.5 mg via INTRAVENOUS
  Filled 2022-01-12: qty 1

## 2022-01-12 MED ORDER — FLUCONAZOLE IN SODIUM CHLORIDE 200-0.9 MG/100ML-% IV SOLN
200.0000 mg | Freq: Once | INTRAVENOUS | Status: AC
  Administered 2022-01-12: 200 mg via INTRAVENOUS
  Filled 2022-01-12: qty 100

## 2022-01-12 MED ORDER — VANCOMYCIN HCL 1500 MG/300ML IV SOLN
1500.0000 mg | INTRAVENOUS | Status: AC
  Administered 2022-01-12: 1500 mg via INTRAVENOUS
  Filled 2022-01-12: qty 300

## 2022-01-12 MED ORDER — HYDROMORPHONE HCL 1 MG/ML IJ SOLN
0.5000 mg | INTRAMUSCULAR | Status: AC
  Administered 2022-01-12: 0.5 mg via INTRAVENOUS

## 2022-01-12 MED ORDER — SODIUM CHLORIDE 0.45 % IV SOLN: INTRAVENOUS | Status: DC

## 2022-01-12 MED ORDER — GENTAMICIN IN SALINE 1.2-0.9 MG/ML-% IV SOLN
120.0000 mg | INTRAVENOUS | Status: DC
  Filled 2022-01-12 (×2): qty 100

## 2022-01-12 MED ORDER — FENTANYL CITRATE (PF) 100 MCG/2ML IJ SOLN
INTRAMUSCULAR | Status: DC | PRN
  Administered 2022-01-12 (×4): 50 ug via INTRAVENOUS

## 2022-01-12 MED ORDER — ACETAMINOPHEN 10 MG/ML IV SOLN
INTRAVENOUS | Status: AC
  Filled 2022-01-12: qty 100

## 2022-01-12 MED ORDER — DIPHENHYDRAMINE HCL 50 MG/ML IJ SOLN: 12.5000 mg | Freq: Four times a day (QID) | INTRAMUSCULAR | Status: DC | PRN

## 2022-01-12 MED ORDER — ONDANSETRON HCL 4 MG/2ML IJ SOLN
INTRAMUSCULAR | Status: AC
  Filled 2022-01-12: qty 2

## 2022-01-12 MED ORDER — VANCOMYCIN HCL 500 MG/100ML IV SOLN
500.0000 mg | Freq: Three times a day (TID) | INTRAVENOUS | Status: AC
  Administered 2022-01-12 – 2022-01-13 (×2): 500 mg via INTRAVENOUS
  Filled 2022-01-12 (×2): qty 100

## 2022-01-12 MED ORDER — CHLORHEXIDINE GLUCONATE 0.12 % MT SOLN
15.0000 mL | Freq: Once | OROMUCOSAL | Status: AC
  Administered 2022-01-12: 15 mL via OROMUCOSAL

## 2022-01-12 MED ORDER — ROCURONIUM BROMIDE 10 MG/ML (PF) SYRINGE
PREFILLED_SYRINGE | INTRAVENOUS | Status: DC | PRN
  Administered 2022-01-12: 10 mg via INTRAVENOUS
  Administered 2022-01-12: 50 mg via INTRAVENOUS

## 2022-01-12 MED ORDER — HYDROMORPHONE HCL 1 MG/ML IJ SOLN
INTRAMUSCULAR | Status: AC
  Administered 2022-01-12: 0.5 mg via INTRAVENOUS
  Filled 2022-01-12: qty 2

## 2022-01-12 MED ORDER — DEXAMETHASONE SODIUM PHOSPHATE 10 MG/ML IJ SOLN
INTRAMUSCULAR | Status: AC
  Filled 2022-01-12: qty 1

## 2022-01-12 MED ORDER — ONDANSETRON HCL 4 MG/2ML IJ SOLN
4.0000 mg | INTRAMUSCULAR | Status: DC | PRN
  Administered 2022-01-12: 4 mg via INTRAVENOUS
  Filled 2022-01-12: qty 2

## 2022-01-12 MED ORDER — MIDAZOLAM HCL 2 MG/2ML IJ SOLN
INTRAMUSCULAR | Status: AC
  Filled 2022-01-12: qty 2

## 2022-01-12 MED ORDER — INSULIN ASPART 100 UNIT/ML IJ SOLN
0.0000 [IU] | Freq: Every day | INTRAMUSCULAR | Status: DC
  Administered 2022-01-12: 2 [IU] via SUBCUTANEOUS

## 2022-01-12 MED ORDER — BISACODYL 5 MG PO TBEC: 5.0000 mg | DELAYED_RELEASE_TABLET | Freq: Every day | ORAL | Status: DC | PRN

## 2022-01-12 MED ORDER — LIDOCAINE 2% (20 MG/ML) 5 ML SYRINGE
INTRAMUSCULAR | Status: DC | PRN
  Administered 2022-01-12: 60 mg via INTRAVENOUS

## 2022-01-12 MED ORDER — IRRISEPT - 450ML BOTTLE WITH 0.05% CHG IN STERILE WATER, USP 99.95% OPTIME
TOPICAL | Status: DC | PRN
  Administered 2022-01-12: 900 mL

## 2022-01-12 MED ORDER — MIDAZOLAM HCL 2 MG/2ML IJ SOLN
INTRAMUSCULAR | Status: DC | PRN
  Administered 2022-01-12: 2 mg via INTRAVENOUS

## 2022-01-12 MED ORDER — GENTAMICIN SULFATE 40 MG/ML IJ SOLN
120.0000 mg | Freq: Once | INTRAVENOUS | Status: AC
  Administered 2022-01-12: 120 mg via INTRAVENOUS
  Filled 2022-01-12: qty 3

## 2022-01-12 MED ORDER — OXYCODONE HCL 5 MG PO TABS
ORAL_TABLET | ORAL | Status: AC
  Administered 2022-01-12: 5 mg via ORAL
  Filled 2022-01-12: qty 1

## 2022-01-12 MED ORDER — ORAL CARE MOUTH RINSE: 15.0000 mL | Freq: Once | OROMUCOSAL | Status: AC

## 2022-01-12 MED ORDER — BUPROPION HCL ER (XL) 300 MG PO TB24
300.0000 mg | ORAL_TABLET | Freq: Every day | ORAL | Status: DC
  Administered 2022-01-13: 300 mg via ORAL
  Filled 2022-01-12: qty 1

## 2022-01-12 MED ORDER — ONDANSETRON HCL 4 MG/2ML IJ SOLN
INTRAMUSCULAR | Status: DC | PRN
  Administered 2022-01-12: 4 mg via INTRAVENOUS

## 2022-01-12 MED ORDER — SUGAMMADEX SODIUM 200 MG/2ML IV SOLN
INTRAVENOUS | Status: DC | PRN
  Administered 2022-01-12: 200 mg via INTRAVENOUS

## 2022-01-12 MED ORDER — PROPOFOL 10 MG/ML IV BOLUS
INTRAVENOUS | Status: DC | PRN
  Administered 2022-01-12: 130 mg via INTRAVENOUS

## 2022-01-12 MED ORDER — CELECOXIB 200 MG PO CAPS
200.0000 mg | ORAL_CAPSULE | Freq: Two times a day (BID) | ORAL | Status: DC
  Administered 2022-01-12 – 2022-01-13 (×2): 200 mg via ORAL
  Filled 2022-01-12 (×3): qty 1

## 2022-01-12 MED ORDER — TRAZODONE HCL 50 MG PO TABS: 50.0000 mg | ORAL_TABLET | Freq: Every evening | ORAL | Status: DC | PRN

## 2022-01-12 MED ORDER — LIDOCAINE HCL (PF) 1 % IJ SOLN
INTRAMUSCULAR | Status: AC
  Filled 2022-01-12: qty 30

## 2022-01-12 SURGICAL SUPPLY — 50 items
BAG COUNTER SPONGE SURGICOUNT (BAG) IMPLANT
BAG URINE DRAIN 2000ML AR STRL (UROLOGICAL SUPPLIES) ×1 IMPLANT
BENZOIN TINCTURE PRP APPL 2/3 (GAUZE/BANDAGES/DRESSINGS) ×1 IMPLANT
BLADE HEX COATED 2.75 (ELECTRODE) ×1 IMPLANT
BNDG COHESIVE 1X5 TAN STRL LF (GAUZE/BANDAGES/DRESSINGS) IMPLANT
BNDG GAUZE DERMACEA FLUFF 4 (GAUZE/BANDAGES/DRESSINGS) IMPLANT
CATH FOLEY 2WAY SLVR  5CC 16FR (CATHETERS) ×1
CATH FOLEY 2WAY SLVR 5CC 16FR (CATHETERS) ×1 IMPLANT
COVER MAYO STAND STRL (DRAPES) ×1 IMPLANT
COVER SURGICAL LIGHT HANDLE (MISCELLANEOUS) ×1 IMPLANT
DERMABOND ADVANCED .7 DNX12 (GAUZE/BANDAGES/DRESSINGS) IMPLANT
DISSECTOR ROUND CHERRY 3/8 STR (MISCELLANEOUS) ×1 IMPLANT
DRAIN CHANNEL 15F RND FF 3/16 (WOUND CARE) IMPLANT
DRAPE LAPAROTOMY T 102X78X121 (DRAPES) ×1 IMPLANT
DRSG TEGADERM 4X4.75 (GAUZE/BANDAGES/DRESSINGS) ×1 IMPLANT
EVACUATOR SILICONE 100CC (DRAIN) IMPLANT
Furlow, Disposable Insertion Tool IMPLANT
GAUZE SPONGE 4X4 12PLY STRL (GAUZE/BANDAGES/DRESSINGS) IMPLANT
GLOVE SURG LX STRL 7.5 STRW (GLOVE) ×1 IMPLANT
GOWN STRL REUS W/ TWL XL LVL3 (GOWN DISPOSABLE) ×1 IMPLANT
GOWN STRL REUS W/TWL XL LVL3 (GOWN DISPOSABLE) ×1
IMPL RTE SNAPCONE 0.5CM (Urological Implant) IMPLANT
IMPLANT RTE SNAPCONE 0.5CM (Urological Implant) ×1 IMPLANT
KIT ACCESSORY AMS 700 PUMP (Urological Implant) IMPLANT
KIT BASIN OR (CUSTOM PROCEDURE TRAY) ×1 IMPLANT
NS IRRIG 1000ML POUR BTL (IV SOLUTION) ×1 IMPLANT
PACK GENERAL/GYN (CUSTOM PROCEDURE TRAY) ×1 IMPLANT
PLUG CATH AND CAP STER (CATHETERS) ×1 IMPLANT
PUMP PRECONNECT MS 18 LGX (Miscellaneous) IMPLANT
PUMP PRECONNECT MS 18CM LGX (Miscellaneous) ×1 IMPLANT
RESERVOIR FLAT IZ 100ML (Miscellaneous) IMPLANT
RETRACTOR DEEP SCROTAL PENILE (MISCELLANEOUS) IMPLANT
RETRACTOR WILSON SYSTEM (INSTRUMENTS) IMPLANT
SOL PREP POV-IOD 4OZ 10% (MISCELLANEOUS) ×1 IMPLANT
SOL SCRUB PVP POV-IOD 4OZ 7.5% (MISCELLANEOUS) ×1
SOLUTION SCRB POV-IOD 4OZ 7.5% (MISCELLANEOUS) ×1 IMPLANT
SPONGE T-LAP 4X18 ~~LOC~~+RFID (SPONGE) IMPLANT
STRIP CLOSURE SKIN 1/2X4 (GAUZE/BANDAGES/DRESSINGS) ×1 IMPLANT
SUPPORT SCROTAL LG STRP (MISCELLANEOUS) ×1 IMPLANT
SUT MNCRL AB 4-0 PS2 18 (SUTURE) IMPLANT
SUT VIC AB 2-0 UR5 27 (SUTURE) ×6 IMPLANT
SUT VIC AB 2-0 UR6 27 (SUTURE) IMPLANT
SUT VIC AB 3-0 SH 27 (SUTURE) ×2
SUT VIC AB 3-0 SH 27X BRD (SUTURE) IMPLANT
SUT VIC AB 3-0 SH 27XBRD (SUTURE) IMPLANT
SYR 20ML LL LF (SYRINGE) ×1 IMPLANT
SYR 50ML LL SCALE MARK (SYRINGE) ×2 IMPLANT
TOOL FURLOW INSERT DISP (UROLOGICAL SUPPLIES) IMPLANT
TOWEL OR 17X26 10 PK STRL BLUE (TOWEL DISPOSABLE) ×2 IMPLANT
WATER STERILE IRR 1000ML POUR (IV SOLUTION) ×1 IMPLANT

## 2022-01-12 NOTE — Anesthesia Procedure Notes (Signed)
Procedure Name: Intubation Date/Time: 01/12/2022 2:12 PM  Performed by: Eben Burow, CRNAPre-anesthesia Checklist: Patient identified, Emergency Drugs available, Suction available, Patient being monitored and Timeout performed Patient Re-evaluated:Patient Re-evaluated prior to induction Oxygen Delivery Method: Circle system utilized Preoxygenation: Pre-oxygenation with 100% oxygen Induction Type: IV induction Ventilation: Mask ventilation without difficulty Laryngoscope Size: Mac and 4 Grade View: Grade II Tube type: Oral Tube size: 7.5 mm Number of attempts: 1 Airway Equipment and Method: Stylet Placement Confirmation: ETT inserted through vocal cords under direct vision, positive ETCO2 and breath sounds checked- equal and bilateral Secured at: 23 cm Tube secured with: Tape Dental Injury: Teeth and Oropharynx as per pre-operative assessment

## 2022-01-12 NOTE — Transfer of Care (Signed)
Immediate Anesthesia Transfer of Care Note  Patient: Larry Davis  Procedure(s) Performed: IMPLANTATION OF PENILE PROTHESIS INFLATABLE  Patient Location: PACU  Anesthesia Type:General  Level of Consciousness: awake  Airway & Oxygen Therapy: Patient Spontanous Breathing and Patient connected to face mask oxygen  Post-op Assessment: Report given to RN, Post -op Vital signs reviewed and stable, and Patient moving all extremities X 4  Post vital signs: Reviewed and stable  Last Vitals:  Vitals Value Taken Time  BP 141/68   Temp    Pulse 76   Resp 14   SpO2 92     Last Pain:  Vitals:   01/12/22 1212  TempSrc:   PainSc: 0-No pain      Patients Stated Pain Goal: 4 (90/12/22 4114)  Complications: No notable events documented.

## 2022-01-12 NOTE — Anesthesia Preprocedure Evaluation (Addendum)
Anesthesia Evaluation  Patient identified by MRN, date of birth, ID band Patient awake    Reviewed: Allergy & Precautions, NPO status , Patient's Chart, lab work & pertinent test results  History of Anesthesia Complications (+) PONV and history of anesthetic complications  Airway Mallampati: II  TM Distance: >3 FB Neck ROM: Full    Dental  (+)    Pulmonary neg shortness of breath, neg sleep apnea, neg COPD, neg recent URI, former smoker   Pulmonary exam normal breath sounds clear to auscultation       Cardiovascular negative cardio ROS  Rhythm:Regular Rate:Normal     Neuro/Psych  PSYCHIATRIC DISORDERS Anxiety        GI/Hepatic negative GI ROS, Neg liver ROS,,,  Endo/Other  diabetes, Type 2    Renal/GU negative Renal ROS     Musculoskeletal  (+) Arthritis ,    Abdominal  (+) - obese  Peds  Hematology negative hematology ROS (+)   Anesthesia Other Findings H/o prostate cancer s/p prostatectomy. Alpha-gal  Last dose of Ozempic: 01/03/2022  Reproductive/Obstetrics                             Anesthesia Physical Anesthesia Plan  ASA: 2  Anesthesia Plan: General   Post-op Pain Management:    Induction: Intravenous  PONV Risk Score and Plan: 3 and Ondansetron, Dexamethasone and Treatment may vary due to age or medical condition  Airway Management Planned: Oral ETT  Additional Equipment:   Intra-op Plan:   Post-operative Plan: Extubation in OR  Informed Consent: I have reviewed the patients History and Physical, chart, labs and discussed the procedure including the risks, benefits and alternatives for the proposed anesthesia with the patient or authorized representative who has indicated his/her understanding and acceptance.     Dental advisory given  Plan Discussed with: CRNA and Anesthesiologist  Anesthesia Plan Comments: (Risks of general anesthesia discussed including,  but not limited to, sore throat, hoarse voice, chipped/damaged teeth, injury to vocal cords, nausea and vomiting, allergic reactions, lung infection, heart attack, stroke, and death. All questions answered. )       Anesthesia Quick Evaluation

## 2022-01-12 NOTE — H&P (Signed)
H&P  History of Present Illness: Larry Davis is a 66 y.o. 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,3-galactose    Anxiety    Arthritis    Diabetes mellitus without complication ([place])    History of kidney stones    PONV (postoperative nausea and vomiting)    Prostate cancer ([place]) 03/2014   Urticaria     Past Surgical History:  Procedure Laterality Date   ADENOIDECTOMY     CHOLECYSTECTOMY  2000   COLONOSCOPY     LYMPHADENECTOMY Bilateral 06/14/2014   Procedure: PELVIC LYMPH NODE DISSECTION;  Surgeon: Alexis Frock, MD;  Location: [facility];  Service: Urology;  Laterality: Bilateral;   REVERSE SHOULDER ARTHROPLASTY Left 01/19/2018   REVERSE SHOULDER ARTHROPLASTY Left 01/19/2018   Procedure: REVERSE SHOULDER ARTHROPLASTY;  Surgeon: Justice Britain, MD;  Location: [facility];  Service: Orthopedics;  Laterality: Left;  161mn  Please move 7:30am [place] to 10am   ROBOT ASSISTED LAPAROSCOPIC RADICAL PROSTATECTOMY N/A 06/14/2014   Procedure: ROBOTIC ASSISTED LAPAROSCOPIC RADICAL PROSTATECTOMY WITH INDOCYANINE GREEN DYE INJECTION;  Surgeon: TAlexis Frock MD;  Location: [facility];  Service: Urology;  Laterality: N/A;   SHOULDER SURGERY Right 1990   arthroscopy   TONSILLECTOMY      Home Medications:  Current Meds  Medication Sig   acetaminophen (TYLENOL) 500 MG tablet Take 1,000 mg by mouth every 6 (six) hours as needed for mild pain.   buPROPion (WELLBUTRIN XL) 300 MG 24 hr tablet Take 300 mg by mouth daily with breakfast.   darolutamide (NUBEQA) 300 MG tablet Take 600 mg by mouth in the morning and at bedtime.   empagliflozin (JARDIANCE) 25 MG TABS tablet Take 12.5 mg by mouth in the morning.   fexofenadine (ALLEGRA ALLERGY) 180 MG tablet Take 1 tablet (180 mg total) by mouth daily for 15  days.   ibuprofen (ADVIL) 200 MG tablet Take 400 mg by mouth every 6 (six) hours as needed for mild pain.   metFORMIN (GLUCOPHAGE-XR) 500 MG 24 hr tablet Take 1 tablet (500 mg total) by mouth in the morning and at bedtime.   Multiple Vitamin (MULTIVITAMIN WITH MINERALS) TABS tablet Take 1 tablet by mouth every evening.   Semaglutide,0.25 or 0.'5MG'$ /DOS, 2 MG/1.5ML SOPN Inject 0.5 mg into the skin once a week. (Patient taking differently: Inject 0.5 mg into the skin every Sunday.)   traZODone (DESYREL) 150 MG tablet Take 75-150 mg by mouth at bedtime.    Allergies:  Allergies  Allergen Reactions   Bee Venom Anaphylaxis and Other (See Comments)    Throat swells   Beef-Derived Products Anaphylaxis    Family History  Problem Relation Age of Onset   Heart disease Mother    Diabetes Mother    Arthritis Mother    Hypertension Mother    Heart disease Father    Hypertension Father    Diabetes Father    Arthritis Father    Cancer Maternal Uncle        prostate   Cancer Maternal Grandmother        cervical    Social History:  reports that he quit smoking about 43 years ago. His smoking use included cigarettes. He has a 5.00 pack-year smoking history. He has never used smokeless tobacco. He reports that he does not drink alcohol and does not use  drugs.  ROS: A complete review of systems was performed.  All systems are negative except for pertinent findings as noted.  Physical Exam:  Vital signs in last 24 hours: Temp:  [97.8 F (36.6 C)] 97.8 F (36.6 C) (11/28 1200) Pulse Rate:  [69] 69 (11/28 1200) Resp:  [15] 15 (11/28 1200) BP: (134)/(76) 134/76 (11/28 1200) SpO2:  [98 %] 98 % (11/28 1200) Weight:  [81.2 kg] 81.2 kg (11/28 1212) Constitutional:  Alert and oriented, No acute distress Cardiovascular: Regular rate and rhythm Respiratory: Normal respiratory effort, Lungs clear bilaterally GI: Abdomen is soft, nontender, nondistended, no abdominal masses Lymphatic: No  lymphadenopathy Neurologic: Grossly intact, no focal deficits Psychiatric: Normal mood and affect   Laboratory Data:  No results for input(s): "WBC", "HGB", "HCT", "PLT" in the last 72 hours.  No results for input(s): "NA", "K", "CL", "GLUCOSE", "BUN", "CALCIUM", "CREATININE" in the last 72 hours.  Invalid input(s): "CO3"   Results for orders placed or performed during the hospital encounter of 01/12/22 (from the past 24 hour(s))  Glucose, capillary     Status: Abnormal   Collection Time: 01/12/22 12:04 PM  Result Value Ref Range   Glucose-Capillary 134 (H) 70 - 99 mg/dL   No results found for this or any previous visit (from the past 240 hour(s)).  Renal Function: No results for input(s): "CREATININE" in the last 168 hours. CrCl cannot be calculated (Patient's most recent lab result is older than the maximum 21 days allowed.).  Radiologic Imaging: No results found.  Assessment:  Larry Davis is a 66 y.o. year old with ED refractory to medical treatments, history of prostate cancer s/p prostatectomy/radiation  Plan:  To OR as planned for inflatable penile implant. Risks, including but not limited to bleeding, infection, hematoma, implant infection, damage to adjacent structures, urinary retention, device malfunction, reviewed with patient  Donald Pore, MD 01/12/2022, 12:17 PM  [facility] Pager: [phone]

## 2022-01-12 NOTE — Discharge Instructions (Signed)
Penile prosthesis postoperative instructions  Wound:  In most cases your incision will have absorbable sutures that will dissolve within the first 10-20 days. Some will fall out even earlier. Expect some redness as the sutures dissolved but this should occur only around the sutures. If there is generalized redness, especially with increasing pain or swelling, let us know. The scrotum and penis will very likely get "black and blue" as the blood in the tissues spread. Sometimes the whole scrotum will turn colors. The black and blue is followed by a yellow and brown color. In time, all the discoloration will go away. In some cases some firm swelling in the area of the testicle and pump may persist for up to 4-6 weeks after the surgery and is considered normal in most cases.  Drain:   You may be discharged home with a drain in place. If so, you will be taught how to empty it and should keep track of the output. Additionally, you should call the office to arrange for an appointment to have it removed after a few days.   Diet:  You may return to your normal diet within 24 hours following your surgery. You may note some mild nausea and possibly vomiting the first 6-8 hours following surgery. This is usually due to the side effects of anesthesia, and will disappear quite soon. I would suggest clear liquids and a very light meal the first evening following your surgery.  Activity:  Your physical activity should be restricted the first 48 hours. During that time you should remain relatively inactive, moving about only when necessary. During the first 3 weeks following surgery you should avoid lifting any heavy objects (anything greater than 15 pounds), and avoid strenuous exercise. If you work, ask us specifically about your restrictions, both for work and home. We will write a note to your employer if needed.  Avoid using your penis until your follow up visit with Larry Davis, which will typically be around  3-4 weeks following the surgery. Most people are able to start cycling their device after that appointment, and can have intercourse soon thereafter.   You should plan to wear a tight pair of jockey shorts or an athletic supporter for the first 4-5 days, even to sleep. This will keep the scrotum immobilized to some degree and keep the swelling down.The position of your penis will determine what is most comfortable but I strongly urge you to keep the penis in the "up" position (toward your head). You should continue to tuck "up" your penis when possible for the first 3 months following surgery.  Ice packs should be placed on and off over the scrotum for the first 48 hours. Frozen peas or corn in a ZipLock bag can be frozen, used and re-frozen. Fifteen minutes on and 15 minutes off is a reasonable schedule. The ice is a good pain reliever and keeps the swelling down.  Hygiene:  You may shower 48 hours after your surgery. Tub bathing should be restricted until the wound is completely healed, typically around 2-3 weeks.  Medication:  You will be sent home with some type of pain medication. In many cases you will be sent home with a strong anti-inflammatory medication (Celebrex, Meloxicam) and a narcotic pain pill (hydrocodone or oxycodone). You can also supplement these medications with tylenol (acetaminophen). If the pain medication you are sent home with does not control the pain, please notify the office Problems you should report to us:  Fever of 101.0 degrees   Fahrenheit or greater. Moderate or severe swelling under the skin incision or involving the scrotum. Drug reaction such as hives, a rash, nausea or vomiting.  

## 2022-01-12 NOTE — Op Note (Signed)
PATIENT:  Larry Davis  PRE-OPERATIVE DIAGNOSIS:  Organic erectile dysfunction  POST-OPERATIVE DIAGNOSIS:  Same  PROCEDURE:  Procedure(s): 3 piece inflatable penile prosthesis (BS/AMS)  SURGEON:  Donald Pore MD  ASST: Margarita Sermons, MD  INDICATION: He has had long-standing organic erectile dysfunction and refractory to other modes of treatment. He has elected to proceed with prosthesis implantation.  ANESTHESIA:  General  EBL:  Minimal  Device: 3 piece AMS LGX 700: 100 cc conceal reservoir, 18 cm cylinders and 1/2 cm rear-tip extenders on right and left sides  LOCAL MEDICATIONS USED:  None  SPECIMEN: None  DISPOSITION OF SPECIMEN:  N/A  Description of procedure: The patient was taken to the major operating room, placed on the table and administered general anesthesia in the supine position. His genitalia was then prepped with chlorhexidine x 2. He was draped in the usual sterile fashion, and I used [place] on the field. An official timeout was then performed.  A 14 French coude catheter was then placed in the bladder and the bladder was drained and the catheter was plugged. A midline penoscrotal incision was then made and the dissection was carried down to the corpora and urethra. The lonestar retractor was positioned so as to have excellent exposure. 2-0 Vicryl sutures were then placed proximally in each corpus cavernosum to serve as stay sutures. An incision was then made in the corpus cavernosum first on the left-hand side with the bovie. [place] were used to gently dilate the opening. I then dilated the corpus cavernosum with the a 12 Fr brooks dilator distally and proximally. Field goal post tests were performed and there was no evidence of perforations or crossover. I then irrigated the corpus cavernosum with antibiotic solution and measured the distance proximally and distally from the stay suture and was found to be 8 and 10 cm, respectively.I then turned my attention to the  contralateral corpus cavernosum and placed my stay sutures, made my corporotomy and dilated the corpus cavernosum in an identical fashion. This was measured and also was found to be 8.5 cm proximally and 10 distally. It was irrigated with anastomotic solution as was the scrotum. I then chose an 18 cm cylinder set with 0.5 cm rear-tip extenders and these were prepped while I prepared the site for reservoir placement.  I then digitally probed into the R external inguinal ring. A kelly was used to poke through the posterior wall of the ring. I used my finger to ensure I was in the appropriate space, and to clear room for the reservoir. I irrigated the space with anastomotic solution and then placed the reservoir in this location. I then filled the reservoir with 65 cc of sterile saline, and checked to confirm proper position. There was minimal backpressure with the reservoir max-filled.  Attention was redirected to the corporotomies where the cylinders were then placed by first fixing the suture to the distal aspect of the right cylinder to a straight needle. This was then loaded on the [facility] inserter and passed through the corporotomy and distally. I then advanced the straight needle with the [place] inserter out through the glans and this was grasped with a hemostat and pulled through the glans and the suture was secured with a hemostat. I then performed an identical maneuver on the contralateral side. After this was performed I irrigated both corpus cavernosum; there was no evidence of urethral perforation. I inserted the distal portion of the cylinder through the corporotomies and pulled this to the end of  the corpora with the suture. The proximal aspect with the rear-tip extender was then passed through the corporotomy and into the seated position on each side. I then connected reservoir tubing to a syringe filled with sterile saline and inflated the device. I noted a good straight erection with both cylinders  equidistant under the glans and no buckling of the cylinders. I therefore deflated the device and closed the corporotomies with used my previously placed stay sutures.   I then grasped the scrotal skin in the midline with a babcock, and used a hemostat to dissect down to the dependent-most portion of the scrotum. The nasal speculum was inserted into this space, and facilitated placement of the pump. The cylinder was then connected to the pump after excising the excess tubing with appropriate shodded hemostats in place and then I used the supplied connectors to make the connection. I then again cycled the device with the pump and it cycled properly. I deflated the device and pumped it up about three quarters of the way to aid with hemostasis. I irrigated the wound one last time with antibiotic irrigation and then closed the deep scrotal tissue over the tubing and pump with running 3-0 monocryl suture. I placed a 15 Fr blake drain over the corporotomies. A second layer was then closed over this first layer with running 3- 0 monocryl, and running skin suture w/ 4-0 monocryl performed. Incision dressed with dermabond.  A mummy wrap was applied. The catheter was connected to closed system drainage, and drain connected to suction bulb and the patient was awakened and taken recovery room in stable and satisfactory condition. He tolerated the procedure well and there were no intraoperative complications. Needle sponge and instrument counts were correct at the end of the operation.

## 2022-01-13 ENCOUNTER — Other Ambulatory Visit: Payer: Self-pay

## 2022-01-13 ENCOUNTER — Other Ambulatory Visit (HOSPITAL_BASED_OUTPATIENT_CLINIC_OR_DEPARTMENT_OTHER): Payer: Self-pay

## 2022-01-13 DIAGNOSIS — N529 Male erectile dysfunction, unspecified: Secondary | ICD-10-CM | POA: Diagnosis not present

## 2022-01-13 LAB — GLUCOSE, CAPILLARY: Glucose-Capillary: 173 mg/dL — ABNORMAL HIGH (ref 70–99)

## 2022-01-13 MED ORDER — OXYCODONE HCL 5 MG PO TABS
5.0000 mg | ORAL_TABLET | Freq: Three times a day (TID) | ORAL | 0 refills | Status: AC | PRN
  Filled 2022-01-13: qty 20, 7d supply, fill #0

## 2022-01-13 MED ORDER — CELECOXIB 200 MG PO CAPS
200.0000 mg | ORAL_CAPSULE | Freq: Two times a day (BID) | ORAL | 1 refills | Status: AC
  Filled 2022-01-13: qty 28, 14d supply, fill #0

## 2022-01-13 MED ORDER — ACETAMINOPHEN 500 MG PO TABS
1000.0000 mg | ORAL_TABLET | Freq: Four times a day (QID) | ORAL | 1 refills | Status: DC
  Filled 2022-01-13: qty 100, 13d supply, fill #0

## 2022-01-13 MED ORDER — SULFAMETHOXAZOLE-TRIMETHOPRIM 800-160 MG PO TABS
1.0000 | ORAL_TABLET | Freq: Two times a day (BID) | ORAL | 0 refills | Status: DC
  Filled 2022-01-13: qty 14, 7d supply, fill #0

## 2022-01-13 NOTE — Discharge Summary (Incomplete)
Date of admission: 01/12/2022  Date of discharge: 01/13/2022  Admission diagnosis: Organic erectile dysfunction  Discharge diagnosis: same  Secondary diagnoses: none  History and Physical: For full details, please see admission history and physical. Briefly, Larry Davis is a 66 y.o. year old patient with long-standing organic erectile dysfunction refractory to other modes of treatment. He presented for penile prosthesis implantation.   Hospital Course: Patient underwent 3 piece IPP placement with Dr. Cain Sieve on 01/12/22. Details of the procedure can be seen in a separately dictated Operative Report. The patient tolerated the procedure well and was transferred to the PACU without issue. He did well post-operatively and into POD#1. His pain was well-controlled on PO pain medications and he was tolerating PO without nausea or vomiting. He was ambulating. His catheter was removed in the AM of POD#1 and he passed a TOV. A JP drain was left post-operatively and had 130cc output through POD#1, so this was removed prior to discharge.   By the afternoon of POD#1, patient had met all goals for discharge. He was given post-operative instructions and discharged home on PRN pain medications and 1 week of Bactrim antibiotic prophylaxis.  Laboratory values: No results for input(s): "HGB", "HCT" in the last 72 hours. No results for input(s): "CREATININE" in the last 72 hours.  Disposition: Home  Discharge instruction: The patient was instructed to be ambulatory but told to refrain from heavy lifting, strenuous activity, or driving.   Discharge medications:  Allergies as of 01/13/2022       Reactions   Bee Venom Anaphylaxis, Other (See Comments)   Throat swells   Beef-derived Products Anaphylaxis        Medication List     TAKE these medications    acetaminophen 500 MG tablet Commonly known as: TYLENOL Take 2 tablets (1,000 mg total) by mouth every 6 (six) hours. What changed:  when to  take this reasons to take this   albuterol 108 (90 Base) MCG/ACT inhaler Commonly known as: VENTOLIN HFA Inhale 2 puffs into the lungs every 6 (six) hours as needed for wheezing or shortness of breath.   blood glucose meter kit and supplies Dispense based on patient and insurance preference. Use 2 times daily as directed.   buPROPion 300 MG 24 hr tablet Commonly known as: WELLBUTRIN XL Take 300 mg by mouth daily with breakfast.   EPINEPHrine 0.3 mg/0.3 mL Soaj injection Commonly known as: EPI-PEN Inject 0.3 mg into the muscle once as needed for up to 1 dose for anaphylaxis.   fexofenadine 180 MG tablet Commonly known as: Allegra Allergy Take 1 tablet (180 mg total) by mouth daily for 15 days.   FREESTYLE LITE test strip Generic drug: glucose blood USE AS DIRECTED TO CHECK BLOOD SUGAR UP TO 3 TIMES DAILY   ibuprofen 200 MG tablet Commonly known as: ADVIL Take 400 mg by mouth every 6 (six) hours as needed for mild pain.   Jardiance 25 MG Tabs tablet Generic drug: empagliflozin Take 12.5 mg by mouth in the morning.   metFORMIN 500 MG 24 hr tablet Commonly known as: GLUCOPHAGE-XR Take 1 tablet (500 mg total) by mouth in the morning and at bedtime.   multivitamin with minerals Tabs tablet Take 1 tablet by mouth every evening.   Nubeqa 300 MG tablet Generic drug: darolutamide Take 600 mg by mouth in the morning and at bedtime.   nystatin 100000 UNIT/ML suspension Commonly known as: MYCOSTATIN Take 1 tablespoon and rinse for 1 minute then spit, 3  times a day   Ozempic (0.25 or 0.5 MG/DOSE) 2 MG/1.5ML Sopn Generic drug: Semaglutide(0.25 or 0.5MG/DOS) Inject 0.5 mg into the skin once a week. What changed: when to take this   predniSONE 10 MG (21) Tbpk tablet Commonly known as: STERAPRED UNI-PAK 21 TAB Take by mouth daily. Take 6 tabs by mouth daily  for 2 days, then 5 tabs for 2 days, then 4 tabs for 2 days, then 3 tabs for 2 days, 2 tabs for 2 days, then 1 tab by mouth  daily for 2 days   sulfamethoxazole-trimethoprim 800-160 MG tablet Commonly known as: BACTRIM DS Take 1 tablet by mouth 2 (two) times daily.   traZODone 150 MG tablet Commonly known as: DESYREL Take 75-150 mg by mouth at bedtime.        Followup:

## 2022-01-13 NOTE — TOC Initial Note (Signed)
Transition of Care [facility]) - Initial/Assessment Note    Patient Details  Name: Larry Davis MRN: [id number] Date of Birth: 09-28-1955  Transition of Care [facility]) CM/SW Contact:    Henrietta Dine, RN Phone Number: 01/13/2022, 9:40 AM  Clinical Narrative:                  Transition of Care [facility]) Screening Note   Patient Details  Name: Larry Davis Date of Birth: 05-10-1955   Transition of Care [facility]) CM/SW Contact:    Henrietta Dine, RN Phone Number: 01/13/2022, 9:40 AM   Transition of Care Department [facility]) has reviewed patient and no TOC needs have been identified at this time. We will continue to monitor patient advancement through interdisciplinary progression rounds. If new patient transition needs arise, please place a TOC consult.          Patient Goals and CMS Choice        Expected Discharge Plan and Services           Expected Discharge Date: 01/13/22                                    Prior Living Arrangements/Services                       Activities of Daily Living Home Assistive Devices/Equipment: Blood pressure cuff, CBG Meter, Eyeglasses, Hearing aid ADL Screening (condition at time of admission) Patient's cognitive ability adequate to safely complete daily activities?: Yes Is the patient deaf or have difficulty hearing?: Yes Does the patient have difficulty seeing, even when wearing glasses/contacts?: No Does the patient have difficulty concentrating, remembering, or making decisions?: No Patient able to express need for assistance with ADLs?: Yes Does the patient have difficulty dressing or bathing?: No Independently performs ADLs?: Yes (appropriate for developmental age) Does the patient have difficulty walking or climbing stairs?: No Weakness of Legs: None Weakness of Arms/Hands: None  Permission Sought/Granted                  Emotional Assessment              Admission diagnosis:   S/P insertion of penile implant [Z96.0] Patient Active Problem List   Diagnosis Date Noted   S/P insertion of penile implant 01/12/2022   Pre-op evaluation 11/17/2021   Cough 11/17/2021   Preventative health care 06/20/2020   S/P reverse total shoulder arthroplasty, 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 [place] [address] Phone: [phone] Fax: [phone]  [id number] [place] [address]: [phone]: [phone]    Social Determinants of Health (SDOH) Interventions    Readmission Risk Interventions     No data to display

## 2022-01-13 NOTE — Anesthesia Postprocedure Evaluation (Signed)
Anesthesia Post Note  Patient: Larry Davis  Procedure(s) Performed: IMPLANTATION OF PENILE PROTHESIS INFLATABLE     Patient location during evaluation: PACU Anesthesia Type: General Level of consciousness: awake and alert Pain management: pain level controlled Vital Signs Assessment: post-procedure vital signs reviewed and stable Respiratory status: spontaneous breathing, nonlabored ventilation, respiratory function stable and patient connected to nasal cannula oxygen Cardiovascular status: blood pressure returned to baseline and stable Postop Assessment: no apparent nausea or vomiting Anesthetic complications: no  No notable events documented.  Last Vitals:  Vitals:   01/13/22 0407 01/13/22 0744  BP: 110/66 126/65  Pulse: (!) 55 60  Resp: 14 15  Temp: 36.7 C 36.7 C  SpO2: 96% 97%    Last Pain:  Vitals:   01/13/22 0800  TempSrc:   PainSc: 0-No pain                 Barnet Glasgow

## 2022-01-13 NOTE — Discharge Summary (Addendum)
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 02/25/2016   Overweight (BMI 25.0-29.9) 09/23/2016   Abnormal electrocardiogram (ECG) (EKG) 03/14/2017   Rotator cuff tear arthropathy 05/30/2017   S/P reverse total shoulder arthroplasty, 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 Briefly, Larry Davis is a 66 y.o. year old patient who was admitted s/p insertion of inflatable penile prosthesis.   Hospital Course: foley and drain removed POD 1. He was able to void spontaneously after foley removal  EXAM: NAD, AOx3 Penoscrotal incision c/d/I with dermabond in place Abdomen soft, ND, NT Respirations non labored  On the day of discharge, the patient was tolerating a regular diet and their pain was well controlled. They were determined to be stable for discharge home  Laboratory values: No results for input(s): "HGB", "HCT" in the last 72  hours. No results for input(s): "CREATININE" in the last 72 hours.  Disposition: Home  Discharge medications:  Allergies as of 01/13/2022       Reactions   Bee Venom Anaphylaxis, Other (See Comments)   Throat swells   Beef-derived Products Anaphylaxis        Medication List     TAKE these medications    acetaminophen 500 MG tablet Commonly known as: TYLENOL Take 2 tablets (1,000 mg total) by mouth every 6 (six) hours. What changed:  when to take this reasons to take this   albuterol 108 (90 Base) MCG/ACT inhaler Commonly known as: VENTOLIN HFA Inhale 2 puffs into the lungs every 6 (six) hours as needed for wheezing or shortness of breath.   blood glucose meter kit and supplies Dispense based on patient and insurance preference. Use 2 times daily as directed.   buPROPion 300 MG 24 hr tablet Commonly known as: WELLBUTRIN XL Take 300 mg by mouth daily with breakfast.   celecoxib 200 MG capsule Commonly known as: CeleBREX Take 1 capsule (200 mg total) by mouth 2 (two) times daily for 28 days.   EPINEPHrine 0.3 mg/0.3 mL Soaj injection Commonly known as: EPI-PEN Inject 0.3 mg into the muscle once as needed for up to 1 dose for anaphylaxis.   fexofenadine 180 MG tablet Commonly known as: Allegra Allergy Take 1 tablet (180 mg total) by mouth daily for 15 days.   FREESTYLE LITE test strip Generic drug: glucose blood USE AS DIRECTED TO CHECK BLOOD SUGAR UP TO 3 TIMES DAILY  ibuprofen 200 MG tablet Commonly known as: ADVIL Take 400 mg by mouth every 6 (six) hours as needed for mild pain.   Jardiance 25 MG Tabs tablet Generic drug: empagliflozin Take 12.5 mg by mouth in the morning.   metFORMIN 500 MG 24 hr tablet Commonly known as: GLUCOPHAGE-XR Take 1 tablet (500 mg total) by mouth in the morning and at bedtime.   multivitamin with minerals Tabs tablet Take 1 tablet by mouth every evening.   Nubeqa 300 MG tablet Generic drug: darolutamide Take 600 mg by  mouth in the morning and at bedtime.   nystatin 100000 UNIT/ML suspension Commonly known as: MYCOSTATIN Take 1 tablespoon and rinse for 1 minute then spit, 3 times a day   oxyCODONE 5 MG immediate release tablet Commonly known as: Roxicodone Take 1 tablet (5 mg total) by mouth every 8 (eight) hours as needed.   Ozempic (0.25 or 0.5 MG/DOSE) 2 MG/1.5ML Sopn Generic drug: Semaglutide(0.25 or 0.5MG/DOS) Inject 0.5 mg into the skin once a week. What changed: when to take this   predniSONE 10 MG (21) Tbpk tablet Commonly known as: STERAPRED UNI-PAK 21 TAB Take by mouth daily. Take 6 tabs by mouth daily  for 2 days, then 5 tabs for 2 days, then 4 tabs for 2 days, then 3 tabs for 2 days, 2 tabs for 2 days, then 1 tab by mouth daily for 2 days   sulfamethoxazole-trimethoprim 800-160 MG tablet Commonly known as: BACTRIM DS Take 1 tablet by mouth 2 (two) times daily.   traZODone 150 MG tablet Commonly known as: DESYREL Take 75-150 mg by mouth at bedtime.               Discharge Care Instructions  (From admission, onward)           Start     Ordered   01/13/22 0000  If the dressing is still on your incision site when you go home, remove it on the third day after your surgery date. Remove dressing if it begins to fall off, or if it is dirty or damaged before the third day.        01/13/22 0830            Followup:

## 2022-01-25 ENCOUNTER — Encounter (HOSPITAL_COMMUNITY): Payer: Self-pay | Admitting: Urology

## 2022-03-05 ENCOUNTER — Other Ambulatory Visit (HOSPITAL_BASED_OUTPATIENT_CLINIC_OR_DEPARTMENT_OTHER): Payer: Self-pay

## 2022-03-05 MED ORDER — METHYLPREDNISOLONE 4 MG PO TBPK
ORAL_TABLET | ORAL | 0 refills | Status: DC
  Filled 2022-03-05: qty 21, 6d supply, fill #0

## 2022-05-28 ENCOUNTER — Other Ambulatory Visit (HOSPITAL_COMMUNITY): Payer: Self-pay | Admitting: Urology

## 2022-05-28 DIAGNOSIS — C61 Malignant neoplasm of prostate: Secondary | ICD-10-CM

## 2022-08-25 ENCOUNTER — Encounter (HOSPITAL_COMMUNITY): Payer: No Typology Code available for payment source

## 2022-08-26 ENCOUNTER — Other Ambulatory Visit (HOSPITAL_COMMUNITY): Payer: No Typology Code available for payment source

## 2022-09-02 ENCOUNTER — Encounter (HOSPITAL_COMMUNITY)
Admission: RE | Admit: 2022-09-02 | Discharge: 2022-09-02 | Disposition: A | Discharge status: Home / Self Care | Payer: No Typology Code available for payment source | Source: Ambulatory Visit | Attending: Urology | Admitting: Urology

## 2022-09-02 DIAGNOSIS — C61 Malignant neoplasm of prostate: Secondary | ICD-10-CM | POA: Diagnosis present

## 2022-09-02 MED ORDER — TECHNETIUM TC 99M MEDRONATE IV KIT
20.0000 | PACK | Freq: Once | INTRAVENOUS | Status: AC | PRN
  Administered 2022-09-02: 20.2 via INTRAVENOUS

## 2022-09-06 ENCOUNTER — Other Ambulatory Visit (HOSPITAL_COMMUNITY): Payer: No Typology Code available for payment source

## 2023-03-10 ENCOUNTER — Other Ambulatory Visit (HOSPITAL_BASED_OUTPATIENT_CLINIC_OR_DEPARTMENT_OTHER): Payer: Self-pay

## 2023-09-15 ENCOUNTER — Encounter: Payer: Self-pay | Admitting: Neurology

## 2023-09-21 NOTE — Progress Notes (Unsigned)
 Initial neurology clinic note  Reason for Evaluation: Consultation requested by Bonita Derinda RAMAN, MD for an opinion regarding neuropathy. My final recommendations will be communicated back to the requesting physician by way of shared medical record or letter to requesting physician via [place]  mail.  HPI: This is Mr. Larry Davis, a 68 y.o. right-handed male with a medical history of DM, HLD, prostate cancer, OSA on CPAP, chronic low back pain, bilateral sensorineural hearing loss, PTSD, insomnia who presents to neurology clinic with the chief complaint of numbness and burning in feet. The patient is alone today.  Patient first noticed symptoms about 10 years ago. He noticed a burning sensation in his feet, more present when laying down. It has progressed over the years to include numbness and pain that can prevent sleep. He did burn his foot once without knowing it. He denies significant imbalance or falls.  He denies neck or back pain. He denies symptoms in his upper extremities.  He is currently on gabapentin 400 mg at bedtime. He feels this makes him sleepy.  He takes B12 supplementation and a B complex. He has tried capsaicin cream which he did not like. He has tried biofreeze and lidocaine  cream. This help a little for a short period of time.   He endorses restless legs at night. It goes away when he gets up and walks around.  He endorses a lot of fatigue, hot flashes due to medication for prostate cancer. He denies unintentional weight loss.  The patient does not report symptoms referable to autonomic dysfunction including impaired sweating, heat or cold intolerance, excessive mucosal dryness, gastroparetic early satiety, postprandial abdominal bloating, constipation, and bowel or bladder dyscontrol.   EtOH use: None currently; sober for 44 years  Restrictive diet? Cannot eat read meat, but eats chicken and fish Family history of neurologic disease? Dementia in  family   MEDICATIONS:  Outpatient Encounter Medications as of 09/22/2023  Medication Sig Note   albuterol  (VENTOLIN  HFA) 108 (90 Base) MCG/ACT inhaler Inhale 2 puffs into the lungs every 6 (six) hours as needed for wheezing or shortness of breath.    blood glucose meter kit and supplies Dispense based on patient and insurance preference. Use 2 times daily as directed.    buPROPion  (WELLBUTRIN  XL) 300 MG 24 hr tablet Take 300 mg by mouth daily with breakfast.    darolutamide (NUBEQA) 300 MG tablet Take 600 mg by mouth in the morning and at bedtime.    EPINEPHrine  0.3 mg/0.3 mL IJ SOAJ injection Inject 0.3 mg into the muscle once as needed for up to 1 dose for anaphylaxis. 01/12/2022: Nov 2022 last beef related reaction    fexofenadine  (ALLEGRA  ALLERGY) 180 MG tablet Take 1 tablet (180 mg total) by mouth daily for 15 days.    glucose blood (FREESTYLE LITE) test strip USE AS DIRECTED TO CHECK BLOOD SUGAR UP TO 3 TIMES DAILY    Semaglutide ,0.25 or 0.5MG /DOS, 2 MG/1.5ML SOPN Inject 0.5 mg into the skin once a week. (Patient taking differently: Inject 0.5 mg into the skin every Sunday.)    sulfamethoxazole -trimethoprim  (BACTRIM  DS) 800-160 MG tablet Take 1 tablet by mouth 2 (two) times daily.    traZODone  (DESYREL ) 150 MG tablet Take 75-150 mg by mouth at bedtime. 01/12/2022: Took 1/2 dose   [DISCONTINUED] acetaminophen  (TYLENOL ) 500 MG tablet Take 2 tablets (1,000 mg total) by mouth every 6 (six) hours.    [DISCONTINUED] empagliflozin  (JARDIANCE ) 25 MG TABS tablet Take 12.5 mg by mouth in  the morning.    [DISCONTINUED] ibuprofen (ADVIL) 200 MG tablet Take 400 mg by mouth every 6 (six) hours as needed for mild pain.    [DISCONTINUED] metFORMIN  (GLUCOPHAGE -XR) 500 MG 24 hr tablet Take 1 tablet (500 mg total) by mouth in the morning and at bedtime.    [DISCONTINUED] methylPREDNISolone  (MEDROL ) 4 MG TBPK tablet Take as directed on package for a 6 day supply    [DISCONTINUED] Multiple Vitamin (MULTIVITAMIN  WITH MINERALS) TABS tablet Take 1 tablet by mouth every evening.    [DISCONTINUED] nystatin  (MYCOSTATIN ) 100000 UNIT/ML suspension Take 1 tablespoon and rinse for 1 minute then spit, 3 times a day (Patient not taking: Reported on 12/21/2021)    [DISCONTINUED] predniSONE  (STERAPRED UNI-PAK 21 TAB) 10 MG (21) TBPK tablet Take by mouth daily. Take 6 tabs by mouth daily  for 2 days, then 5 tabs for 2 days, then 4 tabs for 2 days, then 3 tabs for 2 days, 2 tabs for 2 days, then 1 tab by mouth daily for 2 days (Patient not taking: Reported on 12/21/2021)    No facility-administered encounter medications on file as of 09/22/2023.    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,3-galactose    Anxiety    Arthritis    Diabetes mellitus without complication (HCC)    History of kidney stones    PONV (postoperative nausea and vomiting)    Prostate cancer (HCC) 03/2014   Urticaria     PAST SURGICAL HISTORY: Past Surgical History:  Procedure Laterality Date   ADENOIDECTOMY     CHOLECYSTECTOMY  2000   COLONOSCOPY     LYMPHADENECTOMY Bilateral 06/14/2014   Procedure: PELVIC LYMPH NODE DISSECTION;  Surgeon: Ricardo Likens, MD;  Location: [facility];  Service: Urology;  Laterality: Bilateral;   PENILE PROSTHESIS IMPLANT N/A 01/12/2022   Procedure: IMPLANTATION OF PENILE PROTHESIS INFLATABLE;  Surgeon: Lovie Arlyss CROME, MD;  Location: [facility];  Service: Urology;  Laterality: N/A;  90 MINUTES NEEDED FOR CASE   REVERSE SHOULDER ARTHROPLASTY Left 01/19/2018   REVERSE SHOULDER ARTHROPLASTY Left 01/19/2018   Procedure: REVERSE SHOULDER ARTHROPLASTY;  Surgeon: Melita Drivers, MD;  Location: [facility];  Service: Orthopedics;  Laterality: Left;   Please move 7:30am Ritchie to 10am   ROBOT ASSISTED LAPAROSCOPIC RADICAL PROSTATECTOMY N/A 06/14/2014   Procedure: ROBOTIC ASSISTED LAPAROSCOPIC RADICAL PROSTATECTOMY WITH INDOCYANINE GREEN DYE INJECTION;  Surgeon:  Ricardo Likens, MD;  Location: [facility];  Service: Urology;  Laterality: N/A;   SHOULDER SURGERY Right 1990   arthroscopy   TONSILLECTOMY      ALLERGIES: Allergies  Allergen Reactions   Bee Venom Anaphylaxis and Other (See Comments)    Throat swells   Beef-Derived Drug Products Anaphylaxis    FAMILY HISTORY: Family History  Problem Relation Age of Onset   Heart disease Mother    Diabetes Mother    Arthritis Mother    Hypertension Mother    Heart disease Father    Hypertension Father    Diabetes Father    Arthritis Father    Cancer Maternal Uncle        prostate   Cancer Maternal Grandmother        cervical    SOCIAL HISTORY: Social History   Tobacco Use   Smoking status: Former    Current packs/day: 0.00    Average packs/day: 0.5 packs/day for 10.0 years (5.0 ttl pk-yrs)    Types: Cigarettes    Start  date: 02/16/1968    Quit date: 02/15/1978    Years since quitting: 45.6   Smokeless tobacco: Never  Vaping Use   Vaping status: Never Used  Substance Use Topics   Alcohol use: No    Alcohol/week: 0.0 standard drinks of alcohol   Drug use: No   Social History   Social History Narrative   2 children Son and daughter 3 biological grandchildren- live in [place]    Wife has 4 children   Married   Web designer- retired since 11/18   Has masters degree   Enjoys Motorcycling    Right handed     OBJECTIVE: PHYSICAL EXAM: BP 114/69 (BP Location: Right Arm, Patient Position: Sitting, Cuff Size: Normal)   Pulse 62   Ht 5' 8 (1.727 m)   Wt 193 lb (87.5 kg)   SpO2 97%   BMI 29.35 kg/m   General: General appearance: Awake and alert. No distress. Cooperative with exam.  Skin: No obvious rash or jaundice. HEENT: Atraumatic. Anicteric. Lungs: Non-labored breathing on room air  Heart: Regular Extremities: No edema. No obvious deformity.  Musculoskeletal: No obvious joint swelling. High arches Psych: Affect appropriate.  Neurological: Mental Status: Alert.  Speech fluent. No pseudobulbar affect Cranial Nerves: CNII: No RAPD. Visual fields grossly intact. CNIII, IV, VI: PERRL. No nystagmus. EOMI. CN V: Facial sensation intact bilaterally to fine touch. CN VII: Facial muscles symmetric and strong. No ptosis at rest CN VIII: Hearing grossly intact bilaterally. CN IX: No hypophonia. CN X: Palate elevates symmetrically. CN XI: Full strength shoulder shrug bilaterally. CN XII: Tongue protrusion full and midline. No atrophy or fasciculations. No significant dysarthria Motor: Tone is normal. Strength 5/5 in bilateral upper and lower extremities Reflexes:  Right Left   Bicep 2+ 2+   Tricep 2+ 2+   BrRad 2+ 2+   Knee 2+ 2+   Ankle Trace Trace    Pathological Reflexes: Babinski: flexor response bilaterally Hoffman: absent bilaterally Troemner: absent bilaterally Sensation: Pinprick: Intact in upper limbs. Diminished in length dependent fashion in lower limbs Vibration: Intact in upper limbs. Diminished in length dependent fashion in lower limbs Proprioception: Intact in bilateral great toes Coordination: Intact finger-to- nose-finger bilaterally. Romberg negative. Gait: Able to rise from chair with arms crossed unassisted. Normal, narrow-based gait.  Lab and Test Review: External labs: 05/13/23: CMP significant for Cr 1.15, glucose 185 HbA1c: 6.3 Lipid panel: tCholl 143, LDL 77, TG 188 CBC unremarkable  ASSESSMENT: XAIDEN FLEIG is a 68 y.o. male who presents for evaluation of burning and numbness in his feet and restless legs. He has a relevant medical history of DM, HLD, prostate cancer, OSA on CPAP, chronic low back pain, bilateral sensorineural hearing loss, PTSD, insomnia. His neurological examination is pertinent for length dependent sensory loss. Available diagnostic data is significant for HbA1c of 6.3. 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 His known  risk factor for neuropathy is DM. I will send labs for other treatable causes. We discussed medications, but patient does not want to increase medications, preferring to reduce if possible.  PLAN: -Blood work: B1, B6, B12, MMA, IFE, iron studies -Gabapentin 400 mg at bedtime (patient preferred not to change) -Lidocaine  cream PRN -Alpha lipoic acid 600 mg once or twice daily  -Return to clinic in 6 months  The impression above as well as the plan as outlined below were extensively discussed with the patient who voiced understanding. All questions were answered to their satisfaction.  When available, results of the above investigations and possible further recommendations will be communicated to the patient via telephone/MyChart. Patient to call office if not contacted after expected testing turnaround time.   Total time spent reviewing records, interview, history/exam, documentation, and coordination of care on day of encounter:  55 min   Thank you for allowing me to participate in patient's care.  If I can answer any additional questions, I would be pleased to do so.  Venetia Potters, MD   CC: Clinic, [place] [address] [facility] [place] [address]  CC: Referring provider: Bonita Derinda RAMAN, MD [address] [place],  [place] [address]

## 2023-09-22 ENCOUNTER — Other Ambulatory Visit

## 2023-09-22 ENCOUNTER — Ambulatory Visit (INDEPENDENT_AMBULATORY_CARE_PROVIDER_SITE_OTHER): Admitting: Neurology

## 2023-09-22 ENCOUNTER — Encounter: Payer: Self-pay | Admitting: Neurology

## 2023-09-22 VITALS — BP 114/69 | HR 62 | Ht 68.0 in | Wt 193.0 lb

## 2023-09-22 DIAGNOSIS — G2581 Restless legs syndrome: Secondary | ICD-10-CM | POA: Diagnosis not present

## 2023-09-22 DIAGNOSIS — G629 Polyneuropathy, unspecified: Secondary | ICD-10-CM | POA: Diagnosis not present

## 2023-09-22 DIAGNOSIS — E1142 Type 2 diabetes mellitus with diabetic polyneuropathy: Secondary | ICD-10-CM | POA: Diagnosis not present

## 2023-09-22 NOTE — Patient Instructions (Signed)
 I saw you today for neuropathy and restless legs.   I want to check lab work today to look for other treatable causes besides diabetes. I will be in touch when I have your results.  You wanted to not change gabapentin today, so continue 400 mg at bedtime.  You can also try Lidocaine  cream as needed. Apply wear you have pain, tingling, or burning. Wear gloves to prevent your hands being numb. This can be bought over the counter at any drug store or online.  Alpha lipoic acid 600mg  daily has some research data suggesting it helps with nerve health. No major side effects other than <1% of people report upset stomach. This can be taken twice per day (1200mg  daily) if no relief obtained. You can buy this over the counter or online.  I will see you back in clinic in about 6 months. Please let me know if you have any questions or concerns in the meantime.  The physicians and staff at [facility] are committed to providing excellent care. You may receive a survey requesting feedback about your experience at our office. We strive to receive very good responses to the survey questions. If you feel that your experience would prevent you from giving the office a very good  response, please contact our office to try to remedy the situation. We may be reached at [phone]. Thank you for taking the time out of your busy day to complete the survey.  Venetia Potters, MD [facility]

## 2023-09-27 LAB — METHYLMALONIC ACID, SERUM: Methylmalonic Acid, Quant: 99 nmol/L (ref 69–390)

## 2023-09-27 LAB — IMMUNOFIXATION ELECTROPHORESIS
IgG (Immunoglobin G), Serum: 875 mg/dL (ref 600–1540)
IgM, Serum: 171 mg/dL (ref 50–300)
Immunoglobulin A: 256 mg/dL (ref 70–320)

## 2023-09-27 LAB — IRON,TIBC AND FERRITIN PANEL
%SAT: 33 % (ref 20–48)
Ferritin: 160 ng/mL (ref 24–380)
Iron: 115 ug/dL (ref 50–180)
TIBC: 347 ug/dL (ref 250–425)

## 2023-09-27 LAB — VITAMIN B1: Vitamin B1 (Thiamine): 37 nmol/L — ABNORMAL HIGH (ref 8–30)

## 2023-09-27 LAB — VITAMIN B6: Vitamin B6: 49.2 ng/mL — ABNORMAL HIGH (ref 2.1–21.7)

## 2023-09-27 LAB — VITAMIN B12: Vitamin B-12: >2000 pg/mL — ABNORMAL HIGH (ref 200–1100)

## 2023-09-28 ENCOUNTER — Ambulatory Visit: Payer: Self-pay | Admitting: Neurology

## 2023-12-01 ENCOUNTER — Ambulatory Visit: Admitting: Neurology

## 2024-03-28 ENCOUNTER — Ambulatory Visit: Admitting: Neurology
